# Patient Record
Sex: Male | Born: 1935 | Race: White | Hispanic: No | Smoking: Current every day smoker
Health system: Southern US, Community
[De-identification: ages and names within clinical notes are randomized; demographics above are authoritative.]

## PROBLEM LIST (undated history)

## (undated) ENCOUNTER — Emergency Department: Discharge status: Absent without leave | Payer: Medicare Other

## (undated) DIAGNOSIS — I779 Disorder of arteries and arterioles, unspecified: Secondary | ICD-10-CM

## (undated) DIAGNOSIS — J449 Chronic obstructive pulmonary disease, unspecified: Secondary | ICD-10-CM

## (undated) DIAGNOSIS — K219 Gastro-esophageal reflux disease without esophagitis: Secondary | ICD-10-CM

## (undated) DIAGNOSIS — I471 Supraventricular tachycardia, unspecified: Secondary | ICD-10-CM

## (undated) DIAGNOSIS — I70209 Unspecified atherosclerosis of native arteries of extremities, unspecified extremity: Secondary | ICD-10-CM

## (undated) DIAGNOSIS — I719 Aortic aneurysm of unspecified site, without rupture: Secondary | ICD-10-CM

## (undated) DIAGNOSIS — R51 Headache: Secondary | ICD-10-CM

## (undated) DIAGNOSIS — N400 Enlarged prostate without lower urinary tract symptoms: Secondary | ICD-10-CM

## (undated) DIAGNOSIS — F32A Depression, unspecified: Secondary | ICD-10-CM

## (undated) DIAGNOSIS — I714 Abdominal aortic aneurysm, without rupture: Secondary | ICD-10-CM

## (undated) DIAGNOSIS — Z8673 Personal history of transient ischemic attack (TIA), and cerebral infarction without residual deficits: Secondary | ICD-10-CM

## (undated) DIAGNOSIS — I10 Essential (primary) hypertension: Secondary | ICD-10-CM

## (undated) DIAGNOSIS — I739 Peripheral vascular disease, unspecified: Secondary | ICD-10-CM

## (undated) DIAGNOSIS — I2510 Atherosclerotic heart disease of native coronary artery without angina pectoris: Secondary | ICD-10-CM

## (undated) DIAGNOSIS — F329 Major depressive disorder, single episode, unspecified: Secondary | ICD-10-CM

## (undated) DIAGNOSIS — F419 Anxiety disorder, unspecified: Secondary | ICD-10-CM

## (undated) DIAGNOSIS — H543 Unqualified visual loss, both eyes: Secondary | ICD-10-CM

## (undated) HISTORY — PX: CAROTID ENDARTERECTOMY: SUR193

## (undated) HISTORY — PX: EYE SURGERY: SHX253

## (undated) HISTORY — PX: CHOLECYSTECTOMY: SHX55

## (undated) HISTORY — PX: CORONARY ARTERY BYPASS GRAFT: SHX141

## (undated) HISTORY — PX: TRANSURETHRAL RESECTION OF PROSTATE: SHX73

---

## 1998-06-07 ENCOUNTER — Encounter: Payer: Self-pay | Admitting: *Deleted

## 1998-06-07 ENCOUNTER — Ambulatory Visit (HOSPITAL_COMMUNITY): Admission: RE | Admit: 1998-06-07 | Discharge: 1998-06-07 | Payer: Self-pay | Admitting: *Deleted

## 2001-06-12 ENCOUNTER — Emergency Department (HOSPITAL_COMMUNITY): Admission: EM | Admit: 2001-06-12 | Discharge: 2001-06-12 | Payer: Self-pay | Admitting: *Deleted

## 2002-05-27 ENCOUNTER — Inpatient Hospital Stay (HOSPITAL_COMMUNITY): Admission: AD | Admit: 2002-05-27 | Discharge: 2002-05-30 | Payer: Self-pay | Admitting: Family Medicine

## 2002-05-28 ENCOUNTER — Encounter: Payer: Self-pay | Admitting: Cardiology

## 2003-02-27 ENCOUNTER — Encounter: Payer: Self-pay | Admitting: Family Medicine

## 2003-02-27 ENCOUNTER — Inpatient Hospital Stay (HOSPITAL_COMMUNITY): Admission: AD | Admit: 2003-02-27 | Discharge: 2003-03-06 | Payer: Self-pay | Admitting: Family Medicine

## 2003-03-02 ENCOUNTER — Encounter: Payer: Self-pay | Admitting: Family Medicine

## 2003-11-03 ENCOUNTER — Ambulatory Visit (HOSPITAL_COMMUNITY): Admission: RE | Admit: 2003-11-03 | Discharge: 2003-11-03 | Payer: Self-pay | Admitting: *Deleted

## 2004-10-29 ENCOUNTER — Emergency Department (HOSPITAL_COMMUNITY): Admission: EM | Admit: 2004-10-29 | Discharge: 2004-10-29 | Payer: Self-pay | Admitting: Emergency Medicine

## 2004-11-04 ENCOUNTER — Ambulatory Visit: Payer: Self-pay | Admitting: Internal Medicine

## 2004-11-07 ENCOUNTER — Ambulatory Visit (HOSPITAL_COMMUNITY): Admission: RE | Admit: 2004-11-07 | Discharge: 2004-11-07 | Payer: Self-pay | Admitting: Internal Medicine

## 2004-11-22 ENCOUNTER — Ambulatory Visit: Payer: Self-pay | Admitting: Internal Medicine

## 2004-11-22 ENCOUNTER — Ambulatory Visit (HOSPITAL_COMMUNITY): Admission: RE | Admit: 2004-11-22 | Discharge: 2004-11-22 | Payer: Self-pay | Admitting: Internal Medicine

## 2006-05-01 ENCOUNTER — Ambulatory Visit: Payer: Self-pay | Admitting: Internal Medicine

## 2006-07-13 ENCOUNTER — Emergency Department (HOSPITAL_COMMUNITY): Admission: EM | Admit: 2006-07-13 | Discharge: 2006-07-13 | Payer: Self-pay | Admitting: Emergency Medicine

## 2007-01-31 ENCOUNTER — Ambulatory Visit: Payer: Self-pay | Admitting: *Deleted

## 2007-06-13 ENCOUNTER — Ambulatory Visit (HOSPITAL_COMMUNITY): Admission: RE | Admit: 2007-06-13 | Discharge: 2007-06-13 | Payer: Self-pay | Admitting: Family Medicine

## 2007-07-11 HISTORY — PX: CATARACT EXTRACTION: SUR2

## 2007-07-18 ENCOUNTER — Ambulatory Visit (HOSPITAL_COMMUNITY): Admission: RE | Admit: 2007-07-18 | Discharge: 2007-07-18 | Payer: Self-pay | Admitting: Ophthalmology

## 2008-07-23 ENCOUNTER — Ambulatory Visit: Payer: Self-pay | Admitting: *Deleted

## 2008-08-11 ENCOUNTER — Emergency Department (HOSPITAL_COMMUNITY): Admission: EM | Admit: 2008-08-11 | Discharge: 2008-08-11 | Payer: Self-pay | Admitting: Emergency Medicine

## 2008-08-17 ENCOUNTER — Emergency Department (HOSPITAL_COMMUNITY): Admission: EM | Admit: 2008-08-17 | Discharge: 2008-08-17 | Payer: Self-pay | Admitting: Emergency Medicine

## 2008-08-24 ENCOUNTER — Encounter (INDEPENDENT_AMBULATORY_CARE_PROVIDER_SITE_OTHER): Payer: Self-pay | Admitting: Family Medicine

## 2008-08-24 ENCOUNTER — Ambulatory Visit (HOSPITAL_COMMUNITY): Admission: RE | Admit: 2008-08-24 | Discharge: 2008-08-24 | Payer: Self-pay | Admitting: Family Medicine

## 2008-08-24 ENCOUNTER — Ambulatory Visit: Payer: Self-pay | Admitting: Cardiology

## 2008-08-31 ENCOUNTER — Ambulatory Visit (HOSPITAL_COMMUNITY): Admission: RE | Admit: 2008-08-31 | Discharge: 2008-08-31 | Payer: Self-pay | Admitting: Family Medicine

## 2008-09-25 ENCOUNTER — Encounter (INDEPENDENT_AMBULATORY_CARE_PROVIDER_SITE_OTHER): Payer: Self-pay | Admitting: Diagnostic Radiology

## 2008-09-25 ENCOUNTER — Ambulatory Visit (HOSPITAL_COMMUNITY): Admission: RE | Admit: 2008-09-25 | Discharge: 2008-09-25 | Payer: Self-pay | Admitting: Family Medicine

## 2009-01-21 ENCOUNTER — Ambulatory Visit: Payer: Self-pay | Admitting: *Deleted

## 2009-06-21 ENCOUNTER — Ambulatory Visit (HOSPITAL_COMMUNITY): Admission: RE | Admit: 2009-06-21 | Discharge: 2009-06-21 | Payer: Self-pay | Admitting: Family Medicine

## 2009-06-29 ENCOUNTER — Ambulatory Visit: Payer: Self-pay | Admitting: Vascular Surgery

## 2009-08-17 ENCOUNTER — Ambulatory Visit: Payer: Self-pay | Admitting: Vascular Surgery

## 2009-12-21 ENCOUNTER — Encounter: Admission: RE | Admit: 2009-12-21 | Discharge: 2009-12-21 | Payer: Self-pay | Admitting: Vascular Surgery

## 2009-12-21 ENCOUNTER — Ambulatory Visit: Payer: Self-pay | Admitting: Vascular Surgery

## 2009-12-26 ENCOUNTER — Emergency Department (HOSPITAL_COMMUNITY): Admission: EM | Admit: 2009-12-26 | Discharge: 2009-12-26 | Payer: Self-pay | Admitting: Emergency Medicine

## 2010-01-25 ENCOUNTER — Ambulatory Visit: Payer: Self-pay | Admitting: Vascular Surgery

## 2010-01-28 ENCOUNTER — Ambulatory Visit: Payer: Self-pay | Admitting: Vascular Surgery

## 2010-01-28 ENCOUNTER — Encounter: Payer: Self-pay | Admitting: Vascular Surgery

## 2010-01-28 ENCOUNTER — Inpatient Hospital Stay (HOSPITAL_COMMUNITY): Admission: RE | Admit: 2010-01-28 | Discharge: 2010-01-29 | Payer: Self-pay | Admitting: Vascular Surgery

## 2010-02-22 ENCOUNTER — Ambulatory Visit: Payer: Self-pay | Admitting: Vascular Surgery

## 2010-03-07 ENCOUNTER — Emergency Department (HOSPITAL_COMMUNITY): Admission: EM | Admit: 2010-03-07 | Discharge: 2010-03-07 | Payer: Self-pay | Admitting: Emergency Medicine

## 2010-03-17 DIAGNOSIS — D508 Other iron deficiency anemias: Secondary | ICD-10-CM | POA: Insufficient documentation

## 2010-03-17 DIAGNOSIS — K449 Diaphragmatic hernia without obstruction or gangrene: Secondary | ICD-10-CM | POA: Insufficient documentation

## 2010-03-17 DIAGNOSIS — Z8719 Personal history of other diseases of the digestive system: Secondary | ICD-10-CM

## 2010-05-02 ENCOUNTER — Encounter (INDEPENDENT_AMBULATORY_CARE_PROVIDER_SITE_OTHER): Payer: Self-pay | Admitting: *Deleted

## 2010-06-07 ENCOUNTER — Ambulatory Visit: Payer: Self-pay | Admitting: Vascular Surgery

## 2010-08-11 NOTE — Letter (Signed)
Summary: Generic Letter, Intro to Referring  [facility]  [address]   [place], [place] [address]   Phone: [phone]  Fax: [phone]      May 02, 2010             RE: MCKALE HAFFEY   1928/10/02                 [address]                 [place], [place]  [address]  Dear Kemper Durie,  Mr Aristides Luckey has cancelled his appointments for 9/12 @ 2pm, 9/26 @ 3 pm and was a NO SHOW for today.            Sincerely,    Diana Eves  [facility] Ph: [phone]   Fax: [phone]

## 2010-08-27 ENCOUNTER — Emergency Department (HOSPITAL_COMMUNITY)
Admission: EM | Admit: 2010-08-27 | Discharge: 2010-08-27 | Disposition: A | Discharge status: Home / Self Care | Payer: Medicare Other | Attending: Emergency Medicine | Admitting: Emergency Medicine

## 2010-08-27 DIAGNOSIS — E7800 Pure hypercholesterolemia, unspecified: Secondary | ICD-10-CM | POA: Insufficient documentation

## 2010-08-27 DIAGNOSIS — I10 Essential (primary) hypertension: Secondary | ICD-10-CM | POA: Insufficient documentation

## 2010-08-27 DIAGNOSIS — K5900 Constipation, unspecified: Secondary | ICD-10-CM | POA: Insufficient documentation

## 2010-08-27 DIAGNOSIS — I2510 Atherosclerotic heart disease of native coronary artery without angina pectoris: Secondary | ICD-10-CM | POA: Insufficient documentation

## 2010-08-27 DIAGNOSIS — Z8673 Personal history of transient ischemic attack (TIA), and cerebral infarction without residual deficits: Secondary | ICD-10-CM | POA: Insufficient documentation

## 2010-08-27 DIAGNOSIS — H409 Unspecified glaucoma: Secondary | ICD-10-CM | POA: Insufficient documentation

## 2010-08-27 DIAGNOSIS — Z951 Presence of aortocoronary bypass graft: Secondary | ICD-10-CM | POA: Insufficient documentation

## 2010-09-22 LAB — COMPREHENSIVE METABOLIC PANEL
Alkaline Phosphatase: 70 U/L (ref 39–117)
BUN: 10 mg/dL (ref 6–23)
CO2: 25 mEq/L (ref 19–32)
Chloride: 111 mEq/L (ref 96–112)
Glucose, Bld: 100 mg/dL — ABNORMAL HIGH (ref 70–99)
Potassium: 3.6 mEq/L (ref 3.5–5.1)
Total Bilirubin: 0.6 mg/dL (ref 0.3–1.2)

## 2010-09-22 LAB — CBC
HCT: 34.3 % — ABNORMAL LOW (ref 39.0–52.0)
Hemoglobin: 11.1 g/dL — ABNORMAL LOW (ref 13.0–17.0)
MCV: 85.3 fL (ref 78.0–100.0)
RBC: 4.02 MIL/uL — ABNORMAL LOW (ref 4.22–5.81)
WBC: 7.3 10*3/uL (ref 4.0–10.5)

## 2010-09-22 LAB — URINALYSIS, ROUTINE W REFLEX MICROSCOPIC
Bilirubin Urine: NEGATIVE
Hgb urine dipstick: NEGATIVE
Ketones, ur: NEGATIVE mg/dL
Specific Gravity, Urine: 1.02 (ref 1.005–1.030)
pH: 6 (ref 5.0–8.0)

## 2010-09-22 LAB — DIFFERENTIAL
Basophils Absolute: 0 10*3/uL (ref 0.0–0.1)
Lymphs Abs: 1 10*3/uL (ref 0.7–4.0)
Monocytes Absolute: 0.4 10*3/uL (ref 0.1–1.0)
Neutro Abs: 5.8 10*3/uL (ref 1.7–7.7)

## 2010-09-24 LAB — CBC
HCT: 26.6 % — ABNORMAL LOW (ref 39.0–52.0)
Hemoglobin: 8.5 g/dL — ABNORMAL LOW (ref 13.0–17.0)
MCH: 24.1 pg — ABNORMAL LOW (ref 26.0–34.0)
MCHC: 31 g/dL (ref 30.0–36.0)
MCHC: 32 g/dL (ref 30.0–36.0)
MCV: 69.5 fL — ABNORMAL LOW (ref 78.0–100.0)
Platelets: 141 10*3/uL — ABNORMAL LOW (ref 150–400)
RDW: 22.1 % — ABNORMAL HIGH (ref 11.5–15.5)
RDW: 22.9 % — ABNORMAL HIGH (ref 11.5–15.5)
WBC: 6.4 10*3/uL (ref 4.0–10.5)

## 2010-09-24 LAB — COMPREHENSIVE METABOLIC PANEL
Albumin: 3.8 g/dL (ref 3.5–5.2)
Alkaline Phosphatase: 71 U/L (ref 39–117)
BUN: 13 mg/dL (ref 6–23)
Calcium: 8.7 mg/dL (ref 8.4–10.5)
Potassium: 3.9 mEq/L (ref 3.5–5.1)
Sodium: 137 mEq/L (ref 135–145)
Total Protein: 6.1 g/dL (ref 6.0–8.3)

## 2010-09-24 LAB — CROSSMATCH

## 2010-09-24 LAB — URINALYSIS, ROUTINE W REFLEX MICROSCOPIC
Bilirubin Urine: NEGATIVE
Ketones, ur: NEGATIVE mg/dL
Nitrite: NEGATIVE
Protein, ur: NEGATIVE mg/dL
Urobilinogen, UA: 0.2 mg/dL (ref 0.0–1.0)
pH: 5.5 (ref 5.0–8.0)

## 2010-09-24 LAB — PROTIME-INR
INR: 1.25 (ref 0.00–1.49)
Prothrombin Time: 15.6 seconds — ABNORMAL HIGH (ref 11.6–15.2)

## 2010-09-24 LAB — POCT I-STAT 4, (NA,K, GLUC, HGB,HCT)
Glucose, Bld: 122 mg/dL — ABNORMAL HIGH (ref 70–99)
HCT: 26 % — ABNORMAL LOW (ref 39.0–52.0)
HCT: 27 % — ABNORMAL LOW (ref 39.0–52.0)
Hemoglobin: 8.8 g/dL — ABNORMAL LOW (ref 13.0–17.0)
Sodium: 139 mEq/L (ref 135–145)
Sodium: 141 mEq/L (ref 135–145)

## 2010-09-24 LAB — SURGICAL PCR SCREEN
MRSA, PCR: NEGATIVE
Staphylococcus aureus: NEGATIVE

## 2010-09-24 LAB — BASIC METABOLIC PANEL
BUN: 7 mg/dL (ref 6–23)
CO2: 21 mEq/L (ref 19–32)
Calcium: 8 mg/dL — ABNORMAL LOW (ref 8.4–10.5)
Glucose, Bld: 120 mg/dL — ABNORMAL HIGH (ref 70–99)
Potassium: 3.4 mEq/L — ABNORMAL LOW (ref 3.5–5.1)
Sodium: 139 mEq/L (ref 135–145)

## 2010-10-25 LAB — DIFFERENTIAL
Basophils Absolute: 0 10*3/uL (ref 0.0–0.1)
Lymphocytes Relative: 14 % (ref 12–46)
Lymphocytes Relative: 17 % (ref 12–46)
Lymphs Abs: 1 10*3/uL (ref 0.7–4.0)
Lymphs Abs: 1.3 10*3/uL (ref 0.7–4.0)
Monocytes Absolute: 0.6 10*3/uL (ref 0.1–1.0)
Neutro Abs: 5.5 10*3/uL (ref 1.7–7.7)
Neutrophils Relative %: 78 % — ABNORMAL HIGH (ref 43–77)

## 2010-10-25 LAB — URINALYSIS, ROUTINE W REFLEX MICROSCOPIC
Bilirubin Urine: NEGATIVE
Glucose, UA: NEGATIVE mg/dL
Glucose, UA: NEGATIVE mg/dL
Hgb urine dipstick: NEGATIVE
Leukocytes, UA: NEGATIVE
Nitrite: NEGATIVE
Protein, ur: NEGATIVE mg/dL
Specific Gravity, Urine: 1.02 (ref 1.005–1.030)
Urobilinogen, UA: 0.2 mg/dL (ref 0.0–1.0)
pH: 7 (ref 5.0–8.0)

## 2010-10-25 LAB — COMPREHENSIVE METABOLIC PANEL
Albumin: 3.9 g/dL (ref 3.5–5.2)
BUN: 17 mg/dL (ref 6–23)
Calcium: 9.1 mg/dL (ref 8.4–10.5)
Creatinine, Ser: 0.9 mg/dL (ref 0.4–1.5)
GFR calc Af Amer: >60 mL/min (ref >60)
Total Bilirubin: 0.4 mg/dL (ref 0.3–1.2)
Total Protein: 6.7 g/dL (ref 6.0–8.3)

## 2010-10-25 LAB — BASIC METABOLIC PANEL
BUN: 19 mg/dL (ref 6–23)
Creatinine, Ser: 1.27 mg/dL (ref 0.4–1.5)
GFR calc non Af Amer: 55 mL/min — ABNORMAL LOW (ref >60)
Potassium: 4 mEq/L (ref 3.5–5.1)

## 2010-10-25 LAB — URINE MICROSCOPIC-ADD ON

## 2010-10-25 LAB — CBC
HCT: 32.1 % — ABNORMAL LOW (ref 39.0–52.0)
MCHC: 32.4 g/dL (ref 30.0–36.0)
MCV: 70.9 fL — ABNORMAL LOW (ref 78.0–100.0)
Platelets: 141 10*3/uL — ABNORMAL LOW (ref 150–400)
Platelets: 181 10*3/uL (ref 150–400)
RDW: 20.5 % — ABNORMAL HIGH (ref 11.5–15.5)
WBC: 7.3 10*3/uL (ref 4.0–10.5)

## 2010-11-22 NOTE — Procedures (Signed)
DUPLEX ULTRASOUND OF ABDOMINAL AORTA   INDICATION:  Follow up abdominal aortic aneurysm.   HISTORY:  Diabetes:  No.  Cardiac:  CABG in 1998.  Hypertension:  Yes.  Smoking:  Yes.  Connective Tissue Disorder:  Family History:  No.  Previous Surgery:  No.   DUPLEX EXAM:         AP (cm)                   TRANSVERSE (cm)  Proximal             2.13 cm                   1.99 cm  Mid                  4.6 cm                    4.9 cm  Distal               2.91 cm                   3.28 cm  Right Iliac          0.74 cm                   0.89 cm  Left Iliac           0.95 cm                   0.91 cm   PREVIOUS:  Date:  AP:  4.29  TRANSVERSE:  4.57   IMPRESSION:  There is an abdominal aortic aneurysm with the largest  measurement of 4.6 cm X 4.9 cm.  This is an increase from previous  study.   ___________________________________________  Quita Skye Hart [place], M.D.   CB/MEDQ  D:  06/29/2009  T:  06/30/2009  Job:  [id number]

## 2010-11-22 NOTE — Assessment & Plan Note (Signed)
OFFICE VISIT   [name], [name]  DOB:  1929-03-07                                       06/07/2010  [id number]#:[id number]   The patient returns today for further discussion regarding his  infrarenal abdominal aortic aneurysm.  The aneurysm measures 54 mm in  maximum diameter and he has severe iliac occlusive disease.  He is a  poor candidate for stent graft and will require open repair.  Since I  last saw him 3 months ago he has lost significant weight and also has  lost the remaining vision in his left eye.  He was blind in his right  eye previously.  He has been having some lower abdominal discomfort and  is being evaluated by Dr. Ishmael Holter. McInnis for these problems as well as  urinary frequency.   CHRONIC MEDICAL PROBLEMS:  1. Hypertension.  2. Hyperlipidemia.  3. Coronary artery disease, previous coronary bypass grafting in 1998.  4. Chronic blindness.   SOCIAL HISTORY:  The patient smokes 1-1/2 to 2 packs of cigarettes per  day.  Retired.  Does not use alcohol.   REVIEW OF SYSTEMS:  Continues to deny any chest pain but has a very poor  appetite.  Had lost from 165 he says down to 125 pounds recently.  He is  being evaluated by Dr. Renard Matter for this.  Also complains of headaches,  productive cough, leg pain with walking, history of chest tightness on  occasion, melenic stool, constipation.  All other systems are negative  in review of systems.   PHYSICAL EXAM:  Vital signs:  Blood pressure 122/60, heart rate 80,  respirations 14.  General:  He is a chronically ill, emaciated appearing  male who is in no apparent distress.  He is alert and oriented x3.  Appears to be blind.  HEENT:  Reveals absent vision.  Lungs:  Clear.  No  rhonchi or wheezing.  Cardiovascular:  Regular rhythm, no murmurs.  Carotid pulses 3+, no bruits.  Abdomen:  Soft, nontender with a 5 cm  pulsatile mass in mid epigastrium.  Abdomen:  Scaphoid.  He has 1 to 2+  femoral pulses  bilaterally.   I think at the present time he is much too weak to undergo elective  resection and grafting of his aneurysm and I am not certain that he will  be strong enough to do this in the future.  We will need to follow him  and see what the size of the aneurysm does and his general strength  level.  He will return to see me in 6 months with a duplex scan of his  aneurysm and a carotid duplex to follow up his recent right carotid  surgery.  Weight today is 118 pounds.  He is to be seeing Dr. Renard Matter  for further followup in the near future.     Quita Skye Hart [place], M.D.  Electronically Signed   JDL/MEDQ  D:  06/07/2010  T:  06/07/2010  Job:  [id number]

## 2010-11-22 NOTE — Assessment & Plan Note (Signed)
OFFICE VISIT   [name], [name]  DOB:  1928-08-10                                       02/22/2010  DVVOH#:[id number]   The patient returns today for initial followup regarding his right  carotid endarterectomy which I performed July 22 for severe but  asymptomatic right internal carotid stenosis.  He has done very well  from that standpoint with no neurologic complications.  He has had no  hemiparesis, aphasia, amaurosis fugax, diplopia, blurred vision or  syncope.  He has not been eating well according to the family.  He has  not had any nausea or vomiting but has had some mild abdominal  discomfort periodically but nothing consistent.  He has been taking some  Ensure.   PHYSICAL EXAM:  Today blood pressure 124/66, heart rate 79, respirations  18.  His weight today is 123.7 pounds.  Right neck incision has healed  nicely.  Neurologic exam normal.  Abdomen is soft with no tenderness.  He has a 5 cm pulsatile mass, 2-3+ femoral pulses bilaterally.   I think we should wait a couple of months for him to get some strength  back before proceeding with his aneurysm resection which will require  open surgery and aortobifemoral bypass graft.  I asked the family to get  in touch with Dr. Renard Matter to help with regarding his strength and  appetite preoperatively.  He will return in 2 months for further  examination and hopefully we will be able to schedule his aneurysm  surgery at that time.     Quita Skye Hart [place], M.D.  Electronically Signed   JDL/MEDQ  D:  02/22/2010  T:  02/23/2010  Job:  [id number]

## 2010-11-22 NOTE — Procedures (Signed)
CAROTID DUPLEX EXAM   INDICATION:  Follow-up evaluation of known carotid artery disease.   HISTORY:  Diabetes:  No.  Cardiac:  Coronary artery disease.  Coronary artery bypass graft in 1998  by Dr. Cornelius Moras.  Hypertension:  Yes.  Smoking:  Pack per day for over 60 years.  Previous Surgery:  No.  CV History:  Previous duplex on 01/31/07 revealed 20-39% left ICA  stenosis and 60-79% right ICA stenosis and an occluded left vertebral  artery.  Patient has no vision in his right eye.  Amaurosis Fugax No, Paresthesias No, Hemiparesis No.                                       RIGHT             LEFT  Brachial systolic pressure:         112               118  Brachial Doppler waveforms:         Triphasic         Triphasic  Vertebral direction of flow:        Antegrade         Occluded  DUPLEX VELOCITIES (cm/sec)  CCA peak systolic                   80                80  ECA peak systolic                   111               127  ICA peak systolic                   330 mid           125  ICA end diastolic                   74 mid            30  PLAQUE MORPHOLOGY:                  Calcified, irregular                Mixed  PLAQUE AMOUNT:                      Moderate          Mild  PLAQUE LOCATION:                    Proximal ICA      Proximal-to-mid  ICA   IMPRESSION:  1. Occluded left vertebral artery.  2. 60-79% right internal carotid artery stenosis.  3. 20-39% left internal carotid artery stenosis.  4. No significant change from previous study.   ___________________________________________  P. Liliane Bade, M.D.   MC/MEDQ  D:  07/23/2008  T:  07/24/2008  Job:  [id number]

## 2010-11-22 NOTE — Assessment & Plan Note (Signed)
OFFICE VISIT   [name], CUADROS  DOB:  04-27-1929                                       12/21/2009  UXLKG#:[id number]   The patient returns today for further evaluation of his carotid  occlusive disease and his abdominal aortic aneurysm.  He denies any  active hemispheric or nonhemispheric TIAs, amaurosis fugax, diplopia,  blurred vision or syncope.  He is scheduled to have surgery on his left  eye tomorrow at [facility].  He has no vision in his right eye.  He has not had any abdominal or back symptoms.  Denies any chest pain,  dyspnea on exertion.   CHRONIC MEDICAL PROBLEMS:  1. Hypertension.  2. Hyperlipidemia.  3. Coronary artery disease, previous coronary bypass graft in 1998.  4. Iliac occlusive disease.   SOCIAL HISTORY:  The patient continues to smoke 1-1/2 to 2 packs  cigarettes per day.  He is retired.  Does not use alcohol.   REVIEW OF SYSTEMS:  Review of systems as noted is having no active  cardiac symptoms, chest pain, dyspnea on exertion, PND, orthopnea.   PHYSICAL EXAMINATION:  Blood pressure today is 131/63, heart rate 79,  temperature 98.  Generally he is a chronically ill, thin male who is in  no apparent distress, alert and oriented times 3.  HEENT exam reveals no  vision in the right eye, poor vision in his left eye grossly.  Extraocular muscles intact.  Lungs clear to auscultation.  No wheezing.  Cardiovascular exam reveals a regular rhythm, no murmurs.  Carotid  pulses are 3+ with soft bruit on the right.  Abdomen is soft, nontender  with a pulsatile mass measuring about 5 cm.  Musculoskeletal exam is  free of deformities.  Lower extremity exam reveals 3+ femoral pulses  bilaterally.   Today I ordered carotid duplex exam which revealed progression of  disease on the right side to 80% in severity with mild to moderate left  internal carotid stenosis.  I also ordered a CT angiogram today with  contrast to assess his aneurysm.   The aneurysm now measures 5.3 cm in  maximum diameter and does appear to be a candidate for aortic stent  grafting with a neck measuring about 13-15 mm in length.  Also reveals a  possible mass in the upper pole of the left kidney.   PLAN:  1. A Cardiolite study to see his current cardiac status.  2. Urology consult to evaluate the possible mass in the left upper      pole of his left kidney.  3. He will then need right carotid endarterectomy performed.  4. Treatment of his aneurysm with possible stent grafting and possible      open repair most likely aortic stent graft.   He will return in 4 weeks after the above have been performed.     Quita Skye Hart [place], M.D.  Electronically Signed   JDL/MEDQ  D:  12/21/2009  T:  12/22/2009  Job:  [id number]

## 2010-11-22 NOTE — Assessment & Plan Note (Signed)
OFFICE VISIT   CAMRON, MONDAY  DOB:  September 22, 1928                                       01/31/2007  [id number]#:[id number]   Mr. Christofferson returns to the office today for continued surveillance of his  carotid disease and a small abdominal aortic aneurysm.   Abdominal ultrasound carried out today. This reveals his aneurysm to be  4.2 cm in maximal diameter, mild increase in size, not significant. His  carotid Doppler evaluation reveals minimal left carotid disease. He does  have 60% to 79% right ICA stenosis with peak systolic velocity 328 cm  per second.   Continues to smoke 1 pack of cigarettes per day. History of  hypertension, coronary artery disease, and previous coronary artery  bypass.   Complains of some chronic back pain. No abdominal pain. No visual  disturbance, sensory, or motor deficit.   No carotid bruits. Heart sounds are normal without murmur. Chest is  clear without rales or rhonchi. Abdomen soft and nontender. No masses or  organomegaly. Two + femoral pulses bilaterally.   Mr. Crumpler does show some progression of his carotid disease along with  mild enlargement of his triple A. We will plan follow up again in 6  months with an ultrasound of the abdomen and carotid Doppler evaluation.  He was counseled regarding smoking cessation.   Balinda Quails, M.D.  Electronically Signed   PGH/MEDQ  D:  01/31/2007  T:  02/01/2007  Job:  [id number]   cc:   Angus G. Renard Matter, MD

## 2010-11-22 NOTE — Procedures (Signed)
CAROTID DUPLEX EXAM   INDICATION:  Follow up carotid artery disease.   HISTORY:  Diabetes:  No.  Cardiac:  Coronary artery disease, CABG in 1998 by Dr. Cornelius Moras.  Hypertension:  Yes.  Smoking:  One pack per day for more than 60 years.  Previous Surgery:  CV History:  Amaurosis Fugax, No.  Paresthesias, No.  Hemiparesis, No.                                       RIGHT             LEFT  Brachial systolic pressure:         140.              134.  Brachial Doppler waveforms:           Triphasic         Triphasic  Vertebral direction of flow:        Antegrade.        Not detected.  DUPLEX VELOCITIES (cm/sec)  CCA peak systolic                   65.               83.  ECA peak systolic                   72.               86.  ICA peak systolic                   328 mid.          67.  ICA end diastolic                   84.               29.  PLAQUE MORPHOLOGY:                  Calcified.        Mixed.  PLAQUE AMOUNT:                      Large.            Mild.  PLAQUE LOCATION:                    Mid ICA.          ICA.   IMPRESSION:  1. 20 to 39% left ICA stenosis.  2. 60 to 79% right ICA stenosis with an increase in velocities from      previous exam.  3. Left vertebral artery not detected, probable occlusion.   ___________________________________________  P. Liliane Bade, M.D.   DP/MEDQ  D:  01/31/2007  T:  01/31/2007  Job:  [id number]

## 2010-11-22 NOTE — Procedures (Signed)
CAROTID DUPLEX EXAM   INDICATION:  Followup carotid artery disease   HISTORY:  Diabetes:  no  Cardiac:  CABG in 1998  Hypertension:  yes  Smoking:  yes  Previous Surgery:  no  CV History:  TIA  Amaurosis Fugax  No, Paresthesias No, Hemiparesis  No                                       RIGHT             LEFT  Brachial systolic pressure:         124               110  Brachial Doppler waveforms:         WNL               WNL  Vertebral direction of flow:        Antegrade         Occluded  DUPLEX VELOCITIES (cm/sec)  CCA peak systolic                   103               109  ECA peak systolic                   151               134  ICA peak systolic                   401               177  ICA end diastolic                   94                38  PLAQUE MORPHOLOGY:                  heterogeneous     heterogeneous  PLAQUE AMOUNT:                      Moderate to severe                  Moderate  PLAQUE LOCATION:                    ICA               ICA   IMPRESSION:  1. Right internal carotid artery suggests 60% to 79% stenosis.  2. Left internal carotid artery suggests 49% stenosis.  3. Known occluded left vertebral.   ___________________________________________  Quita Skye. Hart [place], M.D.   CB/MEDQ  D:  12/21/2009  T:  12/21/2009  Job:  [id number]

## 2010-11-22 NOTE — Assessment & Plan Note (Signed)
OFFICE VISIT   [name], [name]  DOB:  Mar 05, 1929                                       01/25/2010  ZOXWR#:[id number]   The patient returns today for further discussion regarding his abdominal  aortic aneurysm and his severe carotid occlusive disease.  He also has a  questionable lesion in the upper pole of his left kidney and he had  urology appointment Dr. Retta Diones yesterday but because of confusion  about the day, the patient did not show up and is rescheduled for late  August.  Denies hematuria.  He also denies any hemiparesis, aphasia,  amaurosis fugax, diplopia, blurred vision or syncope.  He has an 80%  right internal carotid stenosis and an aneurysm greater than 5 cm in  diameter.  The aneurysm is not good candidate for stent grafting because  of the neck being a reverse taper and also external iliac occlusive  disease.  He has no vision in the right eye.  He has had surgery on his  left eye.   CHRONIC MEDICAL PROBLEMS:  1. Hypertension.  2. Hyperlipidemia.  3. Coronary artery disease, previous coronary bypass grafting in 1998.  4. Iliac occlusive disease.  5. Blindness left eye.   SOCIAL HISTORY:  The patient smokes 1-1/2 to 2 packs of cigarettes per  day, is retired.  Does not use alcohol.   REVIEW OF SYSTEMS:  Negative for chest pain, dyspnea on exertion, PND,  orthopnea.  Does have left hip discomfort and discomfort with  ambulation, unclear whether this is claudication or hip joint problems.  Does have muscle pain.   PHYSICAL EXAMINATION:  Blood pressure 132/84, heart rate 76,  respirations 14.  General:  He is a thin elderly male in no apparent  distress, alert and oriented x3.  HEENT:  Exam reveals no vision in the  right eye, EOM on the left intact.  Lungs:  Clear to auscultation.  No  rhonchi or wheezing.  Cardiovascular:  Regular rhythm, no murmurs.  Carotid pulses 3+ with a high-pitched bruit over the right side.  Abdomen:  Soft  with pulsatile mass exceeding 5 cm in diameter.  He has  2+ femoral pulses bilaterally.   He had a Cardiolite performed at [facility] on December 30, 2009,  which revealed a normal ejection fraction and good ventricular function  with no ischemia.   Plan is to proceed a right carotid endarterectomy this Friday, July 22.  Following recovery, we will proceed with an open resection and grafting  of abdominal aortic aneurysm with insertion of aortobifemoral graft.  Risks and benefits of the carotid surgery have been thoroughly discussed  with the patient and wife and would like proceed.     Quita Skye Hart [place], M.D.  Electronically Signed   JDL/MEDQ  D:  01/25/2010  T:  01/26/2010  Job:  [id number]

## 2010-11-22 NOTE — Procedures (Signed)
CAROTID DUPLEX EXAM   INDICATION:  Followup evaluation of known carotid artery disease.   HISTORY:  Diabetes:  No.  Cardiac:  Coronary artery bypass graft in 1998.  Hypertension:  Yes.  Smoking:  Yes.  Previous Surgery:  No.  CV History:  Mini stroke.  Amaurosis Fugax No, Paresthesias No, Hemiparesis No                                       RIGHT             LEFT  Brachial systolic pressure:         138               132  Brachial Doppler waveforms:         WNL               WNL  Vertebral direction of flow:        Antegrade         Occluded  DUPLEX VELOCITIES (cm/sec)  CCA peak systolic                   97                95  ECA peak systolic                   104               122  ICA peak systolic                   339 (mid)         115  ICA end diastolic                   76                32  PLAQUE MORPHOLOGY:                  Calcified         Heterogeneous  PLAQUE AMOUNT:                      Moderate          Mild  PLAQUE LOCATION:                    Bif/ICA           Bif/ICA   IMPRESSION:  1. 60-79% right internal carotid artery stenosis.  2. 20-39% left internal carotid artery stenosis.  3. Occluded left vertebral artery.   ___________________________________________  P. Liliane Bade, M.D.   AC/MEDQ  D:  01/21/2009  T:  01/21/2009  Job:  [id number]

## 2010-11-22 NOTE — Procedures (Signed)
DUPLEX ULTRASOUND OF ABDOMINAL AORTA   INDICATION:  Follow-up evaluation of abdominal aortic aneurysm.   HISTORY:  Diabetes:  No.  Cardiac:  Coronary artery bypass graft in 1998.  Hypertension:  Yes.  Smoking:  Yes.  Connective Tissue Disorder:  Family History:  No.  Previous Surgery:  No.   DUPLEX EXAM:         AP (cm)                   TRANSVERSE (cm)  Proximal             2.35 cm                   2.57 cm  Mid                  4.29 cm                   4.57 cm  Distal               2.98 cm                   3.2 cm  Right Iliac          1.13 cm                   1.04 cm  Left Iliac           1.07 cm                   0.93 cm   PREVIOUS:  Date: 07/23/2008  AP:  1.4  TRANSVERSE:  4.5   IMPRESSION:  There is no significant change in abdominal aortic aneurysm  compared to previous study.    ___________________________________________  P. Liliane Bade, M.D.   AC/MEDQ  D:  01/21/2009  T:  01/21/2009  Job:  [id number]

## 2010-11-22 NOTE — Assessment & Plan Note (Signed)
OFFICE VISIT   [name], [name]  DOB:  [birth year]                                       06/29/2009  ZOXWR#:[id number]   The patient returns today for continued followup regarding his  infrarenal abdominal aortic aneurysm and right carotid occlusive  disease.  He has been followed by Dr. Madilyn Fireman for many years.  He has had  no abdominal symptoms but has had some mild back discomfort which sounds  chronic in nature.  He also describes bilateral hip and thigh  claudication symptoms after walking a half block which is relieved by  rest.   CHRONIC STABLE MEDICAL PROBLEMS:  1. Include hypertension.  2. Hyperlipidemia.  3. Coronary artery disease.  Previous coronary artery bypass grafting      in 1998.  4. Iliac occlusive disease.  5. Negative for diabetes or stroke.  He denies any hemispheric or      nonhemispheric TIAs, amaurosis fugax, diplopia, blurred vision or      syncope.   PAST SURGICAL HISTORY:  Includes cholecystectomy, appendectomy and  coronary artery bypass grafting.   SOCIAL HISTORY:  The patient continues to smoke 1-1/2 to 2 pack of  cigarettes per day, is retired.  Does not use alcohol.   ALLERGIES:  Penicillin, codeine.   REVIEW OF SYSTEMS:  The patient denies any chest pain, dyspnea on  exertion, PND, orthopnea.  No pulmonary problems.  No weight loss,  anorexia.  All other review of systems are negative.   PHYSICAL EXAMINATION:  Blood pressure 135/67, heart rate 88,  respirations 14, temperature 98.  Generally he is a well-developed, well-  nourished male in no apparent distress, alert and oriented x3.  HEENT  exam unremarkable with the exception of very poor vision, has no vision  in the left eye, poor vision in the right eye.  Chest clear to  auscultation.  Cardiovascular exam regular rhythm, no murmurs.  Abdomen  soft, nontender with 4.5 to 5 cm pulsatile mass.  Musculoskeletal exam  reveals no major deformities.  Neurologic exam  normal.  Lower extremity  exam reveals 1-2+ femoral and 2-3+ posterior tibial pulses bilaterally.   I ordered and reviewed a duplex scan today done in the office which  reveals aneurysm to be 4.9 x 4.6 cm in maximum diameter.  Previous  carotid duplex exam 6 months ago I reviewed as well which revealed  approximate 75%-80% right internal carotid stenosis which is  asymptomatic.   PLAN:  The aneurysm does not require treatment at this time.  He will  return in 6 months for carotid duplex exam and also CT angiogram to see  if he is a candidate for aortic stent graft.  I am concerned about  significant iliac occlusive disease which we will also assess at that  time.     Quita Skye Hart [place], M.D.  Electronically Signed   JDL/MEDQ  D:  06/29/2009  T:  06/30/2009  Job:  [id number]

## 2010-11-22 NOTE — Procedures (Signed)
CAROTID DUPLEX EXAM   INDICATION:  Followup carotid disease.   HISTORY:  Diabetes:  No.  Cardiac:  CABG 1998.  Hypertension:  Yes.  Smoking:  Yes.  Previous Surgery:  No.  CV History:  History of TIA.  Amaurosis Fugax Yes No, Paresthesias Yes No, Hemiparesis Yes No                                       RIGHT             LEFT  Brachial systolic pressure:         160               140  Brachial Doppler waveforms:         Triphasic         Triphasic  Vertebral direction of flow:        Antegrade         Occluded  DUPLEX VELOCITIES (cm/sec)  CCA peak systolic                   72                69  ECA peak systolic                   82                100  ICA peak systolic                   338               148  ICA end diastolic                   78                37  PLAQUE MORPHOLOGY:                  Calcified/mixed   Calcified/mixed  PLAQUE AMOUNT:                      Moderate to severe                  Moderate  PLAQUE LOCATION:                    CCA and ICA       CCA and ICA   IMPRESSION:  1. High-grade 60%-79% stenosis noted in the right internal carotid      artery.  2. 40%-59% stenosis noted in the left internal carotid artery.  3. Left vertebral is occluded.  4. Stable to previous study.   ___________________________________________  [name] [place], M.D.   CJ/MEDQ  D:  08/17/2009  T:  08/18/2009  Job:  [id number]

## 2010-11-22 NOTE — Procedures (Signed)
DUPLEX ULTRASOUND OF ABDOMINAL AORTA   INDICATION:  Followup abdominal aortic aneurysm.  The patient is  complaining of increased back pain.   HISTORY:  Diabetes:  No  Cardiac:  Coronary artery disease, CABG in 1998.  Hypertension:  Yes  Smoking:  One pack per day for more than 60 years.  Connective Tissue Disorder:  Family History:  No  Previous Surgery:   DUPLEX EXAM:         AP (cm)                   TRANSVERSE (cm)  Proximal             2.49 cm                   2.19 cm  Mid                  4.25 cm                   3.51 cm  Distal               2.54 cm                   2.66 cm  Right Iliac          0.74 cm  Left Iliac           1.01 cm   PREVIOUS:  Date: 12/21/2005  AP:  3.71  TRANSVERSE:  3.79   IMPRESSION:  Slight increase in measurements of known abdominal aortic  aneurysm.   ___________________________________________  P. Liliane Bade, M.D.   DP/MEDQ  D:  01/31/2007  T:  01/31/2007  Job:  [id number]

## 2010-11-22 NOTE — Procedures (Signed)
DUPLEX ULTRASOUND OF ABDOMINAL AORTA   INDICATION:  Follow-up evaluation of abdominal aortic aneurysm.  Patient  complains of back pain.   HISTORY:  Diabetes:  No.  Cardiac:  Coronary artery bypass graft in 1998.  Hypertension:  Yes.  Smoking:  Pack per day for over 60 years.  Connective Tissue Disorder:  Family History:  Previous Surgery:   DUPLEX EXAM:         AP (cm)                   TRANSVERSE (cm)  Proximal             2.3 cm                    2.5 cm  Mid                  4.1 cm                    4.5 cm  Distal               3.5 cm                    3.5 cm  Right Iliac          1.0 cm                    1.1 cm  Left Iliac           1.0 cm                    0.9 cm   PREVIOUS:  Date: 01/31/07  AP:  4.3  TRANSVERSE:  3.5   IMPRESSION:  Abdominal aortic aneurysm measurements are stable compared  to previous study.   ___________________________________________  P. Liliane Bade, M.D.   MC/MEDQ  D:  07/23/2008  T:  07/24/2008  Job:  [id number]

## 2010-11-25 NOTE — Discharge Summary (Signed)
   NAME:  [name], [name]                            ACCOUNT NO.:  [id number]   MEDICAL RECORD NO.:  [id number]                   PATIENT TYPE:  INP   LOCATION:  4735                                 FACILITY:  [facility]   PHYSICIAN:  Arturo Morton. Riley Kill, M.D.             DATE OF BIRTH:  Aug 11, 1928   DATE OF ADMISSION:  03/05/2003  DATE OF DISCHARGE:  03/06/2003                           DISCHARGE SUMMARY - REFERRING   ADDENDUM:  Please note the following addendum:  Upon further review of the  patient's previous home medication regimen, it was noted that the patient  was recently started on Plavix while at [facility].  He was also  placed on a nicotine patch.  We will therefore continue these two new  medications, and the patient was provided with prescriptions, as well as his  previous home medication regimen, as previously noted.  Additionally, the  patient has been rescheduled to follow up with Dr. Beulaville Bing on  Tuesday, March 24, 2003 at 1:30 p.m.      Gene Serpe, P.A. [facility]                      Thomas D. Riley Kill, M.D.    GS/MEDQ  D:  03/06/2003  T:  03/06/2003  Job:  [id number]

## 2010-11-25 NOTE — Group Therapy Note (Signed)
   NAME:  [name], NUTTING                            ACCOUNT NO.:  [id number]   MEDICAL RECORD NO.:  [id number]                   PATIENT TYPE:  INP   LOCATION:  A211                                 FACILITY:  [facility]   PHYSICIAN:  Angus G. Renard Matter, M.D.              DATE OF BIRTH:  1928/09/26   DATE OF PROCEDURE:  03/02/2003  DATE OF DISCHARGE:                                   PROGRESS NOTE   SUBJECTIVE:  This patient was admitted to the hospital with atypical  headache which has occurred over a period of several days.  He had a CT of  the head which showed evidence of extensive small vessel white matter  ischemic changes, old thalamic and bilateral external capsule infarcts.  The  patient is scheduled for a carotid Doppler ultrasound.  His condition has  remained stable.   OBJECTIVE:  Vital signs:  Blood pressure 101/56, respirations 18, pulse 71,  temperature 98.2.  Lungs:  Clear to P&A.  Heart:  Regular rhythm.  Abdomen:  No palpable organs or masses.  Neurologic:  No focal deficit.   ASSESSMENT:  The patient was admitted with headache.  Also has history of  vascular disease, aortic aneurysm, peripheral vascular disease, history of  coronary artery disease.   PLAN:  Proceed with carotid Doppler ultrasound.  Continue current regimen.                                               Angus G. Renard Matter, M.D.    AGM/MEDQ  D:  03/02/2003  T:  03/02/2003  Job:  [id number]

## 2010-11-25 NOTE — Discharge Summary (Signed)
NAME:  [name], [name]                            ACCOUNT NO.:  [id number]   MEDICAL RECORD NO.:  [id number]                   PATIENT TYPE:  INP   LOCATION:  4731                                 FACILITY:  [facility]   PHYSICIAN:  Angus G. Renard Matter, M.D.              DATE OF BIRTH:  [birth year]   DATE OF ADMISSION:  05/27/2002  DATE OF DISCHARGE:  05/29/2002                                 DISCHARGE SUMMARY   DISCHARGE SUMMARY AND TRANSFER NOTE:   CONDITION ON DISCHARGE:  The patient's condition stable at the time of his  transfer.   DIAGNOSES:  1. Chest pain, unstable angina most likely.  2. History of hypertension.  3. History of hyperlipidemia.  4. Peripheral vascular disease with bilateral femoral bruits.  5. Cardiovascular disease with right carotid bruits.  6. Peripheral vascular insufficiency/carotid occlusive disease with 70-79%     blockage of right internal carotid artery.  7. Aortic aneurysm 0.5 x 4 cm extends to distal aorta.   HISTORY:  A 77 year old white male with coronary artery bypass graft 4 years  prior to this admission while deer hunting developed a tightness in his  chest, pressure, difficulty breathing, did not take any nitroglycerin, it  resolved in about 5 minutes, this happening about three times per month  though his wife says it happens more frequently, apparently it was not  associated with any diaphoresis but the pain did radiate to his left arm.   OBJECTIVE:  VITAL SIGNS:  Blood pressure 130/60, respirations 18, pulse 60,  temperature 98.  HEENT:  Eyes PERRLA.  TMs negative.  Oropharynx benign.  NECK:  Supple; no JVD or thyroid abnormalities.  HEART:  Regular rate and rhythm; no murmurs; no cardiomegaly.  LUNGS:  Clear to P&A.  ABDOMEN:  No palpable organs or masses.  GENITOURINARY:  Negative.   LABORATORY DATA:  Admission CBC - WBC 8300, hemoglobin 15, hematocrit 44.  Chemistries on admission - Sodium 138, potassium 4.1, chloride 105, CO2 27,  glucose 100, BUN 17, creatinine 1.1, calcium 9.2, total protein 6.5, albumin  3.9.  Subsequent chemistries May 28, 2002 - Sodium 140, potassium 3.6,  chloride 106, CO2 26, glucose 95, BUN 14, creatinine 1.  Cardiac enzymes:  CPK on admission 67, CPK-MB 1.3, troponin 0.01.  Subsequent cardiac enzymes:  CPK 68, CPK-MB 1.3, troponin 0.01.  Urinalysis negative.  Lipid profile:  Total cholesterol 122, triglycerides 127, HDL 38, LDL 59.   RADIOGRAPHS ON ADMISSION:  Carotid Doppler ultrasound hemodynamically  significant stenosis of the proximal right internal carotid artery 70-79%  luminal diameter stenosis.  Ultrasound of aorta 4 x 3.5 cm diameter  infrarenal abdominal aortic aneurysm.  Ultrasound lower extremities arterial  atherosclerotic plaque seen bilaterally, particularly at the right common  femoral artery, bilateral gradient of 50% diameter stenosis involving the  common femoral arteries.   ELECTROCARDIOGRAM:  Normal sinus rhythm.   HOSPITAL COURSE:  The patient at the time of his admission was placed on 1/2-  normal saline KVO rate, nasal O2 at 3 liters per minute, Nitrostat 0.4 mg  sublingual p.r.n. chest pain.  He was continued on his home medicines which  consisted of aspirin 325 mg daily, simvastatin 80 mg daily, metoprolol 50 mg  b.i.d., lisinopril 40 mg daily, HCTZ 25 mg daily, felodipine 5 mg daily.  The patient also was started on Lovenox sublingual shortly after admission.  He was seen by cardiology.  Carotid Doppler ultrasound was performed as well  as ultrasound of lower extremities.  There was evidence of peripheral  vascular disease as well as abdominal aortic aneurysm and carotid occlusive  disease.  The patient after 2 days hospitalization was transferred to [facility] for further evaluation and possible cardiac catheterization.  The  patient's condition stable at time of his discharge.                                               Angus G. Renard Matter, M.D.     AGM/MEDQ  D:  07/12/2002  T:  07/13/2002  Job:  [id number]

## 2010-11-25 NOTE — Op Note (Signed)
NAME:  [name], [name]                  ACCOUNT NO.:  [id number]   MEDICAL RECORD NO.:  [id number]          PATIENT TYPE:  AMB   LOCATION:  DAY                           FACILITY:  [facility]   PHYSICIAN:  R. Roetta Sessions, M.D. DATE OF BIRTH:  1929-07-01   DATE OF PROCEDURE:  11/22/2004  DATE OF DISCHARGE:                                 OPERATIVE REPORT   PROCEDURE:  Diagnostic esophagogastroduodenoscopy followed by colonoscopy.   INDICATIONS:  The patient is a 77 year old gentleman with recent left lower  quadrant abdominal pain which is now essentially resolved.  He has a  microcytic anemia.  He has been taking aspirin, Goody powders and Plavix.  CT scan of the abdomen and pelvis on Nov 07, 2004 demonstrated a small  hypertensive cyst upper pole of the left kidney, stable 4 cm AAA.  No other  significant findings. EGD and colonoscopy now being done.  This approach has  been discussed with the patient at length.  The potential risks, benefits  and alternatives have been reviewed, questions answered.  He is agreeable.  Please see documentation in the medical record.   DESCRIPTION OF PROCEDURE:  Oxygen saturation, blood pressure, pulse and  respiration were monitored throughout the entire procedure.  Conscious  sedation with Versed 3 mg IV and Demerol 75 mg IV in divided doses.  The  instrument was the Olympus video chip system.   EGD FINDINGS:  Esophagus:  Examination of the tubular esophagus revealed  distal esophageal erosions just this side of the EG junction.  There was a  non-critical Schatzki's ring.  No Barrett's esophagus.  No further  abnormalities.  The EG junction was easily traversed.   Stomach:  The gastric cavity was emptied and insufflated well with air.  A  thoroughly examination of the gastric mucosa including retroflexion of the  proximal stomach and esophagogastric junction demonstrated a small hiatal  hernia and a couple of small submucosal petechiae.  Pylorus was patent  and  easily traversed.   Duodenum:  Examination of the bulb and second portion was undertaken.  The  bulb was markedly inflamed with swelling, erosions and small ulcerations  were multiple.  They appeared benign. Please see the photos. Otherwise D1  and D2 appeared normal.   THERAPY AND DIAGNOSTIC MANEUVERS:  None.   The patient tolerated the procedure well and was prepared for colonoscopy.   COLONOSCOPY:  Digital rectal exam revealed no abnormalities.   Colon:  Colonic mucosa was surveyed from the rectosigmoid junction to the  left, transverse,  right colon to the area of the appendiceal orifice,  ileocecal valve and cecum. The terminal ileum was intubated at 10 cm.  From  this level the scope was slowly and cautiously withdrawn.  All previously  mentioned mucosal surfaces were again seen.  The patient had the following  abnormalities:  1.  Scattered left-sided diverticula.  2.  Two AVMs in the cecum, one approximately 1 cm.  The other was about 1.5      cm at the base of the cecum, nonbleeding.  Otherwise the colonic mucosa  appeared normal. Terminal ileum mucosa appeared normal.   The patient tolerated both procedures well and was reactive after endoscopy.   IMPRESSION:  Esophagogastroduodenoscopy:  1.  Distal esophageal erosions consistent with erosive reflux esophagitis.  2.  Non-critical Schatzki's ring not manipulated.  3.  Hiatal hernia and submucosal gastric petechiae; otherwise normal gastric      mucosa.  4.  Duodenitis as described above with some areas of small ulcerations;      otherwise normal D1 and D2.   Colonoscopy findings:  1.  Normal rectum.  2.  Left-sided diverticula.  3.  Non-bleeding cecal arteriovenous malformations in otherwise normal      colon, normal terminal ileum.   Essentially the patient has been bleeding from his upper GI tract.   RECOMMENDATIONS:  1.  Stop Goody powders and aspirin.  2.  May continue Plavix only.  3.  Begin  __________ Protonix 40 mg early daily.  4.  Check H pylori serologies.  5.  Diverticulosis literature provided to Mr. Parke.  6.  Follow up with [place] in four weeks.  7.  Follow up on H pylori serologies in the interim.      RMR/MEDQ  D:  11/22/2004  T:  11/22/2004  Job:  [id number]   cc:   Angus G. Renard Matter, MD  [address]  [place]  [place] [address]  Fax: [phone]

## 2010-11-25 NOTE — Group Therapy Note (Signed)
NAME:  [name], [name]                            ACCOUNT NO.:  [id number]   MEDICAL RECORD NO.:  [id number]                   PATIENT TYPE:  INP   LOCATION:  A211                                 FACILITY:  [facility]   PHYSICIAN:  Mila Homer. Sudie Bailey, M.D.           DATE OF BIRTH:  03-Nov-1928   DATE OF PROCEDURE:  03/01/2003  DATE OF DISCHARGE:                                   PROGRESS NOTE   SUBJECTIVE:  He has had no more confusion.  His wife is with him today in  the room and tells me that three days ago, the day prior to this admission,  she found him sitting in the hall looking at the television set with the  remote control device in his hand and not knowing how to use it, although he  had been using it for years.  There was no other abnormality that day, but  then the following day, he did not know or recognize his car or remembering  a trip out with her to town.  At that point, she brought him to Dr. Renard Matter.   OBJECTIVE:  GENERAL:  He is sitting up in bed having lunch.  He is in no  acute distress.  Well-developed, well-nourished and appears to be oriented  and alert today.  VITAL SIGNS:  Temperature 96.8, pulse 73, respirations 20, blood pressure  120/73.  LUNGS:  Clear throughout.  HEART:  Regular rate and rhythm without murmur.  ABDOMEN:  Soft without hepatosplenomegaly or mass.  EXTREMITIES:  No edema of the ankles.  NEUROLOGIC:  Speech is totally normal.  No slurred speech.  Sentence  structure intact.   His B12 level is 384 and his homocystine level 12.30.  His T4 7.4.  TSH  1.29.   ASSESSMENT:  1. Question multi-infarct dementia/transient ischemic attacks.  2. Essential hypertension.  3. Hypercholesterolemia.  4. Chronic anxiety.   PLAN:  1. Continue Plavix 75 mg q.d. with enteric coated aspirin 81 mg q.d.  2. Continue isosorbide mononitrate 20 mg q.d.  3. For his hypertension, amlodipine 5 mg q.d., HCTZ 25 mg q.d., lisinopril     40 mg q.d.  4. Continue  simvastatin 80 mg q.d.  5. For his hypercholesterolemia, Lorazepam 1 mg t.i.d. for chronic anxiety.  6. Methylmalonic acid level was pending as are carotid Dopplers, which will     be done tomorrow.  7. Discussed this in length with the patient and his wife.   Time spent reviewing the patient's electronic medical record, his paper  medical record, getting more history from his wife and the patient,  examining the patient, formulating a plan and dictating a note was 25  minutes.  Mila Homer. Sudie Bailey, M.D.    SDK/MEDQ  D:  03/01/2003  T:  03/02/2003  Job:  [id number]

## 2010-11-25 NOTE — Discharge Summary (Signed)
NAME:  [name], [name]                            ACCOUNT NO.:  [id number]   MEDICAL RECORD NO.:  [id number]                   PATIENT TYPE:  INP   LOCATION:  4731                                 FACILITY:  [facility]   PHYSICIAN:  Jesse Sans. Wall, M.D. [facility]            DATE OF BIRTH:  [birth year]   DATE OF ADMISSION:  05/29/2002  DATE OF DISCHARGE:  05/30/2002                           DISCHARGE SUMMARY - REFERRING   SUMMARY OF HISTORY:  The patient is a 77 year old white male who was  admitted to [facility] with an episode of chest discomfort that  lasted approximately 10 minutes.  He stated the discomfort radiated to his  left arm, unassociated with shortness of breath or diaphoresis.  He felt  that it was similar to his discomfort that he had prior to bypass surgery  three to four years ago.  The discomfort started after he had been exerting  himself physically.  His history is notable for continued tobacco use,  cholecystectomy, three-vessel bypass surgery, hyperlipidemia.   LABORATORY AND ACCESSORY CLINICAL DATA:  Labs are not available from [facility].   Apparently, he had carotid Dopplers done at [facility] that were  also unavailable.   CKs, totals, MBs and troponins were negative for myocardial infarction.   EKG showed a sinus bradycardia, normal sinus rhythm, nonspecific ST-T wave  changes.   HOSPITAL COURSE:  The patient was transferred to [facility] to  undergo cardiac catheterization; this was performed on May 29, 2002 by  Dr. Jonelle Sidle.  According to his progress note, he had an EF of 60-  65%.  He had native three-vessel coronary artery disease, but his LIMA to  the LAD, saphenous vein graft to the OM-1 and saphenous vein graft to the  PDA were all patent.  Dr. Diona Browner started a low dose of Imdur.  By May 30, 2002, he was ambulating without difficulty.  Dr. Charlton Haws did note a  right femoral artery bruit which  he felt was present prior to his cardiac  catheterization.  It was felt that he could be discharged home with followup  in [place] with Dr. Pittston Bing.   DISCHARGE DIAGNOSES:  1. Noncardiac chest discomfort.  2. History as previously.   DISPOSITION:  He is discharged home.   DISCHARGE MEDICATIONS:  He is asked to continue on his home medications  which include:  1. Coated aspirin 325 mg every day.  2. Nitrospan spray as needed.  3. Simvastatin 80 mg q.h.s.  4. Metoprolol 50 mg b.i.d.  5. Felodipine 5 mg every day.  6. Lisinopril 40 mg every day.  7. Hydrochlorothiazide 25 mg every day.  8. Nitroglycerin 0.5 mg as needed.  9. Imdur 30 mg every day.   ACTIVITY:  He was advised no lifting, driving, sexual activity or heavy  exertion for two days.   DIET:  Maintain a low-salt/-fat/-cholesterol diet.   SPECIAL DISCHARGE INSTRUCTIONS:  If he had any problems with his  catheterization site, he will call.  He was advised no smoking of tobacco  products.   FOLLOWUP:  He was asked to call the [facility] office to arrange a followup  appointment with Dr. Dietrich Pates in approximately one month.  At that one-month  appointment, please pull [facility] labs and carotid Dopplers to review.     Joellyn Rued, P.A. [facility]                    Thomas C. Daleen Squibb, M.D. [facility]    EW/MEDQ  D:  05/30/2002  T:  05/30/2002  Job:  [id number]   cc:   Angus G. Renard Matter, M.D.  [address]  [place]  [place] [address]  Fax: [phone]

## 2010-11-25 NOTE — Cardiovascular Report (Signed)
NAME:  Samuel, [name] NO.:  [id number]   MEDICAL RECORD NO.:  [id number]                   PATIENT TYPE:  INP   LOCATION:  4731                                 FACILITY:  [facility]   PHYSICIAN:  Jonelle Sidle, M.D. [facility]        DATE OF BIRTH:  Feb 26, 1929   DATE OF PROCEDURE:  DATE OF DISCHARGE:  05/29/2002                              CARDIAC CATHETERIZATION   CARDIOLOGIST:  Jonelle Sidle, M.D.   __________ CARDIOLOGIST:  [place]kn[place] Bing, M.D.   PRIMARY CARE PHYSICIAN:  Angus G. Renard Matter, M.D.   INDICATIONS FOR PROCEDURE:  The patient is a 77 year old male with a history  of hypertension, dyslipidemia and coronary artery disease status post  coronary artery bypass grafting in 1998 with a LIMA to the LAD, saphenous  vein graft to the obtuse marginal and saphenous vein graft to the posterior  descending branch.  He presents with chest discomfort suggestive of unstable  angina.  He has ruled out for myocardial infarction and is now referred for  coronary angiography to define the coronary and bypass graft anatomy.   PROCEDURES PERFORMED:  1. Left heart catheterization.  2. Selective coronary angiography.  3. Left ventriculography.  4. Bypass graft angiography.   ACCESS AND EQUIPMENT:  The area about the right femoral artery was  anesthestized with lidocaine and a 6 French sheath and placed in the right  femoral artery via the modified Seldinger technique.  Standard preformed 6  [place] and JR4 catheters were used for selective coronary angiography  and a 6 French angle pigtail catheter was used for left heart  catheterization and left ventriculography.  All exchanges were made over a  wire, and the patient tolerated the procedure well without any  complications.   HEMODYNAMICS:  Left ventricle 148/12 mmHg.  Aorta 143/60 mmHg.   ANGIOGRAPHIC FINDINGS:  1. Left main coronary artery has a 30-40% stenosis.  2. Left anterior descending is a  medium caliber vessel with a 70% mid vessel     stenosis and two diagonal branches.  The distal vessel is also diffusely     diseased.  3. The circumflex coronary artery has a 75% proximal stenosis.  4. The right coronary artery is a dominant vessel with fairly diffuse 60-70%     proximal to mid vessel disease and 50% distal disease.  5. The saphenous vein graft to the right coronary artery/posterior     descending branch is patent.  6. The saphenous vein graft to the obtuse marginal branch is patent.  7. The left internal mammary artery graft to the mid left anterior     descending is patent although it is somewhat atretic towards its     insertion.  8. Left ventriculography was performed in the RAO projection and revealed an     ejection fraction of 60-65% without focal wall motion abnormalities and     no mitral regurgitation.  DIAGNOSES:  1. Three vessel coronary artery disease as outlined.  2. Patent bypass grafts as outlined.  3. Left ventricular ejection fraction of 60-65%.   RECOMMENDATIONS:  Would intensify medical therapy and at this point add  Imdur.                                               Jonelle Sidle, M.D. [facility]    SGM/MEDQ  D:  05/29/2002  T:  05/29/2002  Job:  [id number]

## 2010-11-25 NOTE — H&P (Signed)
NAME:  [name], [name]                            ACCOUNT NO.:  [id number]   MEDICAL RECORD NO.:  [id number]                   PATIENT TYPE:  INP   LOCATION:  A211                                 FACILITY:  [facility]   PHYSICIAN:  Angus G. Renard Matter, M.D.              DATE OF BIRTH:  1929/03/13   DATE OF ADMISSION:  02/27/2003  DATE OF DISCHARGE:                                HISTORY & PHYSICAL   CHIEF COMPLAINT:  A 77 year old white male was seen in the office on the day  of admission with a chief complaint of occipital headache which had been  occurring for the last 2-3 days.   The patient today had an episode of mental confusion with memory loss for a  short period of time.  He did not have any type of muscle weakness in either  his arms or legs or dysphagia.  The patient is a two-pack a day cigarette  smoker and has vascular disease with known aortic aneurism, history of  coronary bypass surgery and a history of known blockages in the carotid  arteries.  It is my feeling the patient should be admitted in order to have  a further evaluation by CT scan of his head and carotid doppler ultrasound  done and at a minimum he should be placed on platelet inhibitor such as  Plavix along with aspirin.   FAMILY HISTORY:  See previous records.   SOCIAL HISTORY:  Patient does smoke two packs of cigarettes daily.   PAST MEDICAL AND SURGICAL HISTORY:  1. The patient has had coronary bypass surgery, four vessel disease,     approximately eight years ago.  2. He does have a history of hypertension.  3. Peripheral vascular insufficiency.  4. Known aortic aneurism.  For the past several years he has seen Dr. Madilyn Fireman     in [place] for this.  Aortic aneurism measures 3-cm.  5. He does have dyslipidemia.  6. He also has a history of cholecystectomy.  7. Appendectomy.  8. Glaucoma.  9. Previous CVA 10-11 years ago.   ALLERGIES:  1. PENICILLIN  2. CODEINE.   MEDICATIONS:  1. Zocor 80 mg  daily.  2. Lisinopril 40 mg daily.  3. Norvasc 5 mg daily.  4. HCTZ 25 mg daily.  5. Metoprolol 50 mg daily.  6. Isosorbide 30 mg daily.  7. Aspirin one daily.   REVIEW OF SYSTEMS:  HEENT:  Patient has had headache over the past few days,  mostly in the occipital area of the head.  CARDIOPULMONARY:  No dyspnea, no  chest pain.  GI:  No bowel irregularity or bleeding.  GU:  Patient has had  intermittent problems with voiding.  He has difficulty initiating urinary  stream and decreased size and force of urinary stream, nocturia.   PHYSICAL EXAMINATION:  GENERAL:  Alert, white male.  VITAL SIGNS:  Blood pressure 140/80, pulse  60, respirations 18, temp 98.  HEENT:  Eyes, PERRLA.  TMs negative.  Oropharynx benign.  NECK:  Supple.  No JVD or thyroid abnormalities.  No murmurs heard over  carotids.  HEART:  Regular rhythm.  No murmurs, no cardiomegaly.  Sternotomy incision  over anterior chest.  LUNGS:  Clear to P&A.  ABDOMEN:  No palpable organs or masses.  No organomegaly.  GENITALIA:  Normal.  Prostate slightly enlarged.  EXTREMITIES:  Free of edema.  NEUROLOGIC:  No focal deficit.  No motor weakness or sensory disturbance in  his extremities.   DIAGNOSES:  1. Transient ischemic attack or impending cerebrovascular accident.  2. History of coronary artery disease with previous grafts.  3. Dyslipidemia.  4. Hypertension.  5. Benign prostatic hypertrophy.  6. Prostatism.                                               Angus G. Renard Matter, M.D.    AGM/MEDQ  D:  02/27/2003  T:  02/27/2003  Job:  [id number]

## 2010-11-25 NOTE — Discharge Summary (Signed)
NAME:  CYLUS, DOUVILLE                            ACCOUNT NO.:  [id number]   MEDICAL RECORD NO.:  [id number]                   PATIENT TYPE:  INP   LOCATION:  4735                                 FACILITY:  [facility]   PHYSICIAN:  Vida Roller, M.D.                DATE OF BIRTH:  06-18-1929   DATE OF ADMISSION:  03/05/2003  DATE OF DISCHARGE:  03/06/2003                           DISCHARGE SUMMARY - REFERRING   PROCEDURES:  Coronary angiogram on March 05, 2003.   REASON FOR ADMISSION:  Please refer to Dr. Tinnie Gens Hardin's dictated  consultation note of March 03, 2003.   LABORATORY DATA:  Sodium 139, potassium 4.1, glucose 133, BUN 19, creatinine  1.1.  INR 1.0.  WBC 69, hemoglobin 14.3, hematocrit 41.3, platelets 103.   HOSPITAL COURSE:  Following initial presentation to [facility] for  evaluation and management of possible stroke, the patient was referred to  Vida Roller, M.D., for evaluation of asymptomatic nonsustained  ventricular tachycardia.  Not chest pain or dyspnea was reported.  The  patient has known coronary artery disease with previous bypass surgery in  1998 and known preserved left ventricular function.  Carotid ultrasonography  at [facility] was notable for a 60-69% right ICA lesion with no flow  identified in the left vertebral artery.  Initial serial cardiac markers  were normal.  Chest x-ray showed COPD with no evidence of heart failure.   Following transfer, the patient underwent coronary angiography performed by  Salvadore Farber, M.D. (see report for full details), revealing widely  patent grafts and mild LV dysfunction (EF 53%) with mild inferior  hypokinesis.   Specifically, there was wide patency of the LIMA-LAD, SVG-obtuse marginal,  and SVG-RCA grafts.  No significant disease distal to the anastomoses was  noted.   Dr. Samule Ohm noted that the ischemia noted on the recent stress test at [facility] was due to the diagonal branch (see  catheterization report for  details).  Although he felt that this would be amenable to PCI, he felt that  it was not indicated given the absence of any chest pain and the patient's  known dementia.   Dr. Samule Ohm therefore recommended continuing medical therapy:  The patient  was started on Toprol 50 mg daily.   Dr. Samule Ohm also noted that there was no indication for defibrillator  placement in the absence of syncope and an EF greater than 40%.   At the time of discharge, arrangements were made for the patient to follow  up with P. Liliane Bade, M.D., whom he has seen in the past, for further  evaluation of cerebrovascular disease.   DISCHARGE MEDICATIONS:  1. Toprol XL 50 mg daily (new).  2. Coated aspirin 81 mg daily.  3. Plavix 75 mg daily.  4. Zocor 80 mg daily.  5. Lisinopril 40 mg daily.  6. Norvasc 5 mg daily.  7.  Hydrochlorothiazide 25 mg daily.  8. Isosorbide 30 mg daily.  9. Nitrostat 0.4 mg p.r.n.   SPECIAL INSTRUCTIONS:  The patient is to stop taking Lopressor.  Stop  smoking.   ACTIVITY:  No heavy lifting/driving x 2 days.   DIET:  Maintain a low-fat/cholesterol diet.   WOUND CARE:  Call the office if there is any bleeding/swelling of the groin.   FOLLOWUP:  Follow up with Balinda Quails, M.D., on Monday, March 09, 2003,  at 10:15 a.m.  Follow up with Vida Roller, M.D., on Wednesday, March 25, 2003, at 2:45 p.m. in [place], [place].   DISCHARGE DIAGNOSES:  1. Known coronary artery disease/abnormal dobutamine Cardiolite.     a. Widely patent grafts on coronary angiogram on March 05, 2003.     b. Preserved left ventricular function (52%).     c. Status post coronary artery bypass graft in 1998.  2. Nonsustained ventricular tachycardia.  3. Cerebrovascular disease.     a. 60-69% right internal carotid artery; no left vertebral artery flow.  4. History of abdominal aortic aneurysm.  5. Status post remote stroke.  6. Hypertension.  7.  Dyslipidemia.  8. Mild thrombocytopenia.  9. Tobacco.      Gene Serpe, P.A. [facility]                      Vida Roller, M.D.    GS/MEDQ  D:  03/06/2003  T:  03/06/2003  Job:  [id number]   cc:   Angus G. Renard Matter, M.D.  [address]  [place]  [place] [address]  Fax: [phone]   P. Liliane Bade, M.D.  [address]  [place]  [place] [address]  Fax: [phone]

## 2010-11-25 NOTE — H&P (Signed)
NAME:  [name], [name]                            ACCOUNT NO.:  [id number]   MEDICAL RECORD NO.:  [id number]                   PATIENT TYPE:  INP   LOCATION:  A214                                 FACILITY:  [facility]   PHYSICIAN:  Angus G. Renard Matter, M.D.              DATE OF BIRTH:  [birth year]   DATE OF ADMISSION:  05/27/2002  DATE OF DISCHARGE:                                HISTORY & PHYSICAL   HISTORY OF PRESENT ILLNESS:  The patient is a 77 year old white male  admitted with a history of an episode of chest pain this a.m. which lasted  approximately 10 minutes.  The patient states that the pain radiated to his  left arm, was not associated with dyspnea or diaphoresis but was very  similar to the pain that he had been experiencing prior to his coronary  bypass surgery some three to four years ago.  Apparently, this pain came  after the patient had been exerting himself physically.  The patient does  have a history of having had episodes of chest pain in the more recent past  which did not last as long.  The patient did have a prescription for  nitroglycerin but failed to use it.  The patient was admitted after having  talked with cardiology for possible unstable angina.   SOCIAL HISTORY:  The patient still smokes approximately a pack a day of  cigarettes.   FAMILY HISTORY:  See previous record.   PAST SURGICAL HISTORY:  1. History of remote appendectomy.  2. Cholecystectomy.  3. Coronary bypass surgery three or four years ago in [place], four-     vessel disease.   MEDICATIONS:  1. Nitrospan.  2. Simvastatin 80 mg daily.  3. Metoprolol 50 mg b.i.d.  4. Felodipine 5 mg daily.  5. Aspirin daily.  6. Lisinopril 40 mg daily.  7. HCTZ 25 mg.   ALLERGIES:  No known allergies.   REVIEW OF SYSTEMS:  HEENT:  Negative.  CARDIOPULMONARY:  The patient has had  recent anterior chest pain.  No dyspnea.  GASTROINTESTINAL:  No bowel  irregularity or bleeding.  GENITOURINARY:  No dysuria  or hematuria.   PHYSICAL EXAMINATION:  GENERAL:  Alert white male.  VITAL SIGNS:  Blood pressure 130/60, pulse 60, respirations 18, temperature  98.  HEENT:  Eyes, PERRLA.  TMs negative.  Oropharynx benign.  NECK:  Supple.  No JVD or thyroid abnormalities.  HEART:  Regular rhythm.  No murmurs.  No cardiomegaly.  LUNGS:  Clear to P&A.  ABDOMEN:  No palpable organs or masses.  GENITOURINARY:  Negative.   DIAGNOSES:  1. Chest pain.  2. Coronary artery disease with unstable angina.  Angus G. Renard Matter, M.D.    AGM/MEDQ  D:  05/27/2002  T:  05/27/2002  Job:  [id number]

## 2010-11-25 NOTE — Procedures (Signed)
   NAME:  [name], [name]                            ACCOUNT NO.:  [id number]   MEDICAL RECORD NO.:  [id number]                   PATIENT TYPE:  INP   LOCATION:  A211                                 FACILITY:  [facility]   PHYSICIAN:  Vida Roller, M.D.                DATE OF BIRTH:  24-Mar-1929   DATE OF PROCEDURE:  DATE OF DISCHARGE:                                  ECHOCARDIOGRAM   TAPE NUMBER:  [id number]   TAPE COUNT:  [phone]   CLINICAL INFORMATION:  This is a 77 year old man with a history of coronary  artery disease, status post bypass surgery who comes in with atypical  symptoms and did not sustain ventricular tachycardia.   TECHNICAL QUALITY:  Limited.   M-MODE TRACINGS:  1. The aorta is 37 mm.  2. The left atrium is 32 mm.  3. The septum is 14 mm.  4. The left ventricular posterior wall is 12 mm.  5. The left ventricular diastolic dimension is 39 mm.  6. The left ventricular systolic dimension is 29 mm.   2-D AND DOPPLER IMAGING:  1. The left ventricle is normal size with normal left ventricular systolic     function.  Estimated ejection fraction is 55-60%.  There were no obvious     wall motion abnormalities seen.  Diastolic function appears to be mildly     abnormal.  There is mild concentric left ventricular hypertrophy.  2. The right ventricle is normal size with normal systolic function.  3. Both atria appear to be normal size.  4. The aortic valve is sclerotic with no evidence of stenosis or     regurgitation.  5. The mitral valve is morphologically unremarkable with only mild annular     calcification.  There is no stenosis.  There is trivial insufficiency.  6. The tricuspid valve is not well seen.  7. The pulmonic valve is not well seen.  8. The ascending aorta appears to be enlarged in some views, although not     well studied.  9. The inferior vena cava appears to be normal size.  10.      The pericardial structures appear normal.                       Vida Roller, M.D.   JH/MEDQ  D:  03/03/2003  T:  03/04/2003  Job:  [id number]

## 2010-11-25 NOTE — Assessment & Plan Note (Signed)
OFFICE VISIT   CESAREO, VICKREY  DOB:  09/12/1928                                       12/27/2009  ZOXWR#:[id number]   Today I reviewed the CT angiogram done on the patient with Trena Platt  from [place].  There are 2 concerning aspects of his scan for stent  grafting.  One is a 15-mm neck with reverse taper, and the second is  significant external iliac occlusive disease.  If he is not an operative  candidate, stent grafting could be attempted.  I think his best plan  would be an open repair with aortobifemoral bypass graft based on CT  angiogram.  He will be returning in the next several weeks for further  discussion.     Quita Skye Hart [place], M.D.  Electronically Signed   JDL/MEDQ  D:  12/27/2009  T:  12/28/2009  Job:  [id number]

## 2010-11-25 NOTE — Group Therapy Note (Signed)
   NAME:  [name], [name]                            ACCOUNT NO.:  [id number]   MEDICAL RECORD NO.:  [id number]                   PATIENT TYPE:  INP   LOCATION:  A214                                 FACILITY:  [facility]   PHYSICIAN:  Angus G. Renard Matter, M.D.              DATE OF BIRTH:  1929-04-20   DATE OF PROCEDURE:  05/28/2002  DATE OF DISCHARGE:                                   PROGRESS NOTE   SUBJECTIVE:  The patient had a more comfortable night.  He did not have any  further chest pain.  The patient was admitted with anterior chest pain.  He  has a prior history of coronary bypass surgery and possible coronary  syndrome.   OBJECTIVE:  VITAL SIGNS: Blood pressure 113/51, respirations 20, pulse 55,  temperature 98.  LUNGS: Clear to P&A.  HEART: Regular rhythm.  ABDOMEN:  No palpable organs or masses.   ASSESSMENT:  The patient was admitted to the hospital with coronary syndrome  most likely.   PLAN:  Proceed with further workup, possible Cardiolite stress test.                                               Angus G. Renard Matter, M.D.    AGM/MEDQ  D:  05/28/2002  T:  05/28/2002  Job:  [id number]

## 2010-11-25 NOTE — Group Therapy Note (Signed)
   NAME:  [name], [name]                            ACCOUNT NO.:  [id number]   MEDICAL RECORD NO.:  [id number]                   PATIENT TYPE:  INP   LOCATION:  A211                                 FACILITY:  [facility]   PHYSICIAN:  Angus G. Renard Matter, M.D.              DATE OF BIRTH:  1929-01-03   DATE OF PROCEDURE:  03/03/2003  DATE OF DISCHARGE:                                   PROGRESS NOTE   SUBJECTIVE:  This patient had short run of ventricular tachycardia this  morning.  He has had a CT of the head which showed evidence of extensive  small vessel white matter ischemic changes, old thalamic and bilateral  external capsule infarcts.  The patient had carotid Doppler which shows  approximately 69% obstruction of the right internal carotid artery.   OBJECTIVE:  Vital signs:  Blood pressure 84/48, respirations 18, pulse 80,  temperature 97.2.  Heart:  Regular rhythm.  Lungs:  Clear to P&A.  Abdomen:  No palpable organs or masses.   ASSESSMENT:  The patient was admitted to the hospital with headache.  He has  a history of vascular disease, aortic aneurysm, peripheral vascular disease,  coronary artery disease; does have carotid occlusive disease.   PLAN:  Obtain cardiology consult.  Continue current regimen.                                               Angus G. Renard Matter, M.D.    AGM/MEDQ  D:  03/03/2003  T:  03/03/2003  Job:  [id number]

## 2010-11-25 NOTE — Discharge Summary (Signed)
NAME:  [name], [name]                            ACCOUNT NO.:  [id number]   MEDICAL RECORD NO.:  [id number]                   PATIENT TYPE:  INP   LOCATION:  4735                                 FACILITY:  [facility]   PHYSICIAN:  Angus G. Renard Matter, M.D.              DATE OF BIRTH:  07-02-1929   DATE OF ADMISSION:  02/27/2003  DATE OF DISCHARGE:  03/05/2003                                 DISCHARGE SUMMARY   This 77 year old white male admitted February 27, 2003, discharged March 05, 2003, 6-days hospitalization.   DIAGNOSES:  1. Transient ischemic attack.  2. Previous cerebrovascular accident.  3. Aortic aneurysm.  4. Carotid occlusive disease.  5. Coronary artery disease.  6. Hypertension.  7. History of prostatism.  8. Benign prostatic hypertrophy.  9. Tobacco abuse.   CONDITION:  Stable at the time of transfer.   This 77 year old white male was seen in the office on the office on the day  of admission with the chief complaint being occipital headache which had  been occurring for the past 2-3 days.  The patient had an episode of mental  confusion with memory loss for a short period of time.  He did not have any  type of muscle weakness in either of his arms, legs or dysphagia.  The  patient is a 2 pack per day cigarette smoker and has a vascular disease with  known aortic aneurysm, history of coronary artery bypass surgery and history  of known blockages in carotid arteries.  He was admitted in order to further  evaluate his head with a CT, carotid Doppler ultrasound and to be placed on  platelet inhibitor such as Plavix along with aspirin.   PHYSICAL EXAMINATION:  VITAL SIGNS:  Blood pressure 140/80, pulse 60,  respirations 18, temperature 98.  HEENT:  Eyes PERRLA.  TMs negative.  Oropharynx benign.  NECK:  Supple.  No JVD or thyroid abnormalities.  HEART:  Regular rhythm.  No murmurs, no cardiomegaly.  CHEST: Sternotomy incision over the anterior chest.  LUNGS:  Clear to  P&A.  ABDOMEN:  No palpable organs or masses.  No organomegaly.  GENITALIA:  Normal.  EXTREMITIES:  Free of edema.  NEUROLOGICAL: No focal deficit.  No motor weakness or sensory disturbance in  extremities.   LABORATORY DATA:  Admission CBC: WBC 7700, hemoglobin 13, hematocrit 37.7.  Chemistries: Sodium 137, potassium 4.1, chloride 105, CO2 28, glucose 109.  BUN 13, creatinine 1.1, calcium 9.1, total protein 6.4, albumin 3.7.  Liver  enzymes: SGOT 17, SGPT 15, alkaline phosphatase 77, bilirubin 0.6.  Cardiac  enzymes: CPK 33, CPK/MB 0.7, troponin less than 0.01.  Lipid profile:  Cholesterol 133, triglycerides 125, HDL 43, LDL 65, methylmalonic acid 0.46,  homocystine 12.30. X-rays:  CT of the head mild-to-moderate diffuse cerebral  and cerebellar atrophy.  Extensive small vessel white matter ischemic  changes, both hemispheres.  Old  thalamic, bilateral external capsular  infarcts. Carotid Doppler ultrasound: Bilateral carotid bifurcation,  proximal ICA plaque, much more extensive on the right than the left; 60-69%  diameter stenosis; no flow detected in left vertebral artery.  Chest x-ray:  COPD, no acute abnormality.  EKG sinus rhythm, ventricular premature  complex.  Echocardiogram: Ejection fraction 54%.  Mild ischemia, distal  anterior wall in apex.   HOSPITAL COURSE:  The patient, at the time of this admission was placed on  IV fluids half-normal saline at [facility] rate.  Vital signs were monitored q.i.d.  and neuro checks were monitored.  Placed on nasal O2 at 2 liters.  He was  continued on Zocor 80 mg daily, lisinopril 40 mg daily, Norvasc 5 mg daily,  HCTZ 25 mg daily, metoprolol 50 mg daily, isosorbide 20 mg daily, aspirin 81  mg daily, Ativan 1 mg t.i.d., Habitrol patch was applied.  The patient was  not compliant with recommendation that he stop smoking.  He did have head CT  done which showed evidence of mild-to-moderate diffuse cerebral and  cerebellar atrophy, extensive small  vessel white matter ischemic changes.  Carotid Doppler ultrasound showed plaque formation significant on right and  left in the range of 60-69% diameter stenosis.  The patient was seen by  cardiologist and evaluated.  It was felt that he should have further workup  and possible left heart catheterization.  He has multi-infarct dementia, and  had some nonsustained ventricular tachycardia.  The patient was transferred  to [facility] on the sixth hospital day for cardiac catheterization and  further evaluation.                                                Angus G. Renard Matter, M.D.    AGM/MEDQ  D:  03/11/2003  T:  03/11/2003  Job:  [id number]

## 2010-11-25 NOTE — Consult Note (Signed)
NAME:  [name], [name]                            ACCOUNT NO.:  [id number]   MEDICAL RECORD NO.:  [id number]                   PATIENT TYPE:  INP   LOCATION:  A214                                 FACILITY:  [facility]   PHYSICIAN:  Maisie Fus C. Wall, M.D. [facility]            DATE OF BIRTH:  1928-10-24   DATE OF CONSULTATION:  05/28/2002  DATE OF DISCHARGE:                                   CONSULTATION   CHIEF COMPLAINT:  Chest tightness, pressure, and shortness of breath while  deer hunting yesterday.   HISTORY OF PRESENT ILLNESS:  The patient is a 77 year old married white male  with coronary bypass grafting around four years ago.  While deer hunting  yesterday, he developed the above symptoms.  He did not take any  nitroglycerin.  It resolved in about five minutes.  This is happening about  three times per month, though his wife says it happens more frequently.   PAST MEDICAL HISTORY:  Significant for hypertension, hyperlipidemia.   PAST SURGICAL HISTORY:  He has had a cholecystectomy and appendectomy.   SOCIAL HISTORY:  He lives with his wife.  He has two children.  He smokes a  pack per day and has for 60 years.  He does not drink.  He eats a lot of  fatty foods, in fact this morning was having a [employer] biscuit.   ALLERGIES:  1. CODEINE.  2. PENICILLIN.   CURRENT MEDICATIONS:  1. Zocor 80 mg a day.  2. Metoprolol 150 b.i.d.  3. Felodipine 5 mg a day.  4. Aspirin 1 q.d.  5. Lisinopril 40 mg a day.  6. HCTZ 25 mg a day.   FAMILY HISTORY:  Unknown except for a brother who died of an MI.   REVIEW OF SYSTEMS:  Noncontributory and otherwise negative to the HPI.   PHYSICAL EXAMINATION:  GENERAL:  He is a well-developed, somewhat disheveled  white male in no acute distress but he is very pleasant.  His neurologic  exam is grossly intact.  VITAL SIGNS:  His blood pressure is 136/51.  His pulse is 73 and regular.  Respiratory rate is 20.  He is afebrile.  HEENT:  Unremarkable except  for excessive wrinkles in his face.  NECK:  A right carotid bruit with good upstrokes bilaterally.  There is no  obvious JVD.  Trachea is midline.  There is no obvious lymphadenopathy.  CARDIOVASCULAR:  A soft systolic murmur along the left sternal border.  His  S2 does split.  He has an S4.  LUNGS:  Decreased breath sounds in both bases with coarse rhonchi in the  right base.  ABDOMEN:  Soft with good bowel sounds.  There was no obvious hepatomegalia.  EXTREMITIES:  Decreased dorsalis pedis pulses bilaterally.  He has bilateral  femoral bruits with a 2/4 left femoral pulse and a 3/4 right femoral pulse.   LABORATORY DATA:  Chest  x-ray is pending.  EKG shows nonspecific ST segment  changes with small inferior Q's but no acute changes.   Hemoglobin is 15, platelets 159, white count is normal.  Potassium is 3.6,  creatinine 1.0.  CPK-MB and troponin negative x2.   ASSESSMENT:  1. Coronary artery disease with episodes of what sounds like unstable     angina.  He was ruled out for myocardial infarction but needs a     diagnostic catheterization and possible intervention.  2. Hypertension.  3. Chronic obstructive lung disease with continued tobacco use.  4. Hyperlipidemia.  5. Peripheral vascular disease with bilateral femoral bruits.  6. Cerebrovascular disease with a right carotid bruit.   PLAN:  1. Begin Lovenox.  2. Continue current medications.  3. Transfer to [facility] in the morning for left catheterization and grafts.  4. Fasting lipid panel.  5. Carotid Dopplers.  6. Peripheral arterial Dopplers.                                               Thomas C. Daleen Squibb, M.D. [facility]    TCW/MEDQ  D:  05/28/2002  T:  05/28/2002  Job:  [id number]   cc:   Angus G. Renard Matter, M.D.  [address]  [place]  [place] [address]  Fax: [phone]   Gridley Bing, M.D. [facility]  [address]  [place]  [place] [address]  Fax: [phone]

## 2010-11-25 NOTE — Group Therapy Note (Signed)
NAME:  ANDREA, COLGLAZIER                            ACCOUNT NO.:  [id number]   MEDICAL RECORD NO.:  [id number]                   PATIENT TYPE:  INP   LOCATION:  A211                                 FACILITY:  [facility]   PHYSICIAN:  Mila Homer. Sudie Bailey, M.D.           DATE OF BIRTH:  [birth year]   DATE OF PROCEDURE:  DATE OF DISCHARGE:                                   PROGRESS NOTE   SUBJECTIVE:  This 77 year old was admitted to the hospital yesterday by Dr.  Renard Matter.  He had been doing well but was sitting in his living room looking  out towards his carport and had to ask his wife whose car it was, even  though he had not only bought this car recently but had been riding in it.  He apparently initially totally forgot a trip of an hour or two he had taken  with his wife into town.  Several hours later he remembered fragments of it.  He had no other symptoms.  He stated he feels like he is doing well since  yesterday.   OBJECTIVE:  VITAL SIGNS:  Temperature 97 degrees, pulse 73, respiratory rate  16, blood pressure 121/62.  GENERAL:  He is sitting up in bed.  He is oriented and alert.  I asked him  questions involving the date, place, and other than thinking he was in  [facility], he was totally correct.  He could not remember the month  but knew it was summertime.  He knew he was in [place] and in [place].  LUNGS:  His lungs were clear throughout and moving air well.  HEART:  Regular rhythm and rate of about 80.  ABDOMEN:  Soft without tenderness.  EXTREMITIES:  There was no edema of the ankles.   LABORATORY DATA:  Today his CBC showed a white cell count of 7700, H&H 13.0  and 37.7, platelet count 122,000.   He had a head CT when he came in which showed mild to moderate diffuse  cerebral and cerebellar atrophy as well as extensive small vessel white  matter ischemic changes in both cerebral hemispheres.  Old left thalamic and  bilateral external capsule infarcts.   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   PLAN:  Continue with the Plavix 75 mg daily ordered by Dr. Renard Matter.  He is  also on isosorbide mononitrate 20 mg daily, enteric coated aspirin 81 mg  daily, amlodipine 5 mg daily, HCTZ 25 mg daily, lisinopril 40 mg daily,  lorazepam 1 mg t.i.d., and simvastatin 80 mg daily.  Other diagnoses include  essential hypertension and hypercholesterolemia.  Assume of this problem is  ischemic but we will also rule out the cause of reversal dementia with a T4,  TSH, B12, methylmalonic acid level, and homocysteine level.  Mila Homer. Sudie Bailey, M.D.    SDK/MEDQ  D:  02/28/2003  T:  02/28/2003  Job:  [id number]

## 2010-11-25 NOTE — Consult Note (Signed)
NAME:  [name], [name]                            ACCOUNT NO.:  [id number]   MEDICAL RECORD NO.:  [id number]                   PATIENT TYPE:  INP   LOCATION:  A211                                 FACILITY:  [facility]   PHYSICIAN:  Vida Roller, M.D.                DATE OF BIRTH:  02/18/1929   DATE OF CONSULTATION:  03/03/2003  DATE OF DISCHARGE:                                   CONSULTATION   CARDIOLOGIST:  [place] Bing, M.D.   REASON FOR CONSULTATION:  [name] is a 77 year old man who is followed by  my colleague Dr. [place] Bing in the cardiology office for coronary  artery disease status post bypass surgery.  He was admitted to the hospital  at [facility] for confusion, memory loss, and was evaluated for possible  CVA.  He had a head CT which revealed small vessel white matter ischemic  changes, bilateral cerebral hemispheric atrophy, old infarcts, and mild-to-  moderate cerebellar atrophy.  His carotid Doppler showed right internal  carotid stenosis in the 60-69% range.  On telemetry while here in the  hospital, he had an episode of nonsustained ventricular tachycardia which  was asymptomatic.  He denies any chest pain, shortness of breath.  The  memory changes are quite concerning to him as well as the confusion, but  overall from a cardiovascular standpoint, he has been reasonably stable.   PAST MEDICAL HISTORY:  1. Coronary artery disease status post myocardial infarction in 1998 and     bypass surgery at that time where he received a LIMA to his LAD, a     saphenous vein graft to his right coronary artery, a saphenous vein graft     to his obtuse marginal.  He subsequently was reevaluated in November of     2003 with a heart catheterization which showed a 70% mid-LAD lesion, 75%     proximal circumflex lesion, a 60-70% proximal lesion in his dominant     right coronary artery.  The saphenous vein graft to his right coronary     artery was widely patent as was the  saphenous vein graft to his obtuse     marginal.  The LIMA to his LAD was atretic near its origin but felt to be     intact otherwise.  His ejection fraction at that time was 60-65%.  2. Hypertension.  3. Peripheral vascular disease with an abdominal aortic aneurysm and now     carotid artery disease.  4. Hyperlipidemia.  5. Glaucoma.  6. History of a CVA about 10 years ago with no residual sequelae other than     that described in the history of present illness.  7. Cholecystectomy and appendectomy.   SOCIAL HISTORY:  He is married.  He has two children.  He smokes actively,  about a pack a day.  He has for the last 65 years.  Has about 130-pack-year  smoking history.  He does not drink, and he does not use any drugs.  He is a  retired Engineer, building services.  He has two brothers, both of whom died in their  21s and 13s of myocardial infarctions.   MEDICATIONS:  1. Zocor 80 mg daily.  2. Lisinopril 40 mg daily.  3. Norvasc 5 mg daily.  4. Hydrochlorothiazide 25 mg daily.  5. Metoprolol 50 mg daily.  6. Isosorbide dinitrate 30 mg daily.  7. Aspirin 81 mg daily.  8. Here in the hospital he was on a NicoDerm patch and Ativan, but     otherwise, the similar medications.  Plavix has been added to his medical     profile as well.   REVIEW OF SYSTEMS:  Essentially noncontributory, except for that reviewed in  the history of present illness.   PHYSICAL EXAMINATION:  GENERAL:  He is a well-developed, elderly white male  who is in no apparent distress.  He is alert and oriented x4; however, a  detailed mini mental status exam reveals that he scores about 21 to 22 on a  scale of 30.  Specific difficulties with memory and also with complex  calculations.  VITAL SIGNS:  Blood pressure 107/69, respiratory rate 20, heart rate 73, and  he is afebrile, saturating at 93% on room air.  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  Otherwise, unremarkable.  NECK:  Supple.  He does have a  right carotid bruit but no jugular venous  distension.  LUNGS:  Decreased breath sounds throughout but are essentially clear.  HEART:  He has a nondisplaced point of maximal impulse, with a normal first  and second heart sound, a 2/6 diamond-shaped systolic murmur heard best at  the upper right sternal border which does not radiate, and there are no  diastolic murmurs.  ABDOMEN:  Soft, nontender, with normoactive bowel sounds.  No obvious  hepatosplenomegaly.  GU and rectal exam are deferred.  EXTREMITIES:  1+ distal pulses throughout.  There is no clubbing, cyanosis,  or edema.  NEUROLOGIC:  As described and is essentially nonfocal.   LABORATORY DATA:  Chest x-ray shows COPD but no acute changes.  Head CT is  as described in the history of present illness.  Electrocardiogram reveals  sinus rhythm with frequent PVCs at a rate of 76, with a normal axis, normal  interval, nonspecific ST-T wave changes, no Q waves concerning for old  myocardial infarction, and no left ventricular hypertrophy.   White blood cell count 7.7, with an H&H of 13 and 38, platelet count  122,000.  Sodium 137, potassium 4.1, chloride 105, bicarb 28, BUN 13,  creatinine 1.1, with a blood sugar of 109.  Two sets of cardiac enzymes were  inconsistent with acute myocardial infarction.  Liver function studies were  normal.  His total cholesterol was 133, triglycerides 125, HDL 43, LDL 65.   ASSESSMENT AND PLAN:  I have a gentleman with known coronary artery disease,  previously normal left ventricular function, who had an episode of  nonsustained ventricular tachycardia in the setting of what appears to be  progressive multi-infarct dementia.  He also has hypertension which is well-  controlled, ongoing tobacco abuse, and carotid artery stenosis, as well as  peripheral vascular disease, as described.  This is a gentleman who I believe needs to be evaluated (1)to access his left ventricular function to  ensure that  that is within normal limits.  We  can do that with an  echocardiogram today.  He will also need an ischemic assessment.  I think  probably most reasonably if his left ventricular function is normal would be  a dobutamine Cardiolite.  If his left ventricular function is depressed,  then he will need a heart catheterization.  If the echocardiogram shows  significant wall motion abnormalities, he will need left heart  catheterization.  He desperately needs smoking cessation.  I agree with  adding the Plavix and will follow him up from there.                                               Vida Roller, M.D.    JH/MEDQ  D:  03/03/2003  T:  03/03/2003  Job:  [id number]

## 2010-11-25 NOTE — Cardiovascular Report (Signed)
NAME:  [name], [name]                            ACCOUNT NO.:  [id number]   MEDICAL RECORD NO.:  [id number]                   PATIENT TYPE:  INP   LOCATION:  4735                                 FACILITY:  [facility]   PHYSICIAN:  Salvadore Farber, M.D.             DATE OF BIRTH:  12/23/1928   DATE OF PROCEDURE:  03/05/2003  DATE OF DISCHARGE:                              CARDIAC CATHETERIZATION   PROCEDURE:  Left heart catheterization, left ventriculography, coronary and  bypass graft angiography, left subclavian angiography.   INDICATIONS:  [name] is a 77 year old gentleman status post coronary  artery bypass grafting in 1998.  He was hospitalized at [facility]  this week for confusion.  Evaluation demonstrated multiinfarct dementia.  During that hospitalization on monitor he was noted to have nonsustained  ventricular tachycardia.  The patient denies symptoms of angina.  Because of  the nonsustained ventricular tachycardia, he is referred for diagnostic  angiography.   PROCEDURAL TECHNIQUE:  Informed consent was obtained.  Under 1% lidocaine  local anesthesia a 6-French sheath was placed in the right femoral artery  using the modified Seldinger technique.  Diagnostic angiography was  performed using JL4 and JR4 catheters for the native coronaries, JR4 for the  vein grafts, JR4 for the left subclavian, and a LIMA catheter for the left  internal mammary artery.  Ventriculography was performed by power injection  in the RAO projection using a pigtail catheter.  The patient tolerated the  procedure well and was transferred to the holding room in stable condition.  Sheaths are to be removed there.   COMPLICATIONS:  None.   FINDINGS:  1. LV 121/2/9.  EF 52% with mild inferior hypokinesis.  2. Left main:  60% ostial stenosis.  3. LAD:  Small vessel giving rise to a single large diagonal branch.  The     proximal LAD is diffusely diseased with approximately 30% stenosis.  There is a 60% stenosis just prior to the takeoff of the large diagonal     branch.  Just after the takeoff of this diagonal branch is an 80%     stenosis.  The LIMA touches down on the small distal LAD which does not     reach the apex of the heart.  The LIMA does perfuse the diagonal in a     retrograde fashion.  However, this filling may be limited by this 80%     stenosis.  However, the diagonal also receives good flow via the native     circulation.  4. Circumflex:  Moderate sized vessel giving rise to a very small first and     moderate sized obtuse marginal.  The second marginal was occluded at its     origin.  The saphenous vein graft to the marginal was widely patent     without stenosis.  5. RCA:  Large, dominant vessel.  There is  a 50% proximal stenosis and 80%     stenosis of the mid vessel.  The native PDA has 50 and 70% stenoses     proximally.  Just distal to the 70% stenosis the saphenous vein graft     touches down.  This graft is widely patent.  6. Left subclavian without stenosis.   IMPRESSION/RECOMMENDATIONS:  Widely patent bypass graft.  The ischemia on  Adenosine Cardiolite likely reflects the potentially impaired perfusion of  the diagonal branch.  While this is amenable to PCI, I do not think it is  necessary given the absence of chest pain.  Will therefore continue with his  medical therapy.  Will increase beta blockers as therapy for his  nonsustained ventricular tachycardia in the setting of preserved left  ventricular systolic function.                                               Salvadore Farber, M.D.    WED/MEDQ  D:  03/06/2003  T:  03/06/2003  Job:  [id number]   cc:   Angus G. Renard Matter, M.D.  [address]  [place]  [place] [address]  Fax: [phone]   Vida Roller, M.D.  Fax: [phone]

## 2010-11-25 NOTE — Group Therapy Note (Signed)
   NAME:  [name], [name]                              ACCOUNT NO.:  [id number]   MEDICAL RECORD NO.:  [id number]                  PATIENT TYPE:   LOCATION:                                       FACILITY:   PHYSICIAN:  Angus G. Renard Matter, M.D.              DATE OF BIRTH:   DATE OF PROCEDURE:  DATE OF DISCHARGE:                                   PROGRESS NOTE   HISTORY OF PRESENT ILLNESS:  This patient is being evaluated by Cardiologist  for chest pain which she experienced prior to admission. He is to be  transferred to [facility] today for cardiac catheterization. He was  placed on Lovenox 1 mg per kg twice a day.   PHYSICAL EXAMINATION:  VITAL SIGNS: Vital signed were within normal limits  including blood pressure, respiratory rate, and temperature.  CARDIAC: Heart regular rhythm with no murmurs.  LUNGS:  Clear to auscultation and percussion.  ABDOMEN:  No palpable masses.   ASSESSMENT:  The patient was admitted to the hospital with chest pain. Does  have history of coronary artery disease. He does have vascular disease,  generalized, 70-79% stenosis of the right internal carotid artery, no flow  on the left vertebral artery, and 4 x 3.5 cm aortic aneurysm mid to distal  aorta with 50% common femoral artery stenosis.   PLAN:  Proceed with transfer to [facility] today.                                               Angus G. Renard Matter, M.D.    AGM/MEDQ  D:  05/29/2002  T:  05/29/2002  Job:  [id number]

## 2010-11-25 NOTE — Consult Note (Signed)
NAME:  [name], [name]                  ACCOUNT NO.:  [id number]   MEDICAL RECORD NO.:  [id number]          PATIENT TYPE:  AMB   LOCATION:  DAY                           FACILITY:  [facility]   PHYSICIAN:  R. Roetta Sessions, M.D. DATE OF BIRTH:  1928-09-03   DATE OF CONSULTATION:  DATE OF DISCHARGE:                                   CONSULTATION   REASON FOR CONSULTATION:  Left lower quadrant abdominal pain for three  weeks.   HISTORY OF PRESENT ILLNESS:  [name] is a 77 year old Caucasian male who  presents with a three-week history of persistent left lower quadrant, left  flank pain.  He notes these symptoms were gradual in onset.  Pain began as  sharp and intermittent, he rates the pain 7/10 on pain scale.  Movement does  make the pain worse.  He also notes an alteration in his stools ranging  anywhere from loose and diarrheal to constipated.  He has been using over-  the-counter laxative, he does not recall the name, intermittently as well.  He also reports recently taking magnesium citrate, as he can go anywhere up  to a week without a bowel movement.  He denies any rectal bleeding or  melena.  He does report some low-grade fever and general malaise as well as  chills.  His weight and appetite have remained stable.   On October 29, 2004, he had a CT of his abdomen and pelvis without contrast.  He was found to have a small calculus near the right upper kidney without  associated obstruction.  There was also a small hyperdense rounded area in  the anterior upper pole of the left kidney measuring 1.3 cm, possibly a  hemorrhagic cyst.  Prostate gland was moderately enlarged as well.  He also  has a 4 cm infrarenal abdominal aortic aneurysm, which was unchanged.   PAST MEDICAL HISTORY:  1.  Coronary artery disease, status post CABG.  2.  Hypertension.  3.  Diabetes mellitus.  4.  Chronic constipation.  5.  Alzheimer's disease, being followed by Dr. Sandria Manly.  6.  Chronic GERD.  7.   Cholecystectomy years ago, and he does not remember details.  8.  Appendectomy.  9.  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   CURRENT MEDICATIONS:  1.  Methylprednisolone taper.  2.  Lisinopril 40 mg half-tablet daily.  3.  Oxycodone 5/325 mg one to two tablets q.4h. p.r.n.  4.  Metoprolol 50 mg b.i.d.  5.  Sertraline HCl 100 mg daily.  6.  Aricept 10 mg daily.  7.  Zocor 80 mg daily.  8.  Skelaxin 800 mg half-tablet q.i.d.  9.  Plavix 75 mg daily.  10. Aspirin 81 mg daily.  11. Hyoscyamine 0.375 mg b.i.d.  12. Hydrochlorothiazide half-tablet daily.,  13. Felodipine 5 mg daily.  14. Over-the-counter laxative p.r.n.  15. Goody's Powder rarely.   ALLERGIES:  PENICILLIN, which causes rash, CODEINE, which causes nausea and  headache.   FAMILY HISTORY:  There is no known family history of colorectal carcinoma or  liver or  chronic GI problems.  He has multiple siblings with history  significant for cancer of unknown etiology.  Both parents are deceased.   SOCIAL HISTORY:  [name] has been married for 50 years.  He has one grown  healthy son and daughter.  He is retired from [employer].  He reports a  60-year history of smoking about two packs a day.  Denies any alcohol or  drug use.   REVIEW OF SYSTEMS:  CONSTITUTIONAL:  See HPI.  Is complaining of some  exercise intolerance.  HEENT:  Complaining of some rhinorrhea.  Denies any  sore throat.  CARDIOVASCULAR:  Denies any chest pain or palpitations.  PULMONARY:  Denies any shortness of breath, dyspnea or hemoptysis.  GASTROINTESTINAL:  See HPI.  Does have occasional heartburn, which is well-  controlled with over-the-counter regimens.  Does have occasional  intermittent indigestion as well.  Denies any dysphagia or odynophagia.  Denies any regurgitation.   PHYSICAL EXAMINATION:  VITAL SIGNS:  Weight 167.5 pounds, height 69 inches,  temperature 98, blood pressure 114/60, pulse 72.  GENERAL:  Mr. Roudebush is a thin  Caucasian male who is alert and oriented and  pleasant and cooperative, in no acute distress.  HEENT:  Sclerae are clear, nonicteric.  Right pupil nonreactive.  Oropharynx  pink and moist without any lesions.  NECK:  Supple without any mass or thyromegaly.  CHEST:  Heart regular rate and rhythm with a normal S1 and S2 without any  murmurs, clicks, rubs or gallops.  Lungs clear to auscultation bilaterally.  ABDOMEN:  Flat with positive bowel sounds x4.  He does have a soft abdominal  aortic bruit to epigastrium.  There is no rebound tenderness, no  hepatosplenomegaly or mass.  EXTREMITIES:  2+ pedal pulses bilaterally, no edema.  RECTAL:  Deferred.   Laboratory studies from recent ER visit:  WBC 8, hemoglobin 9, hematocrit  27.9, MCV 70, platelets 210.  Sodium 135, potassium 4, chloride 105, CO2 29,  glucose 98, BUN 16, creatinine 1.2, total bilirubin 0.5, alkaline  phosphatase 65, AST 14, ALT 11, total protein 5.7, albumin 3.4, calcium 8.3.  Lipase 27.  Urinalysis without blood, protein or ketones and leukocytes.   ASSESSMENT:  [name] is a 77 year old Caucasian male with chronic  constipation and a three-week history of severe left lower quadrant  abdominal pain.  The pain has fairly subsided at this time.  He also has  microcytic anemia with a hemoglobin of 9, a hematocrit of 27.9 and an MCV of  70.  There is a questionable left renal lesion on CT scan without contrast  that needs to be followed up with contrast exam to further characterize and  rule out a renal mass.  He also has an enlarged prostate, and he would  benefit from a prostate-specific antigen to rule out prostate carcinoma as  well.  As far as his left lower quadrant pain is concerned, he does have  chronic constipation, which could be one etiology.  His symptoms of fever  and chills are definitely worrisome for diverticulitis, and I feel that he should undergo a CT of the abdomen and pelvis with contrast followed by   colonoscopy and esophagogastroduodenoscopy, given his history of anemia, to  rule out colorectal carcinoma as well as peptic ulcer disease.   RECOMMENDATIONS:  1.  Will obtain CT of abdomen and pelvis with IV and oral contrast as soon      as possible.  2.  If no evidence  of diverticulitis or renal mass, will proceed on with      colonoscopy and EGD by Dr. Jena Gauss in the near future.  3.  Labs today to include CBC and PSA.  4.  Further recommendations pending CT.  5.  I have discussed colonoscopy and EGD with Mr. Anthes, and he will need to      hold his aspirin and Plavix prior to the procedure.   We would like to thank Dr. Renard Matter for allowing [place] to participate in the  care of Mr. Cutshaw.      KC/MEDQ  D:  11/04/2004  T:  11/05/2004  Job:  [id number]   cc:   Angus G. Renard Matter, MD  [address]  [place]  [place] [address]  Fax: [phone]

## 2010-12-06 ENCOUNTER — Other Ambulatory Visit (INDEPENDENT_AMBULATORY_CARE_PROVIDER_SITE_OTHER): Payer: Medicare Other

## 2010-12-06 ENCOUNTER — Encounter (INDEPENDENT_AMBULATORY_CARE_PROVIDER_SITE_OTHER): Payer: Medicare Other

## 2010-12-06 DIAGNOSIS — I6529 Occlusion and stenosis of unspecified carotid artery: Secondary | ICD-10-CM

## 2010-12-06 DIAGNOSIS — I714 Abdominal aortic aneurysm, without rupture: Secondary | ICD-10-CM

## 2010-12-06 DIAGNOSIS — Z48812 Encounter for surgical aftercare following surgery on the circulatory system: Secondary | ICD-10-CM

## 2010-12-13 NOTE — Procedures (Unsigned)
CAROTID DUPLEX EXAM  INDICATION:  Follow up carotid artery disease.  HISTORY: Diabetes:  No. Cardiac:  CABG in 1998. Hypertension:  Yes. Smoking:  Yes. Previous Surgery:  Right carotid endarterectomy with a Dacron patch, 01/28/2010, by Dr. Hart Rochester. CV History:  Patient complained of pain in her right arm. Amaurosis Fugax No, Paresthesias No, Hemiparesis No.                                      RIGHT             LEFT Brachial systolic pressure:         124               110 Brachial Doppler waveforms:         WNL               WNL Vertebral direction of flow:        Antegrade         Atypical antegrade DUPLEX VELOCITIES (cm/sec) CCA peak systolic                   67                52 ECA peak systolic                   66                61 ICA peak systolic                   98                74 ICA end diastolic                   28                24 PLAQUE MORPHOLOGY:                  Heterogenous      Heterogenous PLAQUE AMOUNT:                      Mild to moderate  Moderate PLAQUE LOCATION:                    CCA, ICA          CCA, ICA, ECA  IMPRESSION:  Patent right carotid endarterectomy with no hemodynamically significant stenosis.  Left side, no hemodynamically significant stenosis.  Atypical left vertebral, decreased end-diastolic velocities suggestive of stenosis proximally.  Possible collateral flow within the left vertebral region due to occlusion.  Prominent right vertebral velocities at 0.73 m/s.  Intimal thickening within the left common carotid artery.  Study is stable.  ___________________________________________ Quita Skye. Hart Rochester, M.D.  OD/MEDQ  D:  12/07/2010  T:  12/07/2010  Job:  [id number]

## 2010-12-13 NOTE — Procedures (Unsigned)
DUPLEX ULTRASOUND OF ABDOMINAL AORTA  INDICATION:  Follow up abdominal aortic aneurysm.  HISTORY: Diabetes:  No. Cardiac:  CABG in 1998. Hypertension:  Yes. Smoking:  Yes. Connective Tissue Disorder: Family History:  No. Previous Surgery:  No.  DUPLEX EXAM:         AP (cm)                   TRANSVERSE (cm) Proximal             2.2 cm                    2.3 cm Mid                  5.0 cm                    4.6 cm Distal               4.1 cm                    3.9 cm Right Iliac          1.2 cm                    1.0 cm Left Iliac           1.0 cm                    1.1 cm  PREVIOUS:  Date:  AP:  4.6  TRANSVERSE:  4.9  IMPRESSION: 1. Stable-appearing abdominal aortic aneurysm within the mid distal     aorta with intraluminal thrombus and atherosclerosis visualized.     The largest measurements today is 5.0 X 4.6 cm. 2. The left iliac appears occluded with possible collateral flow     visualized. 3. The right iliac appears stable with moderate atherosclerosis     visualized in both iliacs.  ___________________________________________ Quita Skye Hart Rochester, M.D.  OD/MEDQ  D:  12/07/2010  T:  12/07/2010  Job:  [id number]

## 2011-02-22 ENCOUNTER — Emergency Department (HOSPITAL_COMMUNITY)
Admission: EM | Admit: 2011-02-22 | Discharge: 2011-02-22 | Disposition: A | Discharge status: Home / Self Care | Payer: Medicare Other | Attending: Emergency Medicine | Admitting: Emergency Medicine

## 2011-02-22 ENCOUNTER — Emergency Department (HOSPITAL_COMMUNITY): Payer: Medicare Other

## 2011-02-22 DIAGNOSIS — N200 Calculus of kidney: Secondary | ICD-10-CM | POA: Insufficient documentation

## 2011-02-22 LAB — COMPREHENSIVE METABOLIC PANEL
ALT: 6 U/L (ref 0–53)
Albumin: 3.6 g/dL (ref 3.5–5.2)
Alkaline Phosphatase: 90 U/L (ref 39–117)
Chloride: 106 mEq/L (ref 96–112)
Glucose, Bld: 122 mg/dL — ABNORMAL HIGH (ref 70–99)
Potassium: 3.8 mEq/L (ref 3.5–5.1)
Sodium: 139 mEq/L (ref 135–145)
Total Bilirubin: 0.2 mg/dL — ABNORMAL LOW (ref 0.3–1.2)
Total Protein: 6.7 g/dL (ref 6.0–8.3)

## 2011-02-22 LAB — URINE MICROSCOPIC-ADD ON

## 2011-02-22 LAB — CBC
Hemoglobin: 12.7 g/dL — ABNORMAL LOW (ref 13.0–17.0)
MCHC: 33.9 g/dL (ref 30.0–36.0)
RDW: 13.7 % (ref 11.5–15.5)
WBC: 9.7 10*3/uL (ref 4.0–10.5)

## 2011-02-22 LAB — AMYLASE: Amylase: 63 U/L (ref 0–105)

## 2011-02-22 LAB — URINALYSIS, ROUTINE W REFLEX MICROSCOPIC
Bilirubin Urine: NEGATIVE
Glucose, UA: NEGATIVE mg/dL
Ketones, ur: NEGATIVE mg/dL
pH: 5.5 (ref 5.0–8.0)

## 2011-02-22 LAB — DIFFERENTIAL
Eosinophils Absolute: 0.1 10*3/uL (ref 0.0–0.7)
Eosinophils Relative: 1 % (ref 0–5)
Lymphs Abs: 0.6 10*3/uL — ABNORMAL LOW (ref 0.7–4.0)
Monocytes Relative: 6 % (ref 3–12)

## 2011-02-22 MED ORDER — HYDROCODONE-ACETAMINOPHEN 5-325 MG PO TABS: 2.0000 | ORAL_TABLET | ORAL | Status: AC | PRN

## 2011-02-22 MED ORDER — IBUPROFEN 400 MG PO TABS: 400.0000 mg | ORAL_TABLET | Freq: Four times a day (QID) | ORAL | Status: AC | PRN

## 2011-02-22 MED ORDER — KETOROLAC TROMETHAMINE 30 MG/ML IJ SOLN
30.0000 mg | Freq: Once | INTRAMUSCULAR | Status: AC
  Administered 2011-02-22: 30 mg via INTRAVENOUS
  Filled 2011-02-22: qty 1

## 2011-02-22 MED ORDER — ONDANSETRON HCL 4 MG/2ML IJ SOLN
4.0000 mg | Freq: Once | INTRAMUSCULAR | Status: AC
  Administered 2011-02-22: 4 mg via INTRAVENOUS
  Filled 2011-02-22: qty 2

## 2011-02-22 MED ORDER — ONDANSETRON HCL 4 MG PO TABS: 8.0000 mg | ORAL_TABLET | Freq: Three times a day (TID) | ORAL | Status: AC | PRN

## 2011-02-22 MED FILL — Ondansetron HCl Inj 4 MG/2ML (2 MG/ML): INTRAMUSCULAR | Qty: 2 | Status: AC

## 2011-02-22 MED FILL — Hydromorphone HCl Inj 1 MG/ML: INTRAMUSCULAR | Qty: 1 | Status: AC

## 2011-02-22 NOTE — ED Notes (Signed)
See down time chart

## 2011-02-22 NOTE — ED Provider Notes (Addendum)
History    the patient presents for evaluation of left lower cautery and abdominal pain, radiating to the left flank. This pain started several hours ago at approximately 5:00 PM the day before presentation. He reports it was a gradual onset of pain in the left lower cautery and, initially associated with nausea but no vomiting. The nausea has since subsided. He is left with dull aching left lower cautery and abdominal pain radiating to the left flank that has been constant, and which he rates the pain to be approximately 6 or 7/10 in intensity. He denies dysuria, diarrhea, or vomiting. This patient encounter took place during system down time, and complete history and physical evaluation was documented on paper form. Please see the scanned image of this form for further information on this patient encounter.  CSN: [id number] Arrival date & time: 02/22/2011  1:21 AM  No chief complaint on file.  HPI  No past medical history on file.  No past surgical history on file.  No family history on file.  History  Substance Use Topics  . Smoking status: Not on file  . Smokeless tobacco: Not on file  . Alcohol Use: Not on file      Review of Systems  Physical Exam  There were no vitals taken for this visit.  Physical Exam  ED Course  Procedures  MDM Diverticulitis, renal colic, kidney stone, pancreatitis, bowel obstruction, musculoskeletal pain  Ct Abdomen Pelvis Wo Contrast  02/22/2011  *RADIOLOGY REPORT*  Clinical Data: Left-sided pain.  Previous history of kidney stones.  CT ABDOMEN AND PELVIS WITHOUT CONTRAST  Technique:  Multidetector CT imaging of the abdomen and pelvis was performed following the standard protocol without intravenous contrast.  Comparison: 12/21/2009  Findings: The lung bases are clear.  There is pyelocaliectasis and ureterectasis on the left to down to the level of the mid ureter, at the level of the upper sacrum, where there is a 5 mm stone.  The distal ureter is  decompressed and no additional ureteral stones are demonstrated.  Mild thickening the bladder wall suggesting bladder hypertrophy.  Prostate enlargement.  There is peri ureteral and pararenal soft tissue stranding on the left.  Changes are consistent with moderate obstruction.  There is another calcification in the left renal hilum which looks like a vascular calcification.  There is a hyperdense cyst in the upper pole left kidney measuring about 1.7 cm diameter probably representing hemorrhagic cyst.  There is a hypodense lesion in the lower pole left kidney measuring about 2.5 cm, likely representing a simple cysts.  These changes are seen on the previous study.  There is an intrarenal stone in the upper pole of the right kidney measuring about 5 mm diameter without obstruction.  No pyelocaliectasis or ureterectasis on the right.  The gallbladder is contracted and contains a stone.  No bile duct dilatation.  The liver and spleen are unremarkable on unenhanced imaging.  Unenhanced pancreas is normal.  There is a hypodense left adrenal gland nodule, stable since the prior study, and measuring about 2 cm diameter.  This is consistent with an adenoma.  No right adrenal gland nodules.  There is an infrarenal abdominal aortic aneurysm which measures about 5.2 x 5.4 cm diameter.  Size is stable since the prior study.  There is retroperitoneal infiltration but is attributed to the left renal stone.  No significant retroperitoneal lymphadenopathy.  Diffuse calcification of the aortic aneurysm, iliac, and external iliac arteries.  No free fluid or free air  in the abdomen.  Pelvis:  The prostate gland is enlarged.  No free or loculated pelvic fluid collections.  No inflammatory changes suggested in the colon.  The appendix is not specifically demonstrated but there are no inflammatory changes involving the right lower quadrant. Degenerative changes in the spine.  IMPRESSION:  1.  5 mm moderately obstructing stone in the left  mid ureter with proximal pyelocaliectasis, ureterectasis, periureteral stranding, and pararenal stranding. 2.  Nonobstructing stone in the right kidney.  3.  5.4 cm diameter abdominal aortic aneurysm. 4.  Contracted gallbladder with stones. 5.  Cysts in the left kidney consistent with hemorrhagic and simple cysts. 6.  Stable left adrenal gland nodule consistent with adenoma. 7.  Prostatic enlargement with bladder wall thickening suggesting bladder hypertrophy.  Original Report Authenticated By: Marlon Pel, M.D.   The patient has an apparent left sided ureteral stone causing his symptoms.  No apparent urinary tract infection.  The patient is at this time still a moderate amount of pain, improved from when he came in but not pain free or to a tolerable level of pain. He just got an injection of Toradol and I will reevaluate him to assure that he's gotten adequate analgesia  At this time the patient's pain is relieved and I will discharge him home.  Felisa Bonier, MD 02/22/11 0300  Felisa Bonier, MD 02/22/11 1610  Felisa Bonier, MD 02/22/11 [phone]

## 2011-02-22 NOTE — ED Notes (Signed)
Patient lying on stretcher with eyes closed; family remains at bedside.  Patient states he feels better and is ready to go home.

## 2011-02-23 LAB — URINE CULTURE

## 2011-03-29 LAB — BASIC METABOLIC PANEL
BUN: 10
Chloride: 109
Glucose, Bld: 89
Potassium: 4.4

## 2011-03-29 LAB — HEMOGLOBIN AND HEMATOCRIT, BLOOD: Hemoglobin: 9.9 — ABNORMAL LOW

## 2011-04-12 ENCOUNTER — Inpatient Hospital Stay (HOSPITAL_COMMUNITY)
Admission: EM | Admit: 2011-04-12 | Discharge: 2011-04-18 | DRG: 194 | Disposition: A | Discharge status: Home / Self Care | Payer: Medicare Other | Attending: Family Medicine | Admitting: Family Medicine

## 2011-04-12 ENCOUNTER — Emergency Department (HOSPITAL_COMMUNITY): Payer: Medicare Other

## 2011-04-12 ENCOUNTER — Encounter: Payer: Self-pay | Admitting: Emergency Medicine

## 2011-04-12 DIAGNOSIS — E538 Deficiency of other specified B group vitamins: Secondary | ICD-10-CM

## 2011-04-12 DIAGNOSIS — K2100 Gastro-esophageal reflux disease with esophagitis, without bleeding: Secondary | ICD-10-CM | POA: Diagnosis present

## 2011-04-12 DIAGNOSIS — IMO0002 Reserved for concepts with insufficient information to code with codable children: Secondary | ICD-10-CM

## 2011-04-12 DIAGNOSIS — I10 Essential (primary) hypertension: Secondary | ICD-10-CM | POA: Diagnosis present

## 2011-04-12 DIAGNOSIS — J449 Chronic obstructive pulmonary disease, unspecified: Secondary | ICD-10-CM | POA: Diagnosis present

## 2011-04-12 DIAGNOSIS — I714 Abdominal aortic aneurysm, without rupture, unspecified: Secondary | ICD-10-CM | POA: Diagnosis present

## 2011-04-12 DIAGNOSIS — D649 Anemia, unspecified: Secondary | ICD-10-CM | POA: Diagnosis present

## 2011-04-12 DIAGNOSIS — R64 Cachexia: Secondary | ICD-10-CM | POA: Diagnosis present

## 2011-04-12 DIAGNOSIS — R319 Hematuria, unspecified: Secondary | ICD-10-CM | POA: Diagnosis present

## 2011-04-12 DIAGNOSIS — H543 Unqualified visual loss, both eyes: Secondary | ICD-10-CM | POA: Diagnosis present

## 2011-04-12 DIAGNOSIS — F341 Dysthymic disorder: Secondary | ICD-10-CM | POA: Diagnosis present

## 2011-04-12 DIAGNOSIS — J189 Pneumonia, unspecified organism: Principal | ICD-10-CM

## 2011-04-12 DIAGNOSIS — H409 Unspecified glaucoma: Secondary | ICD-10-CM | POA: Diagnosis present

## 2011-04-12 DIAGNOSIS — J4489 Other specified chronic obstructive pulmonary disease: Secondary | ICD-10-CM | POA: Diagnosis present

## 2011-04-12 HISTORY — DX: Chronic obstructive pulmonary disease, unspecified: J44.9

## 2011-04-12 LAB — COMPREHENSIVE METABOLIC PANEL
ALT: 6 U/L (ref 0–53)
Alkaline Phosphatase: 109 U/L (ref 39–117)
CO2: 30 mEq/L (ref 19–32)
GFR calc Af Amer: >90 mL/min (ref >90)
GFR calc non Af Amer: 87 mL/min — ABNORMAL LOW (ref >90)
Glucose, Bld: 104 mg/dL — ABNORMAL HIGH (ref 70–99)
Potassium: 3.9 mEq/L (ref 3.5–5.1)
Sodium: 138 mEq/L (ref 135–145)
Total Bilirubin: 0.6 mg/dL (ref 0.3–1.2)

## 2011-04-12 LAB — DIFFERENTIAL
Eosinophils Absolute: 0.2 10*3/uL (ref 0.0–0.7)
Lymphs Abs: 1.5 10*3/uL (ref 0.7–4.0)
Monocytes Relative: 8 % (ref 3–12)
Neutro Abs: 11.1 10*3/uL — ABNORMAL HIGH (ref 1.7–7.7)
Neutrophils Relative %: 80 % — ABNORMAL HIGH (ref 43–77)

## 2011-04-12 LAB — URINALYSIS, ROUTINE W REFLEX MICROSCOPIC
Ketones, ur: NEGATIVE mg/dL
Nitrite: NEGATIVE
Urobilinogen, UA: 2 mg/dL — ABNORMAL HIGH (ref 0.0–1.0)

## 2011-04-12 LAB — CBC
Hemoglobin: 7.8 g/dL — ABNORMAL LOW (ref 13.0–17.0)
MCH: 32.6 pg (ref 26.0–34.0)
RBC: 2.39 MIL/uL — ABNORMAL LOW (ref 4.22–5.81)

## 2011-04-12 MED ORDER — BIMATOPROST 0.01 % OP SOLN
1.0000 [drp] | Freq: Every day | OPHTHALMIC | Status: DC
  Filled 2011-04-12: qty 5

## 2011-04-12 MED ORDER — BRIMONIDINE TARTRATE-TIMOLOL 0.2-0.5 % OP SOLN: 1.0000 [drp] | Freq: Two times a day (BID) | OPHTHALMIC | Status: DC

## 2011-04-12 MED ORDER — FERROUS SULFATE 325 (65 FE) MG PO TABS
325.0000 mg | ORAL_TABLET | Freq: Every day | ORAL | Status: DC
  Administered 2011-04-13 – 2011-04-18 (×6): 325 mg via ORAL
  Filled 2011-04-12 (×6): qty 1

## 2011-04-12 MED ORDER — ROSUVASTATIN CALCIUM 20 MG PO TABS
20.0000 mg | ORAL_TABLET | Freq: Every day | ORAL | Status: DC
  Administered 2011-04-12 – 2011-04-17 (×6): 20 mg via ORAL
  Filled 2011-04-12 (×6): qty 1

## 2011-04-12 MED ORDER — ACETAMINOPHEN 650 MG RE SUPP: 650.0000 mg | Freq: Four times a day (QID) | RECTAL | Status: DC | PRN

## 2011-04-12 MED ORDER — ONDANSETRON HCL 4 MG/2ML IJ SOLN
4.0000 mg | Freq: Four times a day (QID) | INTRAMUSCULAR | Status: DC | PRN
  Administered 2011-04-12: 4 mg via INTRAVENOUS
  Filled 2011-04-12: qty 2

## 2011-04-12 MED ORDER — LISINOPRIL 10 MG PO TABS
40.0000 mg | ORAL_TABLET | Freq: Every day | ORAL | Status: DC
  Administered 2011-04-13 – 2011-04-18 (×6): 40 mg via ORAL
  Filled 2011-04-12: qty 3
  Filled 2011-04-12 (×5): qty 4
  Filled 2011-04-12: qty 1

## 2011-04-12 MED ORDER — ONDANSETRON HCL 4 MG PO TABS: 4.0000 mg | ORAL_TABLET | Freq: Four times a day (QID) | ORAL | Status: DC | PRN

## 2011-04-12 MED ORDER — SODIUM CHLORIDE 0.9 % IV BOLUS (SEPSIS)
1000.0000 mL | Freq: Once | INTRAVENOUS | Status: AC
  Administered 2011-04-12: 1000 mL via INTRAVENOUS

## 2011-04-12 MED ORDER — IPRATROPIUM BROMIDE 0.02 % IN SOLN
0.5000 mg | Freq: Four times a day (QID) | RESPIRATORY_TRACT | Status: DC
  Administered 2011-04-13: 0.5 mg via RESPIRATORY_TRACT
  Filled 2011-04-12: qty 2.5

## 2011-04-12 MED ORDER — ENOXAPARIN SODIUM 40 MG/0.4ML ~~LOC~~ SOLN
40.0000 mg | SUBCUTANEOUS | Status: DC
  Administered 2011-04-13 – 2011-04-17 (×5): 40 mg via SUBCUTANEOUS
  Filled 2011-04-12 (×6): qty 0.4

## 2011-04-12 MED ORDER — SODIUM CHLORIDE 0.9 % IV SOLN
INTRAVENOUS | Status: DC
  Administered 2011-04-15 – 2011-04-16 (×2): via INTRAVENOUS
  Administered 2011-04-18: 1000 mL via INTRAVENOUS

## 2011-04-12 MED ORDER — BRIMONIDINE TARTRATE 0.15 % OP SOLN
1.0000 [drp] | Freq: Two times a day (BID) | OPHTHALMIC | Status: DC
  Administered 2011-04-13 – 2011-04-17 (×9): 1 [drp] via OPHTHALMIC
  Filled 2011-04-12 (×2): qty 5

## 2011-04-12 MED ORDER — ASPIRIN EC 81 MG PO TBEC
81.0000 mg | DELAYED_RELEASE_TABLET | Freq: Every day | ORAL | Status: DC
  Administered 2011-04-13 – 2011-04-18 (×6): 81 mg via ORAL
  Filled 2011-04-12 (×6): qty 1

## 2011-04-12 MED ORDER — METOPROLOL TARTRATE 50 MG PO TABS
50.0000 mg | ORAL_TABLET | Freq: Every day | ORAL | Status: DC
  Administered 2011-04-13 – 2011-04-18 (×6): 50 mg via ORAL
  Filled 2011-04-12 (×6): qty 1

## 2011-04-12 MED ORDER — TAMSULOSIN HCL 0.4 MG PO CAPS
0.4000 mg | ORAL_CAPSULE | Freq: Every day | ORAL | Status: DC
  Administered 2011-04-12 – 2011-04-17 (×6): 0.4 mg via ORAL
  Filled 2011-04-12 (×6): qty 1

## 2011-04-12 MED ORDER — ACETAMINOPHEN 325 MG PO TABS
650.0000 mg | ORAL_TABLET | Freq: Four times a day (QID) | ORAL | Status: DC | PRN
  Administered 2011-04-13 – 2011-04-17 (×11): 650 mg via ORAL
  Filled 2011-04-12 (×11): qty 2

## 2011-04-12 MED ORDER — POLYETHYLENE GLYCOL 3350 17 G PO PACK
17.0000 g | PACK | Freq: Every day | ORAL | Status: DC
  Administered 2011-04-13 – 2011-04-18 (×6): 17 g via ORAL
  Filled 2011-04-12 (×6): qty 1

## 2011-04-12 MED ORDER — CLOPIDOGREL BISULFATE 75 MG PO TABS
75.0000 mg | ORAL_TABLET | Freq: Every day | ORAL | Status: DC
  Administered 2011-04-13 – 2011-04-18 (×6): 75 mg via ORAL
  Filled 2011-04-12 (×6): qty 1

## 2011-04-12 MED ORDER — ALBUTEROL SULFATE (5 MG/ML) 0.5% IN NEBU
2.5000 mg | INHALATION_SOLUTION | Freq: Four times a day (QID) | RESPIRATORY_TRACT | Status: DC
  Administered 2011-04-13: 2.5 mg via RESPIRATORY_TRACT
  Filled 2011-04-12: qty 0.5

## 2011-04-12 MED ORDER — CYCLOPENTOLATE HCL 1 % OP SOLN
1.0000 [drp] | Freq: Every day | OPHTHALMIC | Status: DC
  Administered 2011-04-14 – 2011-04-17 (×4): 1 [drp] via OPHTHALMIC
  Filled 2011-04-12 (×2): qty 2

## 2011-04-12 MED ORDER — ACETAMINOPHEN 325 MG PO TABS
650.0000 mg | ORAL_TABLET | Freq: Once | ORAL | Status: AC
  Administered 2011-04-12: 650 mg via ORAL
  Filled 2011-04-12: qty 2

## 2011-04-12 MED ORDER — MOXIFLOXACIN HCL IN NACL 400 MG/250ML IV SOLN
400.0000 mg | INTRAVENOUS | Status: DC
  Filled 2011-04-12: qty 250

## 2011-04-12 MED ORDER — ALUM & MAG HYDROXIDE-SIMETH 200-200-20 MG/5ML PO SUSP: 30.0000 mL | Freq: Four times a day (QID) | ORAL | Status: DC | PRN

## 2011-04-12 MED ORDER — SODIUM CHLORIDE 0.9 % IV SOLN
INTRAVENOUS | Status: DC
  Administered 2011-04-12 – 2011-04-14 (×3): via INTRAVENOUS
  Administered 2011-04-14: 1000 mL via INTRAVENOUS
  Administered 2011-04-15: 03:00:00 via INTRAVENOUS

## 2011-04-12 MED ORDER — HYDROMORPHONE HCL 1 MG/ML IJ SOLN
0.5000 mg | INTRAMUSCULAR | Status: DC | PRN
  Administered 2011-04-12 – 2011-04-14 (×2): 0.5 mg via INTRAVENOUS
  Filled 2011-04-12 (×2): qty 1

## 2011-04-12 MED ORDER — MOXIFLOXACIN HCL IN NACL 400 MG/250ML IV SOLN
400.0000 mg | Freq: Once | INTRAVENOUS | Status: AC
  Administered 2011-04-12: 400 mg via INTRAVENOUS
  Filled 2011-04-12: qty 250

## 2011-04-12 MED ORDER — TIMOLOL MALEATE 0.5 % OP SOLN
1.0000 [drp] | Freq: Two times a day (BID) | OPHTHALMIC | Status: DC
  Administered 2011-04-13 – 2011-04-18 (×12): 1 [drp] via OPHTHALMIC
  Filled 2011-04-12 (×2): qty 5

## 2011-04-12 NOTE — ED Notes (Signed)
Pt complaining of Headache and nausea, pain and nausea medications administered.

## 2011-04-12 NOTE — ED Provider Notes (Signed)
History     CSN: [id number] Arrival date & time: 04/12/2011  2:42 PM  Chief Complaint  Patient presents with  . Weakness  . Abdominal Pain    (Consider location/radiation/quality/duration/timing/severity/associated sxs/prior treatment) HPI PT reports 2 weeks of poor appetite, limited PO intake, increasing weakness and occasional upper abdominal pain. States pain is mild, associated with vomiting but no change with eating. He reports a 40lb weight loss in the last several months. Denies any CP, SOB, fever, diarrhea, dysuria. He lives at home.  Past Medical History  Diagnosis Date  . Hypertension   . COPD (chronic obstructive pulmonary disease)     Past Surgical History  Procedure Date  . Cardiac surgery     History reviewed. No pertinent family history.  History  Substance Use Topics  . Smoking status: Current Everyday Smoker    Types: Cigarettes  . Smokeless tobacco: Not on file  . Alcohol Use: No      Review of Systems All other systems reviewed and are negative except as noted in HPI.   Allergies  Codeine and Penicillins  Home Medications  No current outpatient prescriptions on file.  BP 148/75  Pulse 115  Temp(Src) 99.2 F (37.3 C) (Oral)  Resp 20  Ht 5\' 9"  (1.753 m)  Wt 130 lb (58.968 kg)  BMI 19.20 kg/m2  SpO2 96%  Physical Exam  Nursing note and vitals reviewed. Constitutional: He is oriented to person, place, and time. He appears well-developed.       Cachectic, temporal wasting  HENT:  Head: Normocephalic and atraumatic.  Eyes: EOM are normal.       Blind from glaucoma  Neck: Normal range of motion. Neck supple.  Cardiovascular: Normal rate, normal heart sounds and intact distal pulses.   Pulmonary/Chest: Effort normal and breath sounds normal.  Abdominal: Soft. Bowel sounds are normal. He exhibits no distension. There is no tenderness. There is no rebound and no guarding.  Musculoskeletal: Normal range of motion. He exhibits no edema and no  tenderness.  Neurological: He is alert and oriented to person, place, and time. He has normal strength. No cranial nerve deficit or sensory deficit.  Skin: Skin is warm and dry. No rash noted.  Psychiatric: He has a normal mood and affect.    ED Course  Procedures (including critical care time)  Labs Reviewed  CBC - Abnormal; Notable for the following:    WBC 13.9 (*)    RBC 2.39 (*) CORRECTED FOR COLD AGGLUTININS   Hemoglobin 7.8 (*) CORRECTED FOR COLD AGGLUTININS   HCT 23.7 (*) CORRECTED FOR COLD AGGLUTININS   RDW 16.8 (*)    Platelets 426 (*)    All other components within normal limits  DIFFERENTIAL - Abnormal; Notable for the following:    Neutrophils Relative 80 (*)    Neutro Abs 11.1 (*)    Lymphocytes Relative 11 (*)    Monocytes Absolute 1.1 (*)    All other components within normal limits  COMPREHENSIVE METABOLIC PANEL - Abnormal; Notable for the following:    Glucose, Bld 104 (*)    Albumin 3.0 (*)    GFR calc non Af Amer 87 (*)    All other components within normal limits  URINALYSIS, ROUTINE W REFLEX MICROSCOPIC - Abnormal; Notable for the following:    Hgb urine dipstick LARGE (*)    Bilirubin Urine SMALL (*)    Protein, ur TRACE (*)    Urobilinogen, UA 2.0 (*)    All other components within  normal limits  URINE MICROSCOPIC-ADD ON - Abnormal; Notable for the following:    Bacteria, UA FEW (*)    All other components within normal limits  LIPASE, BLOOD   Dg Abd Acute W/chest  04/12/2011  *RADIOLOGY REPORT*  Clinical Data: Cough, weakness, abdominal pain/vomiting  ACUTE ABDOMEN SERIES (ABDOMEN 2 VIEW & CHEST 1 VIEW)  Comparison: CT abdomen pelvis dated 02/22/2011  Findings: Left lower lobe opacity, suspicious for pneumonia. No pleural effusion or pneumothorax.  Cardiomediastinal silhouette is within normal limits. Postsurgical changes related to prior CABG.  Nonobstructive bowel gas pattern.  No evidence of free air under the diaphragm on the upright view.   Calcified abdominal aortic aneurysm.  Degenerative changes of the visualized thoracolumbar spine.  IMPRESSION: Left lower lobe opacity, suspicious for pneumonia.  No evidence of small bowel obstruction or free air.  Calcified abdominal aortic aneurysm.  Original Report Authenticated By: Charline Bills, M.D.     No diagnosis found.    MDM  AAS concerning for PNA. Pt has been coughing per family. No fever, but has leukocytosis. Will give IV abx and admit for further treatment. Dr. Juanetta Gosling in the ED to see patient.         Charles B. Bernette Mayers, MD 04/12/11 [phone]

## 2011-04-12 NOTE — H&P (Signed)
JONIEL GRAUMANN MRN: [id number] DOB/AGE: [birth year] 75 y.o. Primary Care Physician:MCINNIS,ANGUS G, MD Admit date: 04/12/2011 Chief Complaint: pneumonia HPI: this is an 77 year old who has been sick for about two weeks with cough congestion ,fever chills and abdominal pain. He came to the emergency room and was diagnosed with pneumonia.  Past Medical History  Diagnosis Date  . Hypertension   . COPD (chronic obstructive pulmonary disease)   glaucoma leading to blindness Past Surgical History  Procedure Date  . Cardiac surgery        History reviewed. No pertinent family history. History of cardiac disease Social History:  reports that he has been smoking Cigarettes.  He does not have any smokeless tobacco history on file. He reports that he does not drink alcohol or use illicit drugs.   Allergies:  Allergies  Allergen Reactions  . Codeine   . Penicillins     Medications Prior to Admission  Medication Dose Route Frequency Provider Last Rate Last Dose  . 0.9 %  sodium chloride infusion   Intravenous Continuous Charles B. Bernette Mayers, MD      . 0.9 %  sodium chloride infusion   Intravenous Continuous Fredirick Maudlin      . acetaminophen (TYLENOL) tablet 650 mg  650 mg Oral Q6H PRN Fredirick Maudlin       Or  . acetaminophen (TYLENOL) suppository 650 mg  650 mg Rectal Q6H PRN Fredirick Maudlin      . acetaminophen (TYLENOL) tablet 650 mg  650 mg Oral Once Fredirick Maudlin   650 mg at 04/12/11 1842  . albuterol (PROVENTIL) (5 MG/ML) 0.5% nebulizer solution 2.5 mg  2.5 mg Nebulization Q6H Fredirick Maudlin      . alum & mag hydroxide-simeth (MAALOX/MYLANTA) 200-200-20 MG/5ML suspension 30 mL  30 mL Oral Q6H PRN Fredirick Maudlin      . aspirin EC tablet 81 mg  81 mg Oral Daily Fredirick Maudlin      . bimatoprost (LUMIGAN) 0.01 % ophthalmic solution 1 drop  1 drop Both Eyes Daily Fredirick Maudlin      . brimonidine-timolol (COMBIGAN) 0.2-0.5 % ophthalmic solution 1 drop  1 drop Both Eyes Q12H  Fredirick Maudlin      . clopidogrel (PLAVIX) tablet 75 mg  75 mg Oral Daily Fredirick Maudlin      . cyclopentolate (CYCLODRYL) 1 % ophthalmic solution 1 drop  1 drop Both Eyes Daily Fredirick Maudlin      . enoxaparin (LOVENOX) injection 40 mg  40 mg Subcutaneous Q24H Fredirick Maudlin      . ferrous sulfate tablet 325 mg  325 mg Oral Daily Fredirick Maudlin      . HYDROmorphone (DILAUDID) injection 0.5 mg  0.5 mg Intravenous Q4H PRN Fredirick Maudlin      . ipratropium (ATROVENT) nebulizer solution 0.5 mg  0.5 mg Nebulization Q6H Fredirick Maudlin      . lisinopril (PRINIVIL,ZESTRIL) tablet 40 mg  40 mg Oral Daily Fredirick Maudlin      . metoprolol (LOPRESSOR) tablet 50 mg  50 mg Oral Daily Fredirick Maudlin      . moxifloxacin (AVELOX) IVPB 400 mg  400 mg Intravenous Once Charles B. Bernette Mayers, MD 250 mL/hr at 04/12/11 1835 400 mg at 04/12/11 1835  . moxifloxacin (AVELOX) IVPB 400 mg  400 mg Intravenous Q24H Fredirick Maudlin      . ondansetron (ZOFRAN) tablet 4 mg  4 mg  Oral Q6H PRN Fredirick Maudlin       Or  . ondansetron [facility]) injection 4 mg  4 mg Intravenous Q6H PRN Fredirick Maudlin      . polyethylene glycol (MIRALAX / GLYCOLAX) packet 17 g  17 g Oral Daily Fredirick Maudlin      . rosuvastatin (CRESTOR) tablet 20 mg  20 mg Oral Daily Fredirick Maudlin      . sodium chloride 0.9 % bolus 1,000 mL  1,000 mL Intravenous Once Charles B. Bernette Mayers, MD   1,000 mL at 04/12/11 1623  . Tamsulosin HCl (FLOMAX) capsule 0.4 mg  0.4 mg Oral Daily Fredirick Maudlin       No current outpatient prescriptions on file as of 04/12/2011.       ZOX:WRUEA from the symptoms mentioned above,there are no other symptoms referable to all systems reviewed.  Physical Exam: Blood pressure 117/39, pulse 81, temperature 98.5 F (36.9 C), temperature source Oral, resp. rate 20, height 5\' 9"  (1.753 m), weight 58.968 kg (130 lb), SpO2 96.00%. He is a thin male in no acute distress. His heent exam shows that he is blind. Oral  exam shows he looks dehydrated.Neck supple .without masses.Hischest shows significant bilateral rales and rhonchi. His heart is regular without gallop. His abdomen is soft without masses and bowel sounds are present and active.Except for his blindness his central nervous system exam is normal  Results for orders placed during the hospital encounter of 04/12/11 (from the past 48 hour(s))  CBC     Status: Abnormal   Collection Time   04/12/11  4:18 PM      Component Value Range Comment   WBC 13.9 (*) 4.0 - 10.5 (K/uL)    RBC 2.39 (*) 4.22 - 5.81 (MIL/uL) CORRECTED FOR COLD AGGLUTININS   Hemoglobin 7.8 (*) 13.0 - 17.0 (g/dL) CORRECTED FOR COLD AGGLUTININS   HCT 23.7 (*) 39.0 - 52.0 (%) CORRECTED FOR COLD AGGLUTININS   MCV 99.2  78.0 - 100.0 (fL) CORRECTED FOR COLD AGGLUTININS   MCH 32.6  26.0 - 34.0 (pg) CORRECTED FOR COLD AGGLUTININS   MCHC 32.9  30.0 - 36.0 (g/dL) CORRECTED FOR COLD AGGLUTININS   RDW 16.8 (*) 11.5 - 15.5 (%)    Platelets 426 (*) 150 - 400 (K/uL)   DIFFERENTIAL     Status: Abnormal   Collection Time   04/12/11  4:18 PM      Component Value Range Comment   Neutrophils Relative 80 (*) 43 - 77 (%)    Neutro Abs 11.1 (*) 1.7 - 7.7 (K/uL)    Lymphocytes Relative 11 (*) 12 - 46 (%)    Lymphs Abs 1.5  0.7 - 4.0 (K/uL)    Monocytes Relative 8  3 - 12 (%)    Monocytes Absolute 1.1 (*) 0.1 - 1.0 (K/uL)    Eosinophils Relative 1  0 - 5 (%)    Eosinophils Absolute 0.2  0.0 - 0.7 (K/uL)    Basophils Relative 0  0 - 1 (%)    Basophils Absolute 0.0  0.0 - 0.1 (K/uL)   LIPASE, BLOOD     Status: Normal   Collection Time   04/12/11  4:18 PM      Component Value Range Comment   Lipase 25  11 - 59 (U/L)   COMPREHENSIVE METABOLIC PANEL     Status: Abnormal   Collection Time   04/12/11  4:18 PM      Component Value Range Comment  Sodium 138  135 - 145 (mEq/L)    Potassium 3.9  3.5 - 5.1 (mEq/L)    Chloride 102  96 - 112 (mEq/L)    CO2 30  19 - 32 (mEq/L)    Glucose, Bld 104 (*) 70 -  99 (mg/dL)    BUN 15  6 - 23 (mg/dL)    Creatinine, Ser 1.61  0.50 - 1.35 (mg/dL)    Calcium 8.9  8.4 - 10.5 (mg/dL)    Total Protein 6.2  6.0 - 8.3 (g/dL)    Albumin 3.0 (*) 3.5 - 5.2 (g/dL)    AST 12  0 - 37 (U/L)    ALT 6  0 - 53 (U/L)    Alkaline Phosphatase 109  39 - 117 (U/L)    Total Bilirubin 0.6  0.3 - 1.2 (mg/dL)    GFR calc non Af Amer 87 (*) >90 (mL/min)    GFR calc Af Amer >90  >90 (mL/min)   URINALYSIS, ROUTINE W REFLEX MICROSCOPIC     Status: Abnormal   Collection Time   04/12/11  4:24 PM      Component Value Range Comment   Color, Urine YELLOW  YELLOW     Appearance CLEAR  CLEAR     Specific Gravity, Urine 1.015  1.005 - 1.030     pH 6.0  5.0 - 8.0     Glucose, UA NEGATIVE  NEGATIVE (mg/dL)    Hgb urine dipstick LARGE (*) NEGATIVE     Bilirubin Urine SMALL (*) NEGATIVE     Ketones, ur NEGATIVE  NEGATIVE (mg/dL)    Protein, ur TRACE (*) NEGATIVE (mg/dL)    Urobilinogen, UA 2.0 (*) 0.0 - 1.0 (mg/dL)    Nitrite NEGATIVE  NEGATIVE     Leukocytes, UA NEGATIVE  NEGATIVE    URINE MICROSCOPIC-ADD ON     Status: Abnormal   Collection Time   04/12/11  4:24 PM      Component Value Range Comment   Squamous Epithelial / LPF RARE  RARE     WBC, UA 0-2  <3 (WBC/hpf)    RBC / HPF TOO NUMEROUS TO COUNT  <3 (RBC/hpf)    Bacteria, UA FEW (*) RARE     No results found for this or any previous visit (from the past 240 hour(s)).  Dg Abd Acute W/chest  04/12/2011  *RADIOLOGY REPORT*  Clinical Data: Cough, weakness, abdominal pain/vomiting  ACUTE ABDOMEN SERIES (ABDOMEN 2 VIEW & CHEST 1 VIEW)  Comparison: CT abdomen pelvis dated 02/22/2011  Findings: Left lower lobe opacity, suspicious for pneumonia. No pleural effusion or pneumothorax.  Cardiomediastinal silhouette is within normal limits. Postsurgical changes related to prior CABG.  Nonobstructive bowel gas pattern.  No evidence of free air under the diaphragm on the upright view.  Calcified abdominal aortic aneurysm.  Degenerative  changes of the visualized thoracolumbar spine.  IMPRESSION: Left lower lobe opacity, suspicious for pneumonia.  No evidence of small bowel obstruction or free air.  Calcified abdominal aortic aneurysm.  Original Report Authenticated By: Charline Bills, M.D.   Impression: Pneumonia Active Problems:  * No active hospital problems. *      Plan: To start avelox due to his penicillin allergy      Pantera Winterrowd L 04/12/2011, 8:01 PM

## 2011-04-12 NOTE — ED Notes (Signed)
Pt c/o headache, abd pain, weakness, and poor appetite for two weeks.

## 2011-04-12 NOTE — ED Notes (Signed)
Pt  And family states pt has been c/o headache and loss of appetite for aprox 2 weeks. Pt states he is having some abd pain and weakness today.

## 2011-04-13 LAB — RETICULOCYTES
RBC.: 1.92 MIL/uL — ABNORMAL LOW (ref 4.22–5.81)
Retic Ct Pct: 7.8 % — ABNORMAL HIGH (ref 0.4–3.1)

## 2011-04-13 LAB — CBC
HCT: 19.4 % — ABNORMAL LOW (ref 39.0–52.0)
HCT: 21.9 % — ABNORMAL LOW (ref 39.0–52.0)
MCHC: 33.5 g/dL (ref 30.0–36.0)
Platelets: 337 10*3/uL (ref 150–400)
Platelets: 319 10*3/uL (ref 150–400)
RDW: 18 % — ABNORMAL HIGH (ref 11.5–15.5)
RDW: 20.4 % — ABNORMAL HIGH (ref 11.5–15.5)
WBC: 8.9 10*3/uL (ref 4.0–10.5)

## 2011-04-13 LAB — ABO/RH: ABO/RH(D): O POS

## 2011-04-13 LAB — BASIC METABOLIC PANEL
BUN: 11 mg/dL (ref 6–23)
Calcium: 8.1 mg/dL — ABNORMAL LOW (ref 8.4–10.5)
GFR calc non Af Amer: 88 mL/min — ABNORMAL LOW (ref >90)
Glucose, Bld: 85 mg/dL (ref 70–99)

## 2011-04-13 LAB — IRON AND TIBC: TIBC: 193 ug/dL — ABNORMAL LOW (ref 215–435)

## 2011-04-13 LAB — PREPARE RBC (CROSSMATCH)

## 2011-04-13 LAB — VITAMIN B12: Vitamin B-12: 335 pg/mL (ref 211–911)

## 2011-04-13 MED ORDER — BIMATOPROST 0.01 % OP SOLN
1.0000 [drp] | Freq: Every day | OPHTHALMIC | Status: DC
  Administered 2011-04-13 – 2011-04-18 (×5): 1 [drp] via OPHTHALMIC
  Filled 2011-04-13: qty 0.1

## 2011-04-13 MED ORDER — ALBUTEROL SULFATE (5 MG/ML) 0.5% IN NEBU
2.5000 mg | INHALATION_SOLUTION | Freq: Three times a day (TID) | RESPIRATORY_TRACT | Status: DC
  Administered 2011-04-13 – 2011-04-18 (×15): 2.5 mg via RESPIRATORY_TRACT
  Filled 2011-04-13 (×17): qty 0.5

## 2011-04-13 MED ORDER — IPRATROPIUM BROMIDE 0.02 % IN SOLN
0.5000 mg | Freq: Three times a day (TID) | RESPIRATORY_TRACT | Status: DC
  Administered 2011-04-13 – 2011-04-18 (×15): 0.5 mg via RESPIRATORY_TRACT
  Filled 2011-04-13 (×17): qty 2.5

## 2011-04-13 MED ORDER — SODIUM CHLORIDE 0.9 % IJ SOLN
INTRAMUSCULAR | Status: AC
  Filled 2011-04-13: qty 10

## 2011-04-13 MED ORDER — MOXIFLOXACIN HCL IN NACL 400 MG/250ML IV SOLN
INTRAVENOUS | Status: AC
  Filled 2011-04-13: qty 250

## 2011-04-13 MED ORDER — MOXIFLOXACIN HCL IN NACL 400 MG/250ML IV SOLN
400.0000 mg | INTRAVENOUS | Status: DC
  Administered 2011-04-13 – 2011-04-16 (×4): 400 mg via INTRAVENOUS
  Filled 2011-04-13 (×4): qty 250

## 2011-04-13 NOTE — Progress Notes (Signed)
NAMEMAKELL, CYR                  ACCOUNT NO.:  [id number]  MEDICAL RECORD NO.:  [id number]  LOCATION:  A326                          FACILITY:  [facility]  PHYSICIAN:  Shoni Quijas G. Renard Matter, MD   DATE OF BIRTH:  1928-09-30  DATE OF PROCEDURE: DATE OF DISCHARGE:                                PROGRESS NOTE   This patient was admitted with cough, congestion, fever, and abdominal pain for about 2 weeks who was admitted with the diagnosis of pneumonia. He also has extremely low hemoglobin and hematocrit.  His hemoglobin is 6.5, hematocrit 19.4.  He was started on IV Avelox 400 mg daily and nebulizers every 6 hours.  OBJECTIVE:  VITAL SIGNS:  Blood pressure 147/63, respiration 20 pulse 75, temperature 98. LUNGS:  Bilateral rales and rhonchi. HEART:  Regular rhythm. ABDOMEN:  No palpable organs or masses.  ASSESSMENT:  The patient was admitted with left lower lobe pneumonia. He does have calcified abdominal aortic aneurysm.  PLAN:  To continue current IV Avelox.  We will type and cross match with 2 units of packed RBCs and give unit when available.  Also, we will obtain anemia profile.  Continue to monitor the patient's hemoglobin and hematocrit.  Continue current regimen.     Novice Vrba G. Renard Matter, MD     AGM/MEDQ  D:  04/13/2011  T:  04/13/2011  Job:  [id number]

## 2011-04-14 LAB — OCCULT BLOOD X 1 CARD TO LAB, STOOL: Fecal Occult Bld: NEGATIVE

## 2011-04-14 LAB — HEMOGLOBIN AND HEMATOCRIT, BLOOD: HCT: 24.7 % — ABNORMAL LOW (ref 39.0–52.0)

## 2011-04-14 MED ORDER — SODIUM CHLORIDE 0.9 % IJ SOLN
INTRAMUSCULAR | Status: AC
  Administered 2011-04-14: 18:00:00
  Filled 2011-04-14: qty 10

## 2011-04-14 MED ORDER — ZOLPIDEM TARTRATE 5 MG PO TABS
5.0000 mg | ORAL_TABLET | Freq: Every evening | ORAL | Status: DC | PRN
  Administered 2011-04-14 – 2011-04-17 (×4): 5 mg via ORAL
  Filled 2011-04-14 (×4): qty 1

## 2011-04-14 MED ORDER — LORAZEPAM 2 MG/ML IJ SOLN
1.0000 mg | INTRAMUSCULAR | Status: DC | PRN
  Administered 2011-04-14 – 2011-04-18 (×4): 1 mg via INTRAVENOUS
  Filled 2011-04-14 (×4): qty 1

## 2011-04-14 MED ORDER — NICOTINE 21 MG/24HR TD PT24
21.0000 mg | MEDICATED_PATCH | Freq: Every day | TRANSDERMAL | Status: DC
  Administered 2011-04-14 – 2011-04-18 (×5): 21 mg via TRANSDERMAL
  Filled 2011-04-14 (×5): qty 1

## 2011-04-14 NOTE — Progress Notes (Signed)
[name], [name]                  ACCOUNT NO.:  [id number]  MEDICAL RECORD NO.:  [id number]  LOCATION:  A326                          FACILITY:  [facility]  PHYSICIAN:  Zuriel Yeaman G. Renard Matter, MD   DATE OF BIRTH:  July 18, 1928  DATE OF PROCEDURE: DATE OF DISCHARGE:                                PROGRESS NOTE   This patient has documented pneumonia left lower lobe, is feeling some better today.  Also has extremely low hemoglobin/hematocrit and received 1 unit of packed RBCs yesterday.  He was started on IV Avelox 400 mg daily and nebulizers every 6 hours.  OBJECTIVE:  VITAL SIGNS:  Blood pressure 145/69, respirations 20, pulse 66, temperature 97.9. LUNGS:  Rhonchi and rales bilaterally. HEART:  Regular rhythm. ABDOMEN:  No palpable organs or masses. RECTAL:  Essentially normal.  LABORATORY DATA:  Most recent hemoglobin is 7.1, hematocrit 21.9 following blood transfusion.  ASSESSMENT:  The patient was admitted with left lower lobe pneumonia. He does have calcified abdominal aortic aneurysm.  He does have marked anemia of undetermined etiology.  PLAN:  Continue IV Avelox.  The patient will receive another unit of packed RBCs today when available.  Obtain Gastroenterology consult. Continue to monitor the patient's hemoglobin/hematocrit.  Continue current regimen.  The patient's serum iron was 52 and normal range. Vitamin B12 level was abnormal at 335.  Folate 2.6, ferritin within normal range at 191.     [name] G. Renard Matter, MD     AGM/MEDQ  D:  04/14/2011  T:  04/14/2011  Job:  [id number]

## 2011-04-15 LAB — URINALYSIS, ROUTINE W REFLEX MICROSCOPIC
Glucose, UA: NEGATIVE mg/dL
Ketones, ur: NEGATIVE mg/dL
Leukocytes, UA: NEGATIVE
pH: 7 (ref 5.0–8.0)

## 2011-04-15 LAB — TYPE AND SCREEN
DAT, IgG: POSITIVE
Unit division: 0
Unit division: 0

## 2011-04-15 LAB — URINE MICROSCOPIC-ADD ON

## 2011-04-15 NOTE — Progress Notes (Signed)
NAMEZAVON, HYSON                  ACCOUNT NO.:  [id number]  MEDICAL RECORD NO.:  [id number]  LOCATION:  A326                          FACILITY:  [facility]  PHYSICIAN:  Mila Homer. Sudie Bailey, M.D.DATE OF BIRTH:  1928/11/11  DATE OF PROCEDURE: DATE OF DISCHARGE:                                PROGRESS NOTE   This 77 year old patient of Dr. Butch Penny was admitted with left lower lobe pneumonia.  He was also found to be anemic on admission.  He seems to be improving, but he told me that he was terribly tired this morning because he had to get up every 1/2 hour last night to urinate.  SUBJECTIVE:  His three sisters are in the room with him today.  They gave me some more of the history and told me that his wife had died suddenly of a stroke 2 years ago and he has gone downhill since then. They also noted that he has had blindness set in over the last 4 years. He lost one eye at age 49 with blunt trauma from a piece of a stick, and the other eye apparently had glaucoma and he has gradually lost all vision, so he is essentially blind at this point.  OBJECTIVE:  VITAL SIGNS:  His temperature is 98 degrees, pulse 64, respiratory rate 18, blood pressure 161/62.  His O2 sat is 97%. LUNGS:  Show decreased breath sounds throughout, but he is moving air well.  I heard no rhonchi or wheezing at this point. HEART:  Had a regular rhythm, rate of about 70.  I reviewed his blood work.  His hemoglobin on admission was 7.8, but now he has had several transfusions and it has gone up to 9.0 as of yesterday.  His urinalysis showed too numerous to count RBCs and a few bacteria.  His anemia profile showed a total iron binding capacity of 193, but his iron level was 52, and reticulocyte count was 0.2 done manually.  He had an acute abdominal series and chest x-ray on admission.  Chest x- ray showed a left lower lobe opacity which is suspicious for pneumonia, also a calcified abdominal aortic  aneurysm.  ASSESSMENT: 1. Left lower lobe pneumonia 2. Anemia, question etiology. 3. Abdominal aortic aneurysm. 4. Hematuria. 5. Reactive depression. 6. Blindness.  I noted his IV was running at 125 mL an hour, so I cut this back to 50 mL an hour.  This should help his urinary frequency.  I am rechecking a BMP and a CBC tomorrow, and also rechecking a UA today to see if the red blood cells have cleared from the urine.  I discussed all of this with the patient and his family.     Mila Homer. Sudie Bailey, M.D.     SDK/MEDQ  D:  04/15/2011  T:  04/15/2011  Job:  [id number]

## 2011-04-16 LAB — BASIC METABOLIC PANEL
CO2: 26 mEq/L (ref 19–32)
Chloride: 109 mEq/L (ref 96–112)
Potassium: 3.7 mEq/L (ref 3.5–5.1)
Sodium: 141 mEq/L (ref 135–145)

## 2011-04-16 LAB — CBC
HCT: 26.5 % — ABNORMAL LOW (ref 39.0–52.0)
Platelets: 266 10*3/uL (ref 150–400)
RDW: 20 % — ABNORMAL HIGH (ref 11.5–15.5)
WBC: 6.6 10*3/uL (ref 4.0–10.5)

## 2011-04-16 LAB — DIFFERENTIAL
Basophils Absolute: 0 10*3/uL (ref 0.0–0.1)
Lymphocytes Relative: 13 % (ref 12–46)
Neutro Abs: 5.1 10*3/uL (ref 1.7–7.7)

## 2011-04-16 NOTE — Progress Notes (Signed)
[name], [name]                  ACCOUNT NO.:  [id number]  MEDICAL RECORD NO.:  [id number]  LOCATION:  A326                          FACILITY:  [facility]  PHYSICIAN:  Mila Homer. Sudie Bailey, M.D.DATE OF BIRTH:  10-24-1928  DATE OF PROCEDURE: DATE OF DISCHARGE:                                PROGRESS NOTE   Says that he feels better today.  Yesterday he was very, very tired in the morning after having to wake up every half hour to urinate.  I cut back his IV fluids from 125 to 50 ml/hr, and last night he slept much better, feels better.  OBJECTIVE:  He is sitting up in bed.  His daughter and son-in-law are with him in the room.  He appears in no acute distress. HEART:  Regular rhythm, rate of 70. LUNGS:  Actually are clear throughout today, moving air well without intercostal retraction or use of accessory muscles of respiration.  His hemoglobin today is 9.1, which is stable.  His white cell count was 6600.  His urine showed 3-6 WBCs and 7-10 RBCs per HPF.  This was changed from admission where he had 0-2 WBCs and too numerous to count RBCs per HPF.  ASSESSMENT: 1. Left lower lobe pneumonia, improved. 2. Anemia, stable. 3. Abdominal aortic aneurysm. 4. Hematuria, improved. 5. Reactive depression. 6. Blindness.  PLAN:  Continue current treatments.  I discussed all this with the patient and his family.  He will see Dr. Renard Matter, his LMD, tomorrow on rounds.     Mila Homer. Sudie Bailey, M.D.     SDK/MEDQ  D:  04/16/2011  T:  04/16/2011  Job:  [id number]

## 2011-04-17 DIAGNOSIS — D509 Iron deficiency anemia, unspecified: Secondary | ICD-10-CM

## 2011-04-17 LAB — DIFFERENTIAL
Eosinophils Relative: 2 % (ref 0–5)
Lymphocytes Relative: 13 % (ref 12–46)
Lymphs Abs: 0.8 10*3/uL (ref 0.7–4.0)
Monocytes Absolute: 0.5 10*3/uL (ref 0.1–1.0)

## 2011-04-17 LAB — CBC
HCT: 26.5 % — ABNORMAL LOW (ref 39.0–52.0)
MCV: 96.4 fL (ref 78.0–100.0)
RBC: 2.75 MIL/uL — ABNORMAL LOW (ref 4.22–5.81)
WBC: 6.4 10*3/uL (ref 4.0–10.5)

## 2011-04-17 LAB — RETICULOCYTES: RBC.: 2.77 MIL/uL — ABNORMAL LOW (ref 4.22–5.81)

## 2011-04-17 LAB — BASIC METABOLIC PANEL
CO2: 26 mEq/L (ref 19–32)
Calcium: 7.9 mg/dL — ABNORMAL LOW (ref 8.4–10.5)
Creatinine, Ser: 0.63 mg/dL (ref 0.50–1.35)
Glucose, Bld: 85 mg/dL (ref 70–99)
Sodium: 138 mEq/L (ref 135–145)

## 2011-04-17 MED ORDER — MOXIFLOXACIN HCL 400 MG PO TABS
400.0000 mg | ORAL_TABLET | Freq: Every day | ORAL | Status: DC
  Administered 2011-04-17: 400 mg via ORAL
  Filled 2011-04-17: qty 1

## 2011-04-17 MED ORDER — FOLIC ACID 1 MG PO TABS
1.0000 mg | ORAL_TABLET | Freq: Every day | ORAL | Status: DC
  Administered 2011-04-17 – 2011-04-18 (×2): 1 mg via ORAL
  Filled 2011-04-17 (×2): qty 1

## 2011-04-17 NOTE — Progress Notes (Signed)
[name], [name]                  ACCOUNT NO.:  [id number]  MEDICAL RECORD NO.:  [id number]  LOCATION:                                 FACILITY:  PHYSICIAN:  Dorathea Faerber G. Renard Matter, MD   DATE OF BIRTH:  [birth year]  DATE OF PROCEDURE: DATE OF DISCHARGE:                                PROGRESS NOTE   SUBJECTIVE:  This patient was admitted with left lower lobe pneumonia, anemia of undetermined etiology.  He is feeling some better and desires to go home.  OBJECTIVE:  VITAL SIGNS:  Blood pressure 183/73, respirations 20, pulse 67, temp 97.8. LUNGS:  Diminished breath sounds. HEART:  Regular rhythm. ABDOMEN:  No palpable organs or masses.  ASSESSMENT:  The patient has left lower lobe pneumonia improved, anemia of undetermined etiology, aortic aneurysm, reactive depression, blindness.  PLAN:  To continue current treatments.  We will repeat chest x-ray today and lab work CBC.  We still do not have etiology of severe anemia and he will be seen by Gastroenterology service and possible hematologist will see him as well.     Nyna Chilton G. Renard Matter, MD     AGM/MEDQ  D:  04/17/2011  T:  04/17/2011  Job:  [id number]

## 2011-04-17 NOTE — Consult Note (Addendum)
Reason for Consult: Anemia Referring Physician: Orman Matsumura is an 77 y.o. male admitted with community acquired pneumonia.  He had had congestion, cough and fever 2 weeks prior to admission. Found on admission his Hemoglobin and Hemocrit were 6.5 and 19.4.  Hemoglobin 02/22/2011 was 12.7.  Looking back on lab work, He was anemic back in 2009.  He is on Plavix 75mg  and ASA 81mg  daily.  No other NSAIDs. His daughter in law tells me his stools have been black but he is taking Iron on a daily basis. He has one stool which was guaiac negative. He has received 2 units of PRBCs. Is appetite is pretty good. No abdominal pain. No hematemesis. He has had black stools ? from the iron he has been taking.  He underwent and EGD and Colonoscopy in 2006 by Dr. Jena Gauss for microcytic anemia. See below.  11/22/2004 The patient tolerated both procedures well and was reactive after endoscopy.  IMPRESSION: Esophagogastroduodenoscopy:  1. Distal esophageal erosions consistent with erosive reflux esophagitis.  2. Non-critical Schatzki's ring not manipulated.  3. Hiatal hernia and submucosal gastric petechiae; otherwise normal gastric  mucosa.  4. Duodenitis as described above with some areas of small ulcerations;  otherwise normal D1 and D2.     Past Medical History  Diagnosis Date  . Hypertension   . COPD (chronic obstructive pulmonary disease)     Past Surgical History  Procedure Date  . Cardiac surgery   . Coronary artery bypass graft     History reviewed. No pertinent family history.  Social History:  reports that he has been smoking Cigarettes.  He has been smoking about 1 pack per day. He does not have any smokeless tobacco history on file. He reports that he does not drink alcohol or use illicit drugs.  Allergies:  Allergies  Allergen Reactions  . Codeine   . Penicillins     Medications: I have reviewed the patient's current medications.  Results for orders placed during the hospital  encounter of 04/12/11 (from the past 48 hour(s))  URINALYSIS, ROUTINE W REFLEX MICROSCOPIC     Status: Abnormal   Collection Time   04/15/11  4:05 PM      Component Value Range Comment   Color, Urine YELLOW  YELLOW     Appearance CLEAR  CLEAR     Specific Gravity, Urine 1.010  1.005 - 1.030     pH 7.0  5.0 - 8.0     Glucose, UA NEGATIVE  NEGATIVE (mg/dL)    Hgb urine dipstick SMALL (*) NEGATIVE     Bilirubin Urine NEGATIVE  NEGATIVE     Ketones, ur NEGATIVE  NEGATIVE (mg/dL)    Protein, ur NEGATIVE  NEGATIVE (mg/dL)    Urobilinogen, UA 1.0  0.0 - 1.0 (mg/dL)    Nitrite NEGATIVE  NEGATIVE     Leukocytes, UA NEGATIVE  NEGATIVE    URINE MICROSCOPIC-ADD ON     Status: Normal   Collection Time   04/15/11  4:05 PM      Component Value Range Comment   Squamous Epithelial / LPF RARE  RARE     WBC, UA 3-6  <3 (WBC/hpf)    RBC / HPF 7-10  <3 (RBC/hpf)   CBC     Status: Abnormal   Collection Time   04/16/11  6:07 AM      Component Value Range Comment   WBC 6.6  4.0 - 10.5 (K/uL)    RBC 2.76 (*) 4.22 - 5.81 (  MIL/uL)    Hemoglobin 9.1 (*) 13.0 - 17.0 (g/dL)    HCT 91.4 (*) 78.2 - 52.0 (%)    MCV 96.0  78.0 - 100.0 (fL)    MCH 33.0  26.0 - 34.0 (pg)    MCHC 34.3  30.0 - 36.0 (g/dL)    RDW 95.6 (*) 21.3 - 15.5 (%)    Platelets 266  150 - 400 (K/uL)   DIFFERENTIAL     Status: Normal   Collection Time   04/16/11  6:07 AM      Component Value Range Comment   Neutrophils Relative 77  43 - 77 (%)    Neutro Abs 5.1  1.7 - 7.7 (K/uL)    Lymphocytes Relative 13  12 - 46 (%)    Lymphs Abs 0.8  0.7 - 4.0 (K/uL)    Monocytes Relative 8  3 - 12 (%)    Monocytes Absolute 0.5  0.1 - 1.0 (K/uL)    Eosinophils Relative 3  0 - 5 (%)    Eosinophils Absolute 0.2  0.0 - 0.7 (K/uL)    Basophils Relative 0  0 - 1 (%)    Basophils Absolute 0.0  0.0 - 0.1 (K/uL)   BASIC METABOLIC PANEL     Status: Abnormal   Collection Time   04/16/11  6:07 AM      Component Value Range Comment   Sodium 141  135 - 145  (mEq/L)    Potassium 3.7  3.5 - 5.1 (mEq/L)    Chloride 109  96 - 112 (mEq/L)    CO2 26  19 - 32 (mEq/L)    Glucose, Bld 97  70 - 99 (mg/dL)    BUN 4 (*) 6 - 23 (mg/dL)    Creatinine, Ser 0.86  0.50 - 1.35 (mg/dL)    Calcium 8.1 (*) 8.4 - 10.5 (mg/dL)    GFR calc non Af Amer 88 (*) >90 (mL/min)    GFR calc Af Amer >90  >90 (mL/min)   CBC     Status: Abnormal   Collection Time   04/17/11  5:27 AM      Component Value Range Comment   WBC 6.4  4.0 - 10.5 (K/uL)    RBC 2.75 (*) 4.22 - 5.81 (MIL/uL)    Hemoglobin 9.2 (*) 13.0 - 17.0 (g/dL)    HCT 57.8 (*) 46.9 - 52.0 (%)    MCV 96.4  78.0 - 100.0 (fL)    MCH 33.5  26.0 - 34.0 (pg)    MCHC 34.7  30.0 - 36.0 (g/dL)    RDW 62.9 (*) 52.8 - 15.5 (%)    Platelets 231  150 - 400 (K/uL)   DIFFERENTIAL     Status: Abnormal   Collection Time   04/17/11  5:27 AM      Component Value Range Comment   Neutrophils Relative 78 (*) 43 - 77 (%)    Neutro Abs 5.0  1.7 - 7.7 (K/uL)    Lymphocytes Relative 13  12 - 46 (%)    Lymphs Abs 0.8  0.7 - 4.0 (K/uL)    Monocytes Relative 8  3 - 12 (%)    Monocytes Absolute 0.5  0.1 - 1.0 (K/uL)    Eosinophils Relative 2  0 - 5 (%)    Eosinophils Absolute 0.1  0.0 - 0.7 (K/uL)    Basophils Relative 0  0 - 1 (%)    Basophils Absolute 0.0  0.0 - 0.1 (K/uL)   BASIC  METABOLIC PANEL     Status: Abnormal   Collection Time   04/17/11  5:27 AM      Component Value Range Comment   Sodium 138  135 - 145 (mEq/L)    Potassium 3.5  3.5 - 5.1 (mEq/L)    Chloride 106  96 - 112 (mEq/L)    CO2 26  19 - 32 (mEq/L)    Glucose, Bld 85  70 - 99 (mg/dL)    BUN 4 (*) 6 - 23 (mg/dL)    Creatinine, Ser 9.60  0.50 - 1.35 (mg/dL)    Calcium 7.9 (*) 8.4 - 10.5 (mg/dL)    GFR calc non Af Amer >90  >90 (mL/min)    GFR calc Af Amer >90  >90 (mL/min)     No results found.  ROS Blood pressure 183/78, pulse 67, temperature 97.8 F (36.6 C), temperature source Oral, resp. rate 20, height 5\' 9"  (1.753 m), weight 142 lb 3.2 oz (64.501  kg), SpO2 95.00%.  Physical Exam Alert. Patient is blind from glaucoma. Both eyes are reddened bilaterally. Edentulous.  Thyroid is normal. No cervical lymphadenopathy. Lungs bilaterally wheezes. Heart rate slightly irregular. Abdomen soft. Bowel sounds are positive. Slight epigastric tenderness. No masses felt.  No edema to lower extremities.Iron 52, TIBC 193, B12 336. Folate low at 2.6, Ferritin normal 191.      Assessment/Plan: Mr. Swiatek 77 yr old male admitted with community acquired pneumonia and anemia.  He has one guaiac negative stool while in the hospital.  GI blood loss needs to be ruled out. I will discuss this case with Dr. Karilyn Cota.  Patient is agreeable to have an EGD/colonoscopy.  SETZER,TERRI W 04/17/2011, 8:50 AM    Patient evaluated for anemia; he has received 2 units of PRBCs; his hemoglobin is now above 9 g His stool is guaiac-negative; his iron studies revealed low serum iron is 52, serum ferritin is normal at 191 Serum B12 is normal but folate level is low.  While folate deficiency needs to be corrected I doubt that his anemia is solely on this basis. There is no evidence of overt or occult GI bleed and therefore no indication for endoscopic evaluation. If he does not respond to replacement with folic acid may need hematology consultation. To start him on folate acid 1 mg by mouth daily and check her take count in a.m. Patient's condition was discussed with Dr. Megan Mans. Lionel December, MD.

## 2011-04-18 ENCOUNTER — Inpatient Hospital Stay (HOSPITAL_COMMUNITY): Payer: Medicare Other

## 2011-04-18 LAB — DIFFERENTIAL
Eosinophils Absolute: 0.1 10*3/uL (ref 0.0–0.7)
Lymphocytes Relative: 16 % (ref 12–46)
Lymphs Abs: 0.9 10*3/uL (ref 0.7–4.0)
Monocytes Relative: 9 % (ref 3–12)
Neutro Abs: 4.2 10*3/uL (ref 1.7–7.7)
Neutrophils Relative %: 73 % (ref 43–77)

## 2011-04-18 LAB — BASIC METABOLIC PANEL
BUN: 5 mg/dL — ABNORMAL LOW (ref 6–23)
CO2: 27 mEq/L (ref 19–32)
Chloride: 109 mEq/L (ref 96–112)
Glucose, Bld: 87 mg/dL (ref 70–99)
Potassium: 3.8 mEq/L (ref 3.5–5.1)

## 2011-04-18 LAB — CBC
Hemoglobin: 9.9 g/dL — ABNORMAL LOW (ref 13.0–17.0)
MCH: 32.7 pg (ref 26.0–34.0)
RBC: 3.03 MIL/uL — ABNORMAL LOW (ref 4.22–5.81)
WBC: 5.8 10*3/uL (ref 4.0–10.5)

## 2011-04-18 MED ORDER — LISINOPRIL 40 MG PO TABS: 20.0000 mg | ORAL_TABLET | Freq: Every day | ORAL | Status: DC

## 2011-04-18 MED ORDER — FOLIC ACID 1 MG PO TABS: 1.0000 mg | ORAL_TABLET | Freq: Every day | ORAL | Status: AC

## 2011-04-18 NOTE — Discharge Summary (Signed)
Samuel, [name]                  ACCOUNT NO.:  [id number]  MEDICAL RECORD NO.:  [id number]  LOCATION:  A326                          FACILITY:  [facility]  PHYSICIAN:  Lasaro Primm G. Renard Matter, MD   DATE OF BIRTH:  12-16-1928  DATE OF ADMISSION:  04/12/2011 DATE OF DISCHARGE:  10/09/2012LH                              DISCHARGE SUMMARY   DIAGNOSES:  Left lower lobe pneumonia, calcified abdominal aortic aneurysm, hypertension, chronic obstructive pulmonary disease, glaucoma leading to blindness.  Condition is stable at the time of his discharge.  This 77 year old male, had been sick for about 2 weeks with cough and congestion, chills and fever and abdominal pain.  He came to the emergency room and was diagnosed with pneumonia, left lower lobe.  He was subsequently admitted.  He does have a past history of hypertension COPD and has blindness secondary to glaucoma.  PHYSICAL EXAMINATION:  GENERAL/VITAL SIGNS:  Alert male with blood pressure 117/39, pulse 81, temp 98.5. HEENT: Negative. NECK:  Supple. HEART:  Regular rhythm.  No murmurs. LUNGS:  Rales or rhonchi heard in lower lung field. ABDOMEN:  No palpable organs or masses. EXTREMITIES:  Free of edema.  The patient is totally blind secondary to glaucoma.  LABORATORY DATA:  On admission; WBC 13,900 with hemoglobin 7.8, hematocrit 23.7, MCV 99.2, MCH 32.6, MCHC 32.9, 80 neutrophils, 11% lymphocytes, lipase 25.  Comprehensive metabolic panel; sodium 138 potassium 3.9 chloride 102 CO2 30, glucose 104, BUN 15, creatinine 0.70, calcium 8.9, total protein 6.2, albumin 3, AST 12, ALT 6, alkaline phosphatase 109, total bilirubin 0.6.  Urinalysis is essentially negative.  CBC following transfusion; WBC 5800 with hemoglobin 9.9, hematocrit 29.3, MCV 96.7, MCH 32.7, MCHC 33.8, platelets 221. Chemistries on April 18, 2011, sodium 141 potassium 3.8, chloride 109, CO2 27, glucose 87, BUN 5, creatinine 0.71, calcium 8.1, GFR 86.  X-RAYS:  CT of the  abdomen on admission showed a left lower lobe opacity suspicious for pneumonia.  No evidence of small bowel obstruction or free air, calcified abdominal aortic aneurysm.  Chest x-ray showed left lower lobe infiltrate, effusion, probable pneumonia, COPD with hyperinflation.  Chest x-ray on October 9 showed small left pleural effusion, COPD.  HOSPITAL COURSE:  The patient at the time of his admission was placed on regular diet.  He was placed on normal saline at 125 mL an hour, Avelox 400 mg p.o. daily, albuterol nebulization t.i.d., aspirin 81 mg daily, clopidogrel, 75 mg daily, ferrous sulfate 325 mg daily, folic acid 1 mg daily, lisinopril 400 mg daily, metoprolol 50 mg daily, NicoDerm patch 21 mg daily, polyethylene glycol 17 g daily, rosuvastatin 20 mg daily, tamsulosin 0.5 mg at bedtime, Timoptic ophthalmic solution 0.5% 1 drop both eyes every 12 hours, Alphagan ophthalmic solution 0.2% 1 drop both eyes every 12 hours, Bimatoprost, 1 drop both eyes daily, ferrous sulfate 325 mg daily, folic acid 1 mg daily.  He did have extremely low hemoglobin and hematocrit.  Hemoglobin 6.5, hematocrit 19.4.  He did have a stool for occult blood done which was negative.  He was typed and crossmatched for 2 units of packed RBCs, was given each.  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  The patient tolerated transfusions in a satisfactory manner.  He was continued on IV Avelox throughout the hospitalization and did have a Gastroenterology consult regarding severe anemia, consulted Dr. Karilyn Cota.  He did have gastroduodenoscopy by Dr. Karilyn Cota which showed distal esophageal erosions consistent with erosive reflux esophagitis, noncritical Schatzki ring, manipulating hiatal hernia and submucosal gastric petechiae, otherwise normal gastric mucosa.  He was noted that he have a low folic acid level and it was recommended he be started on folic acid 1 mg daily.  Serum B12 was normal.   Folate was low.  There was no evidence of occult GI bleed, therefore no indication for endoscopic evaluation.  He began afebrile and gradually improved during his hospitalization.  A repeat chest x-ray on October 9 showed evidence of persistent pneumonia and COPD.  Due to the patient's blindness it was difficult for family to take care of him at home and considered placement in nursing facility, but they decided not to do this rather take him home for continued care. Hemoglobin at the time of his discharge was 9 g.  Serum iron was low at 52.  It was recommended that the folic acid deficiency be treated with 1 mg of folic acid daily.  It was felt also if he did not respond this might need hematology consultation.  The patient was discharged on the following medications.  1. Folic acid 1 mg daily. 2. Lisinopril 40 mg daily. 3. Aspirin 81 mg daily. 4. Clopidogrel 75 mg daily. 5. Bimatoprost 0.01% solution 1 drop in both eyes daily. 6. Plavix 75 mg daily. 7. Combigan 0.2 to 0.5 one drop in both eyes every 12 hours. 8. __________ 1 drop in both eyes daily. 9. Ferrous sulfate 325 mg daily. 10.Metoprolol 50 mg daily. 11.Simvastatin 80 mg daily. 12.Tamsulosin hydrochloride 0.4 mg daily. 13.The patient was given prescription for Avelox 400 mg to be taken 1     daily for additional week.     [name] G. Renard Matter, MD     AGM/MEDQ  D:  04/18/2011  T:  04/18/2011  Job:  [id number]

## 2011-04-18 NOTE — Progress Notes (Signed)
Results of chest x-ray called to Dr Renard Matter.

## 2011-04-18 NOTE — Progress Notes (Signed)
IV removed, site WNL.  Pt given d/c instructions and new prescriptions.  Discussed home care with patient and discussed home medications, patient verbalizes understanding. F/U appointments in place, pt states they will keep appts. Pt is A&O and stable at this time. Pt taken to main entrance in wheelchair by staff member.  

## 2011-05-01 ENCOUNTER — Other Ambulatory Visit (HOSPITAL_COMMUNITY): Payer: Self-pay | Admitting: Family Medicine

## 2011-05-01 ENCOUNTER — Ambulatory Visit (HOSPITAL_COMMUNITY)
Admission: RE | Admit: 2011-05-01 | Discharge: 2011-05-01 | Disposition: A | Discharge status: Home / Self Care | Payer: Medicare Other | Source: Ambulatory Visit | Attending: Family Medicine | Admitting: Family Medicine

## 2011-05-01 DIAGNOSIS — J180 Bronchopneumonia, unspecified organism: Secondary | ICD-10-CM | POA: Insufficient documentation

## 2011-06-09 ENCOUNTER — Encounter (INDEPENDENT_AMBULATORY_CARE_PROVIDER_SITE_OTHER): Payer: Medicare Other | Admitting: *Deleted

## 2011-06-09 ENCOUNTER — Other Ambulatory Visit (INDEPENDENT_AMBULATORY_CARE_PROVIDER_SITE_OTHER): Payer: Medicare Other | Admitting: *Deleted

## 2011-06-09 DIAGNOSIS — Z48812 Encounter for surgical aftercare following surgery on the circulatory system: Secondary | ICD-10-CM

## 2011-06-09 DIAGNOSIS — I6529 Occlusion and stenosis of unspecified carotid artery: Secondary | ICD-10-CM

## 2011-06-09 DIAGNOSIS — I714 Abdominal aortic aneurysm, without rupture: Secondary | ICD-10-CM

## 2011-06-15 ENCOUNTER — Other Ambulatory Visit (HOSPITAL_COMMUNITY): Payer: Self-pay | Admitting: Family Medicine

## 2011-06-15 ENCOUNTER — Ambulatory Visit (HOSPITAL_COMMUNITY)
Admission: RE | Admit: 2011-06-15 | Discharge: 2011-06-15 | Disposition: A | Discharge status: Home / Self Care | Payer: Medicare Other | Source: Ambulatory Visit | Attending: Family Medicine | Admitting: Family Medicine

## 2011-06-15 DIAGNOSIS — D499 Neoplasm of unspecified behavior of unspecified site: Secondary | ICD-10-CM

## 2011-06-15 DIAGNOSIS — J4489 Other specified chronic obstructive pulmonary disease: Secondary | ICD-10-CM | POA: Insufficient documentation

## 2011-06-15 DIAGNOSIS — J449 Chronic obstructive pulmonary disease, unspecified: Secondary | ICD-10-CM | POA: Insufficient documentation

## 2011-06-19 ENCOUNTER — Other Ambulatory Visit: Payer: Self-pay | Admitting: Vascular Surgery

## 2011-06-19 DIAGNOSIS — I6529 Occlusion and stenosis of unspecified carotid artery: Secondary | ICD-10-CM

## 2011-06-19 DIAGNOSIS — Z48812 Encounter for surgical aftercare following surgery on the circulatory system: Secondary | ICD-10-CM

## 2011-06-19 DIAGNOSIS — I714 Abdominal aortic aneurysm, without rupture: Secondary | ICD-10-CM

## 2011-06-21 ENCOUNTER — Encounter: Payer: Self-pay | Admitting: Vascular Surgery

## 2011-06-21 NOTE — Procedures (Unsigned)
CAROTID DUPLEX EXAM  INDICATION:  Carotid endarterectomy.  HISTORY: Diabetes:  No. Cardiac:  CABG. Hypertension:  Yes. Smoking:  Yes. Previous Surgery:  Right carotid endarterectomy on 01/18/2010. CV History:  Currently asymptomatic. Amaurosis Fugax No, Paresthesias No, Hemiparesis No.                                      RIGHT             LEFT Brachial systolic pressure: Brachial Doppler waveforms: Vertebral direction of flow:        Antegrade         Not visualized DUPLEX VELOCITIES (cm/sec) CCA peak systolic                   53                52 ECA peak systolic                   80                65 ICA peak systolic                   84                66 ICA end diastolic                   23                16 PLAQUE MORPHOLOGY:                  Heterogenous      Mixed PLAQUE AMOUNT:                      Mild              Mild PLAQUE LOCATION:                    CCA               ICA/ECA/CCA  IMPRESSION: 1. Patent right carotid endarterectomy site with no right internal     carotid artery stenosis. 2. No hemodynamically significant stenosis of the left internal     carotid artery. 3. No significant change noted when compared to the previous     examination on 12/06/2010.  ___________________________________________ Quita Skye. Hart [place], M.D.  CH/MEDQ  D:  06/13/2011  T:  06/13/2011  Job:  [id number]

## 2011-06-21 NOTE — Procedures (Unsigned)
DUPLEX ULTRASOUND OF ABDOMINAL AORTA  INDICATION:  Abdominal aortic aneurysm.  HISTORY: Diabetes:  No. Cardiac:  CABG. Hypertension:  Yes. Smoking:  Yes. Connective Tissue Disorder: Family History:  No. Previous Surgery:  Carotid surgery in 2011.  DUPLEX EXAM:         AP (cm)                   TRANSVERSE (cm) Proximal             2.8 cm                    2.8 cm Mid                  2.4 cm                    2.4 cm Distal               5.3 cm                    5.4 cm Right Iliac          Not visualized            Not visualized Left Iliac           Not visualized            Not visualized  PREVIOUS:  Date: 12/06/2010  AP:  5.0  TRANSVERSE:  4.6  IMPRESSION: 1. Aneurysmal dilatation of the distal abdominal aorta with no     significant increase in maximum diameter when compared to the     previous examination. 2. Unable to adequately visualize the bilateral common iliac arteries     due to overlying bowel gas patterns.  ___________________________________________ Quita Skye Hart Rochester, M.D.  CH/MEDQ  D:  06/13/2011  T:  06/13/2011  Job:  [id number]

## 2011-06-22 ENCOUNTER — Other Ambulatory Visit: Payer: Self-pay | Admitting: Vascular Surgery

## 2011-06-22 DIAGNOSIS — I6529 Occlusion and stenosis of unspecified carotid artery: Secondary | ICD-10-CM

## 2011-06-22 DIAGNOSIS — Z48812 Encounter for surgical aftercare following surgery on the circulatory system: Secondary | ICD-10-CM

## 2011-06-22 DIAGNOSIS — I714 Abdominal aortic aneurysm, without rupture: Secondary | ICD-10-CM

## 2011-07-30 ENCOUNTER — Emergency Department (HOSPITAL_COMMUNITY): Payer: Medicare Other

## 2011-07-30 ENCOUNTER — Emergency Department (HOSPITAL_COMMUNITY)
Admission: EM | Admit: 2011-07-30 | Discharge: 2011-07-30 | Disposition: A | Discharge status: Home / Self Care | Payer: Medicare Other | Attending: Emergency Medicine | Admitting: Emergency Medicine

## 2011-07-30 ENCOUNTER — Encounter (HOSPITAL_COMMUNITY): Payer: Self-pay | Admitting: *Deleted

## 2011-07-30 DIAGNOSIS — Z951 Presence of aortocoronary bypass graft: Secondary | ICD-10-CM | POA: Insufficient documentation

## 2011-07-30 DIAGNOSIS — Z9889 Other specified postprocedural states: Secondary | ICD-10-CM | POA: Insufficient documentation

## 2011-07-30 DIAGNOSIS — R350 Frequency of micturition: Secondary | ICD-10-CM | POA: Insufficient documentation

## 2011-07-30 DIAGNOSIS — H5712 Ocular pain, left eye: Secondary | ICD-10-CM

## 2011-07-30 DIAGNOSIS — F17200 Nicotine dependence, unspecified, uncomplicated: Secondary | ICD-10-CM | POA: Insufficient documentation

## 2011-07-30 DIAGNOSIS — R319 Hematuria, unspecified: Secondary | ICD-10-CM | POA: Insufficient documentation

## 2011-07-30 DIAGNOSIS — H543 Unqualified visual loss, both eyes: Secondary | ICD-10-CM | POA: Insufficient documentation

## 2011-07-30 DIAGNOSIS — J4489 Other specified chronic obstructive pulmonary disease: Secondary | ICD-10-CM | POA: Insufficient documentation

## 2011-07-30 DIAGNOSIS — H5710 Ocular pain, unspecified eye: Secondary | ICD-10-CM | POA: Insufficient documentation

## 2011-07-30 DIAGNOSIS — Z79899 Other long term (current) drug therapy: Secondary | ICD-10-CM | POA: Insufficient documentation

## 2011-07-30 DIAGNOSIS — J449 Chronic obstructive pulmonary disease, unspecified: Secondary | ICD-10-CM | POA: Insufficient documentation

## 2011-07-30 DIAGNOSIS — I10 Essential (primary) hypertension: Secondary | ICD-10-CM | POA: Insufficient documentation

## 2011-07-30 DIAGNOSIS — Z87442 Personal history of urinary calculi: Secondary | ICD-10-CM | POA: Insufficient documentation

## 2011-07-30 LAB — URINALYSIS, ROUTINE W REFLEX MICROSCOPIC
Bilirubin Urine: NEGATIVE
Glucose, UA: NEGATIVE mg/dL
Ketones, ur: NEGATIVE mg/dL
Leukocytes, UA: NEGATIVE
Nitrite: NEGATIVE
Protein, ur: NEGATIVE mg/dL
Specific Gravity, Urine: 1.01 (ref 1.005–1.030)
Urobilinogen, UA: 0.2 mg/dL (ref 0.0–1.0)
pH: 7.5 (ref 5.0–8.0)

## 2011-07-30 LAB — CBC
HCT: 32.4 % — ABNORMAL LOW (ref 39.0–52.0)
Hemoglobin: 10.8 g/dL — ABNORMAL LOW (ref 13.0–17.0)
MCH: 32.1 pg (ref 26.0–34.0)
MCHC: 33.3 g/dL (ref 30.0–36.0)

## 2011-07-30 LAB — URINE MICROSCOPIC-ADD ON

## 2011-07-30 LAB — BASIC METABOLIC PANEL
BUN: 12 mg/dL (ref 6–23)
Calcium: 9.3 mg/dL (ref 8.4–10.5)
Creatinine, Ser: 0.7 mg/dL (ref 0.50–1.35)
GFR calc non Af Amer: 87 mL/min — ABNORMAL LOW (ref >90)
Glucose, Bld: 106 mg/dL — ABNORMAL HIGH (ref 70–99)

## 2011-07-30 LAB — URINE CULTURE

## 2011-07-30 MED ORDER — ONDANSETRON HCL 4 MG/2ML IJ SOLN
4.0000 mg | Freq: Once | INTRAMUSCULAR | Status: AC
  Administered 2011-07-30: 4 mg via INTRAVENOUS
  Filled 2011-07-30: qty 2

## 2011-07-30 MED ORDER — IOHEXOL 300 MG/ML  SOLN
100.0000 mL | Freq: Once | INTRAMUSCULAR | Status: AC | PRN
  Administered 2011-07-30: 100 mL via INTRAVENOUS

## 2011-07-30 MED ORDER — MORPHINE SULFATE 4 MG/ML IJ SOLN
4.0000 mg | Freq: Once | INTRAMUSCULAR | Status: AC
  Administered 2011-07-30: 4 mg via INTRAVENOUS
  Filled 2011-07-30: qty 1

## 2011-07-30 NOTE — ED Notes (Addendum)
Pt c/o being unable to urinate. Pt also c/o slight headache.

## 2011-07-30 NOTE — ED Provider Notes (Signed)
History     CSN: [id number]  Arrival date & time 07/30/11  5621   First MD Initiated Contact with Patient 07/30/11 [phone]      Chief Complaint  Patient presents with  . Urinary Retention  . Hematuria    (Consider location/radiation/quality/duration/timing/severity/associated sxs/prior treatment) HPI A LEVEL 5 CAVEAT PERTAINS DUE TO POOR HISTORIAN Patient with history of multiple medical problems presents with complaint of frequent urination. Upon my evaluating and he was noted to have blood-tinged urine. Patient is blind and was not aware of blood in his urine. Upon further questioning he denies any abdominal or flank pain. He does have a history of renal stones. He's had no difficulty urinating. He also was noted to have swelling and bruising of his left eye. He states that he had an enucleation performed at [facility] last week because his eye "ruptured". Patient is not able to contribute much further to the history in terms of his current treatment plan postoperatively for his eye. Further history was obtained from records of recent hospitalization at [facility].  He denies any fever/chills, no vomiting.  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  He was treated with abx eye ointment and avelox.  He has a follow up appointment with opthalmology scheduled for this week.  Pt also had frequent urination in the hospital and was discharged with condom catheter to f/u with Urology.  Pt arrives with wet pants and denies any previous history of urinary problems.   Past Medical History  Diagnosis Date  . Hypertension   . COPD (chronic obstructive pulmonary disease)     Past Surgical History  Procedure Date  . Cardiac surgery   . Coronary artery bypass graft   . Eye surgery     History reviewed. No pertinent family history.  History  Substance Use Topics  . Smoking status: Current  Everyday Smoker -- 1.0 packs/day    Types: Cigarettes  . Smokeless tobacco: Not on file  . Alcohol Use: No      Review of Systems UNABLE TO OBTAIN ROS DUE TO POOR HISTORIAN  Allergies  Codeine and Penicillins  Home Medications   Current Outpatient Rx  Name Route Sig Dispense Refill  . ASPIRIN EC 81 MG PO TBEC Oral Take 81 mg by mouth daily.      Marland Kitchen BIMATOPROST 0.01 % OP SOLN Both Eyes Place 1 drop into both eyes daily.      Marland Kitchen BRIMONIDINE TARTRATE-TIMOLOL 0.2-0.5 % OP SOLN Both Eyes Place 1 drop into both eyes every 12 (twelve) hours.      . CLOPIDOGREL BISULFATE 75 MG PO TABS Oral Take 75 mg by mouth daily.      . CYCLOPENTOLATE HCL 1 % OP SOLN Both Eyes Place 1 drop into both eyes daily.      Marland Kitchen DOCUSATE SODIUM 100 MG PO CAPS Oral Take 100 mg by mouth 2 (two) times daily.    . FENTANYL 12 MCG/HR TD PT72 Transdermal Place 1 patch onto the skin every 3 (three) days.    Di Kindle SULFATE 325 (65 FE) MG PO TABS Oral Take 325 mg by mouth daily.      Marland Kitchen FOLIC ACID 1 MG PO TABS Oral Take 1 tablet (1 mg total) by mouth daily. 30 tablet 5  . LISINOPRIL 40 MG PO TABS Oral Take 0.5 tablets (20 mg total) by mouth daily. 30 tablet 5  .  LORAZEPAM 1 MG PO TABS Oral Take 1 mg by mouth every 8 (eight) hours.    Marland Kitchen METOPROLOL TARTRATE 50 MG PO TABS Oral Take 50 mg by mouth daily.      Marland Kitchen MIRTAZAPINE 7.5 MG PO TABS Oral Take 7.5 mg by mouth at bedtime.    Marland Kitchen ONDANSETRON 8 MG PO TBDP Oral Take 8 mg by mouth every 8 (eight) hours as needed.    Marland Kitchen POLYETHYLENE GLYCOL 3350 PO PACK Oral Take 17 g by mouth daily.    Marland Kitchen PROMETHAZINE HCL 25 MG PO TABS Oral Take 25 mg by mouth every 6 (six) hours as needed.    . SENNA 8.6 MG PO TABS Oral Take 1 tablet by mouth.    Marland Kitchen SIMVASTATIN 80 MG PO TABS Oral Take 80 mg by mouth daily. For cholesterol     . TAMSULOSIN HCL 0.4 MG PO CAPS Oral Take 0.4 mg by mouth daily.        BP 160/59  Pulse 76  Temp 97.9 F (36.6 C)  Resp 18  Wt 135 lb (61.236 kg)  SpO2 97% Vitals  reviewed Physical Exam Physical Examination: General appearance - alert, well appearing, and in no distress Mental status - alert, oriented to person, place, and time Eyes - left eye with swelling/bruising overlying eyelid, lids sewn closed, some drainage from eye, right eye with 1mm pupil, some conjunctival injection, pt blind from both eyes Mouth - mucous membranes moist, pharynx normal without lesions Chest - clear to auscultation, no wheezes, rales or rhonchi, symmetric air entry Heart - normal rate, regular rhythm, normal S1, S2, no murmurs, rubs, clicks or gallops Abdomen - soft, nontender, nondistended, no masses or organomegaly Back exam - full range of motion, no tenderness, palpable spasm or pain on motion Extremities - peripheral pulses normal, no pedal edema, no clubbing or cyanosis Skin - normal coloration and turgor, no rashes except around eye as described.   ED Course  Procedures (including critical care time) 6:15 AM- records for recent hospitalization have been requested from [facility]  8:18 AM d/w with family who are now at bedside.  They state his eye is less swollen and less drainage than it has been over the past few days.  Also, they confirm that he saw Dr. Renard Matter last week in follow up and that he is at his baseline overall.  Pt is requesting to be discharged.  I have discussed his case with Dr. Fransico Him, on call for Dr. Renard Matter who recommends I speak with Dr. Renard Matter who is at the hospital today rounding.  Secretary has called floor multiple times to reach Dr. Renard Matter.    8:38 AM d/w Dr. Renard Matter- he is very familiar with this patient and he agrees with urine culture, will see patient tomorrow in his clinic for follow up.    Labs Reviewed  URINALYSIS, ROUTINE W REFLEX MICROSCOPIC - Abnormal; Notable for the following:    Hgb urine dipstick LARGE (*)    All other components within normal limits  CBC - Abnormal; Notable for the following:    RBC 3.36 (*)    Hemoglobin  10.8 (*)    HCT 32.4 (*)    Platelets 113 (*)    All other components within normal limits  BASIC METABOLIC PANEL - Abnormal; Notable for the following:    Glucose, Bld 106 (*)    GFR calc non Af Amer 87 (*)    All other components within normal limits  URINE MICROSCOPIC-ADD ON  URINE CULTURE   Ct Abdomen Pelvis Wo Contrast  07/30/2011  *RADIOLOGY REPORT*  Clinical Data: Hematuria.  Unable to urinate.  CT ABDOMEN AND PELVIS WITHOUT CONTRAST  Technique:  Multidetector CT imaging of the abdomen and pelvis was performed following the standard protocol without intravenous contrast.  Comparison: 02/22/2011  Findings: Lung bases are clear.  No effusions.  Heart is normal size.  There is biliary ductal dilatation.  This is stable when compared to prior study.  Prior cholecystectomy.  Spleen, pancreas, right adrenal have an unremarkable unenhanced appearance. Nodule within the left adrenal gland again noted, this is stable.  Hyperdense lesion noted in the upper pole of the left kidney measures 2 cm, stable, likely hemorrhagic cyst.  Nonobstructing stone in the upper pole of the right kidney.  Stable cyst in the lower pole of the left kidney.  Small nonobstructing stone in the mid pole of the left kidney.  No hydronephrosis.  No ureteral stones.  5.6 cm abdominal aortic aneurysm, stable since prior study (5.5 cm previously).  Aorta iliac calcifications.  There is prostate enlargement.  Bowel grossly unremarkable.  No free fluid, free air or adenopathy.  IMPRESSION:  No ureteral stones or hydronephrosis.  Bilateral nephrolithiasis.  Stable low density area in the lower pole of the left kidney, likely cyst.  Stable hemorrhagic cyst in the upper pole of the left kidney.  Stable biliary ductal dilatation.  Stable abdominal aortic aneurysm, 5.6 cm maximally.  Original Report Authenticated By: Cyndie Chime, M.D.   Ct Orbits W/cm  07/30/2011  *RADIOLOGY REPORT*  Clinical Data: Recent ocular removal.  Redness, pain,  oozing from left orbit.  CT ORBITS WITH CONTRAST  Technique:  Multidetector CT imaging of the orbits was performed following the bolus administration of intravenous contrast.  Contrast: OMNIPAQUE IOHEXOL 300 MG/ML IV SOLN  Comparison: None.  Findings: There is an ocular implant within the left orbit. Overlying soft tissue swelling.  No focal fluid collection noted. Right orbital soft tissues unremarkable.  No bony abnormality.  The mucosal thickening within the ethmoid air cells.  IMPRESSION: Left ocular implant.  Overlying soft tissue swelling.  No focal fluid collection.  Original Report Authenticated By: Cyndie Chime, M.D.     1. Hematuria   2. Left eye pain       MDM  Pt presenting with frequency of urination and hematuria. Her the results and records received from [facility] this has been a chronic issue. He has a history of renal stones but did not have any pain and there is no renal stone on CT scan. His left eye is postsurgical after enucleation procedure at [facility]. There is some drainage however once family members arrived at bedside they confirmed that his eye is less swollen less bruised and less erythematous than it had been prior. CT of his orbits did not reveal any abscess. I discussed his case with Dr. Renard Matter who is his primary physician and he will see him in followup tomorrow in the office. It sounds as if this patient is at his baseline according to family and Dr. Renard Matter. He was discharged with strict return precautions and he and his family are agreeable with the plan.  A urine culture was sent and no antibiotics will be given at this time- Dr. Renard Matter will follow up with culture results and agrees with no abx at this time.          Ethelda Chick, MD 07/31/11 [phone]

## 2011-07-30 NOTE — ED Notes (Signed)
Patient is alert and oriented x 4 with respirations even and unlabored.  NAD at this time.  Discharge instructions reviewed with patient and patient verbalized understanding.  Pt transported to lobby via wheelchair, and son to transport pt home.  

## 2011-08-02 ENCOUNTER — Other Ambulatory Visit (HOSPITAL_COMMUNITY): Payer: Self-pay | Admitting: Urology

## 2011-08-02 DIAGNOSIS — N400 Enlarged prostate without lower urinary tract symptoms: Secondary | ICD-10-CM

## 2011-08-02 DIAGNOSIS — R310 Gross hematuria: Secondary | ICD-10-CM

## 2011-08-07 ENCOUNTER — Ambulatory Visit (HOSPITAL_COMMUNITY)
Admission: RE | Admit: 2011-08-07 | Discharge: 2011-08-07 | Disposition: A | Discharge status: Home / Self Care | Payer: Medicare Other | Source: Ambulatory Visit | Attending: Urology | Admitting: Urology

## 2011-08-07 DIAGNOSIS — I714 Abdominal aortic aneurysm, without rupture, unspecified: Secondary | ICD-10-CM | POA: Insufficient documentation

## 2011-08-07 DIAGNOSIS — N400 Enlarged prostate without lower urinary tract symptoms: Secondary | ICD-10-CM | POA: Insufficient documentation

## 2011-08-07 DIAGNOSIS — R9389 Abnormal findings on diagnostic imaging of other specified body structures: Secondary | ICD-10-CM | POA: Insufficient documentation

## 2011-08-07 DIAGNOSIS — N200 Calculus of kidney: Secondary | ICD-10-CM | POA: Insufficient documentation

## 2011-08-07 DIAGNOSIS — R310 Gross hematuria: Secondary | ICD-10-CM | POA: Insufficient documentation

## 2011-08-07 MED ORDER — IOHEXOL 300 MG/ML  SOLN
125.0000 mL | Freq: Once | INTRAMUSCULAR | Status: AC | PRN
  Administered 2011-08-07: 125 mL via INTRAVENOUS

## 2011-08-16 ENCOUNTER — Encounter (HOSPITAL_COMMUNITY): Payer: Self-pay | Admitting: Pharmacy Technician

## 2011-08-17 NOTE — Patient Instructions (Addendum)
22 [name]  08/17/2011   Your procedure is scheduled on:   08/22/2011  Report to [facility] at  930  AM.  Call this number if you have problems the morning of surgery: [phone]   Remember:   Do not eat food:After Midnight.  May have clear liquids:until Midnight .  Clear liquids include soda, tea, black coffee, apple or grape juice, broth.  Take these medicines the morning of surgery with A SIP OF WATER: lisinopril,metoprolol,flomax,zofran,ativan,phenergan   Do not wear jewelry, make-up or nail polish.  Do not wear lotions, powders, or perfumes. You may wear deodorant.  Do not shave 48 hours prior to surgery.  Do not bring valuables to the hospital.  Contacts, dentures or bridgework may not be worn into surgery.  Leave suitcase in the car. After surgery it may be brought to your room.  For patients admitted to the hospital, checkout time is 11:00 AM the day of discharge.   Patients discharged the day of surgery will not be allowed to drive home.  Name and phone number of your driver: family  Special Instructions: CHG Shower Use Special Wash: 1/2 bottle night before surgery and 1/2 bottle morning of surgery.   Please read over the following fact sheets that you were given: Pain Booklet, MRSA Information, Surgical Site Infection Prevention, Anesthesia Post-op Instructions and Care and Recovery After Surgery Transurethral Resection of the Prostate Transurethral Resection of the Prostate (TURP) is often treatment for non-cancerous (benign) prostatic hyperplasia (BPH) or prostate cancer. BPH commonly begins in middle aged men and can cause symptoms at any age thereafter. The complications that stem from these problems, such as recurrent infection and bladder control and emptying problems, are often helped by this procedure. Both conditions usually cause the prostate to increase in size. TURP is a major surgery that removes part of the prostate gland. The goal is to remove enough prostate to  allow for unobstructed flow of urine. TREATMENT This surgery (TURP procedure) is done using an instrument like a narrow telescope to look into your bladder. This is then used to remove enlarged pieces of your prostate, one piece at a time. This removes the blockage and makes it easier for you to urinate. This is called a transurethral resection of the prostate. A lesser procedure is sometimes done. In this procedure, small cuts are made in the prostate. This lessens the prostates pressure on the urethra. This is called a transurethral incision (cut by the surgeon) of the prostate (TUIP). You will probably be comfortable soon after the operation, but it will take 10 to 12 weeks, or longer, for your prostate to heal after you leave the hospital.  LET YOUR CAREGIVER KNOW ABOUT:  Allergies.   Medications taken including herbs, eye drops, over the counter medications, and creams.   Use of steroids (by mouth or creams).   Previous problems with anesthetics or Novocaine.   History of blood clots (thrombophlebitis).   History of bleeding or blood problems.   Previous surgery.   Previous prostate infections.   Other health problems.  RISKS AND COMPLICATIONS  Rare injury to the bowel (intestine).   Rare injury to the bladder or adjacent blood vessels.   Intestinal/bowel obstruction.   Scarring called "stricture" that cause later problems with the flow of urine.   Bleeding and the need for blood transfusion (more common in those with prostate cancer and those who have previously received radiation therapy).   Inability to control your urine (incontinence). This is  more common in those with prostate cancer and those who have received radiation therapy.   Injury to one of the ureters (tubes that drain the kidneys into the bladder) and/or urethra (the tube that drains the bladder).   Injury to the capsule that holds the prostate. This can lead to leakage of fluid and urine into the belly  (abdomen).   The operation can possibly lead to impotence. This is the inability to get an erection. Treatments are available for these types of problems.   Rare over absorption of the fluids used during the operation. This can then cause low blood levels of sodium, brain swelling, strain on the heart, and fluid accumulation in the lungs. This is more common in long procedures.   Infection.   Blood clots in the legs.   As with any major surgery, there is always the rare chance of a complicating stroke, heart attack, or other complications that will be discussed with you by the surgeon and anesthesiologist.  BEFORE THE PROCEDURE   You may be asked to temporarily adjust your diet. If so, your caregiver will give you specific recommendations.   If you are on blood thinners, stop taking them before the operation, or as your caregiver advises.   You should have nothing to eat or drink after midnight prior to your surgery or as suggested by your caregiver. You may have a sip of water to take medications not stopped for the procedure  PROCEDURE  This operation is performed after you have been given a medication to help you sleep (anesthetic), or with a spinal block. The spinal block keeps you awake but numb from the waist down.  No cut or incision is needed. During the operation, your surgeon passes a viewing and cutting instrument (resectoscope) through the penis into the prostate gland. The instrument contains an electric cutting edge. From inside the prostate, this cutter is used to remove part of the prostate.  HOME CARE INSTRUCTIONS  For your own protection, observe the following precautions for 10 days after your operation.  You may go home with a catheter. Take care of it as directed. You will receive instruction on catheter care.   After catheter removal, empty the bladder whenever you feel a definite desire. Do not try to hold the urine for long periods of time.   For 10 days, avoid all  lifting, straining, running, strenuous work, walks longer than a couple blocks, riding in a car for extended periods, and sexual relations.   Take 2 tablespoons of heavy mineral oil or Metamucil night and morning for 3 or 4 days. After that, gradually reduce the dose to one or two teaspoons twice daily. Stop it after the stools have been normal for a week. If you become constipated, do not strain to move your bowels. You may use an enema. Notify your caregiver about problems.   Even after complete healing, you may continue to urinate once or twice during the night.   In addition to your usual medications, you may be given an antibiotic to take for 10-14 days. Notify your caregiver if you have any side effects or problems with the medication.   Avoid alcohol and caffeinated drinks for 2 weeks, as they are irritating to the bladder. Decaffeinated drinks are fine.   Eat a regular diet, avoiding spicy foods for 2 weeks.   You may continue non-strenuous activities. It is always important to keep active after an operation. This lessens the chance of developing blood clots.  You may see some recurrence of blood in the urine after discharge from the hospital. Even a small amount of blood colors the urine very red. If this occurs, force fluids again as you did in the hospital. This is generally not a concern.  SEEK MEDICAL CARE IF:   You have chills or night sweats.   You are leaking around your catheter or have problems with your catheter.   You develop side effects that you think are coming from your medications.  SEEK IMMEDIATE MEDICAL CARE IF:   You are suddenly unable to urinate. This is an emergency.   You develop shortness of breath or chest pains.   Bleeding persists or clots develop.   You have a fever.   You develop pain in your back or over your lower belly (abdomen).   You develop pain or swelling in your legs.   You develop swelling in your abdomen or have a sudden weight  gain.   The problems get worse rather than better.  Document Released: 06/26/2005 Document Revised: 03/08/2011 Document Reviewed: 01/12/2009 [facility] Patient Information 2012 [place], [place].PATIENT INSTRUCTIONS POST-ANESTHESIA  IMMEDIATELY FOLLOWING SURGERY:  Do not drive or operate machinery for the first twenty four hours after surgery.  Do not make any important decisions for twenty four hours after surgery or while taking narcotic pain medications or sedatives.  If you develop intractable nausea and vomiting or a severe headache please notify your doctor immediately.  FOLLOW-UP:  Please make an appointment with your surgeon as instructed. You do not need to follow up with anesthesia unless specifically instructed to do so.  WOUND CARE INSTRUCTIONS (if applicable):  Keep a dry clean dressing on the anesthesia/puncture wound site if there is drainage.  Once the wound has quit draining you may leave it open to air.  Generally you should leave the bandage intact for twenty four hours unless there is drainage.  If the epidural site drains for more than 36-48 hours please call the anesthesia department.  QUESTIONS?:  Please feel free to call your physician or the hospital operator if you have any questions, and they will be happy to assist you.     [facility] Anesthesia Department [address] [place] [phone]

## 2011-08-18 ENCOUNTER — Encounter (HOSPITAL_COMMUNITY): Payer: Self-pay

## 2011-08-18 ENCOUNTER — Other Ambulatory Visit: Payer: Self-pay

## 2011-08-18 ENCOUNTER — Encounter (HOSPITAL_COMMUNITY)
Admission: RE | Admit: 2011-08-18 | Discharge: 2011-08-18 | Disposition: A | Discharge status: Home / Self Care | Payer: Medicare Other | Source: Ambulatory Visit | Attending: Urology | Admitting: Urology

## 2011-08-18 HISTORY — DX: Benign prostatic hyperplasia without lower urinary tract symptoms: N40.0

## 2011-08-18 HISTORY — DX: Depression, unspecified: F32.A

## 2011-08-18 HISTORY — DX: Major depressive disorder, single episode, unspecified: F32.9

## 2011-08-18 HISTORY — DX: Unqualified visual loss, both eyes: H54.3

## 2011-08-18 HISTORY — DX: Anxiety disorder, unspecified: F41.9

## 2011-08-18 LAB — SURGICAL PCR SCREEN
MRSA, PCR: NEGATIVE
Staphylococcus aureus: NEGATIVE

## 2011-08-18 LAB — BASIC METABOLIC PANEL
BUN: 19 mg/dL (ref 6–23)
Chloride: 108 mEq/L (ref 96–112)
Creatinine, Ser: 1.01 mg/dL (ref 0.50–1.35)
GFR calc Af Amer: 79 mL/min — ABNORMAL LOW (ref >90)

## 2011-08-18 LAB — HEMOGLOBIN AND HEMATOCRIT, BLOOD: Hemoglobin: 11 g/dL — ABNORMAL LOW (ref 13.0–17.0)

## 2011-08-18 NOTE — Pre-Procedure Instructions (Signed)
Pts son states he stopped plavix on 08-15-2011.

## 2011-08-22 ENCOUNTER — Encounter (HOSPITAL_COMMUNITY)
Admission: RE | Disposition: A | Discharge status: Home / Self Care | Payer: Self-pay | Source: Ambulatory Visit | Attending: Urology

## 2011-08-22 ENCOUNTER — Encounter (HOSPITAL_COMMUNITY): Payer: Self-pay | Admitting: Anesthesiology

## 2011-08-22 ENCOUNTER — Ambulatory Visit (HOSPITAL_COMMUNITY): Payer: Medicare Other | Admitting: Anesthesiology

## 2011-08-22 ENCOUNTER — Encounter (HOSPITAL_COMMUNITY): Payer: Self-pay | Admitting: *Deleted

## 2011-08-22 ENCOUNTER — Observation Stay (HOSPITAL_COMMUNITY)
Admission: RE | Admit: 2011-08-22 | Discharge: 2011-08-24 | Disposition: A | Discharge status: Home / Self Care | Payer: Medicare Other | Source: Ambulatory Visit | Attending: Urology | Admitting: Urology

## 2011-08-22 ENCOUNTER — Encounter (HOSPITAL_COMMUNITY): Payer: Self-pay

## 2011-08-22 DIAGNOSIS — N400 Enlarged prostate without lower urinary tract symptoms: Principal | ICD-10-CM | POA: Insufficient documentation

## 2011-08-22 DIAGNOSIS — Z8673 Personal history of transient ischemic attack (TIA), and cerebral infarction without residual deficits: Secondary | ICD-10-CM | POA: Insufficient documentation

## 2011-08-22 DIAGNOSIS — Z951 Presence of aortocoronary bypass graft: Secondary | ICD-10-CM | POA: Insufficient documentation

## 2011-08-22 DIAGNOSIS — I10 Essential (primary) hypertension: Secondary | ICD-10-CM | POA: Insufficient documentation

## 2011-08-22 DIAGNOSIS — K219 Gastro-esophageal reflux disease without esophagitis: Secondary | ICD-10-CM | POA: Insufficient documentation

## 2011-08-22 DIAGNOSIS — Z5309 Procedure and treatment not carried out because of other contraindication: Secondary | ICD-10-CM | POA: Insufficient documentation

## 2011-08-22 DIAGNOSIS — J4489 Other specified chronic obstructive pulmonary disease: Secondary | ICD-10-CM | POA: Insufficient documentation

## 2011-08-22 DIAGNOSIS — J449 Chronic obstructive pulmonary disease, unspecified: Secondary | ICD-10-CM | POA: Insufficient documentation

## 2011-08-22 DIAGNOSIS — Z01812 Encounter for preprocedural laboratory examination: Secondary | ICD-10-CM | POA: Insufficient documentation

## 2011-08-22 DIAGNOSIS — Z01810 Encounter for preprocedural cardiovascular examination: Secondary | ICD-10-CM | POA: Insufficient documentation

## 2011-08-22 DIAGNOSIS — D508 Other iron deficiency anemias: Secondary | ICD-10-CM | POA: Insufficient documentation

## 2011-08-22 DIAGNOSIS — Z7902 Long term (current) use of antithrombotics/antiplatelets: Secondary | ICD-10-CM | POA: Insufficient documentation

## 2011-08-22 DIAGNOSIS — E538 Deficiency of other specified B group vitamins: Secondary | ICD-10-CM

## 2011-08-22 DIAGNOSIS — K449 Diaphragmatic hernia without obstruction or gangrene: Secondary | ICD-10-CM | POA: Insufficient documentation

## 2011-08-22 HISTORY — DX: Headache: R51

## 2011-08-22 HISTORY — DX: Gastro-esophageal reflux disease without esophagitis: K21.9

## 2011-08-22 SURGERY — TURP (TRANSURETHRAL RESECTION OF PROSTATE)
Anesthesia: Spinal

## 2011-08-22 MED ORDER — LIDOCAINE HCL (CARDIAC) 10 MG/ML IV SOLN
INTRAVENOUS | Status: DC | PRN
  Administered 2011-08-22: 25 mg via INTRAVENOUS

## 2011-08-22 MED ORDER — METOPROLOL TARTRATE 50 MG PO TABS
50.0000 mg | ORAL_TABLET | Freq: Every day | ORAL | Status: DC
  Administered 2011-08-22 – 2011-08-23 (×2): 50 mg via ORAL
  Filled 2011-08-22 (×2): qty 1

## 2011-08-22 MED ORDER — EPHEDRINE SULFATE 50 MG/ML IJ SOLN
INTRAMUSCULAR | Status: AC
  Filled 2011-08-22: qty 1

## 2011-08-22 MED ORDER — BIMATOPROST 0.01 % OP SOLN
1.0000 [drp] | Freq: Every day | OPHTHALMIC | Status: DC
  Administered 2011-08-22 – 2011-08-23 (×2): 1 [drp] via OPHTHALMIC
  Filled 2011-08-22: qty 2.5

## 2011-08-22 MED ORDER — ONDANSETRON HCL 4 MG/2ML IJ SOLN: 4.0000 mg | Freq: Once | INTRAMUSCULAR | Status: DC | PRN

## 2011-08-22 MED ORDER — PROPOFOL 10 MG/ML IV EMUL
INTRAVENOUS | Status: AC
  Filled 2011-08-22: qty 20

## 2011-08-22 MED ORDER — ERYTHROMYCIN 5 MG/GM OP OINT
1.0000 "application " | TOPICAL_OINTMENT | Freq: Two times a day (BID) | OPHTHALMIC | Status: DC
  Administered 2011-08-22 – 2011-08-23 (×3): 1 via OPHTHALMIC
  Filled 2011-08-22: qty 3.5

## 2011-08-22 MED ORDER — FERROUS SULFATE 325 (65 FE) MG PO TABS
325.0000 mg | ORAL_TABLET | Freq: Every day | ORAL | Status: DC
  Administered 2011-08-22 – 2011-08-23 (×2): 325 mg via ORAL
  Filled 2011-08-22 (×2): qty 1

## 2011-08-22 MED ORDER — ROSUVASTATIN CALCIUM 20 MG PO TABS
20.0000 mg | ORAL_TABLET | Freq: Every day | ORAL | Status: DC
  Administered 2011-08-22: 20 mg via ORAL
  Filled 2011-08-22: qty 1

## 2011-08-22 MED ORDER — BRIMONIDINE TARTRATE 0.2 % OP SOLN
1.0000 [drp] | Freq: Two times a day (BID) | OPHTHALMIC | Status: DC
  Administered 2011-08-22 – 2011-08-23 (×4): 1 [drp] via OPHTHALMIC
  Filled 2011-08-22: qty 5

## 2011-08-22 MED ORDER — PROMETHAZINE HCL 12.5 MG PO TABS
25.0000 mg | ORAL_TABLET | Freq: Four times a day (QID) | ORAL | Status: DC | PRN
  Administered 2011-08-23: 25 mg via ORAL
  Filled 2011-08-22 (×2): qty 1

## 2011-08-22 MED ORDER — PROPOFOL 10 MG/ML IV EMUL
INTRAVENOUS | Status: DC | PRN
  Administered 2011-08-22: 50 ug/kg/min via INTRAVENOUS

## 2011-08-22 MED ORDER — BRIMONIDINE TARTRATE-TIMOLOL 0.2-0.5 % OP SOLN: 1.0000 [drp] | Freq: Two times a day (BID) | OPHTHALMIC | Status: DC

## 2011-08-22 MED ORDER — FOLIC ACID 1 MG PO TABS
1.0000 mg | ORAL_TABLET | Freq: Every day | ORAL | Status: DC
  Administered 2011-08-22 – 2011-08-23 (×2): 1 mg via ORAL
  Filled 2011-08-22 (×2): qty 1

## 2011-08-22 MED ORDER — LACTATED RINGERS IV SOLN
INTRAVENOUS | Status: DC | PRN
  Administered 2011-08-22: 09:00:00 via INTRAVENOUS

## 2011-08-22 MED ORDER — FENTANYL CITRATE 0.05 MG/ML IJ SOLN: 25.0000 ug | INTRAMUSCULAR | Status: DC | PRN

## 2011-08-22 MED ORDER — TAMSULOSIN HCL 0.4 MG PO CAPS
0.4000 mg | ORAL_CAPSULE | Freq: Every day | ORAL | Status: DC
  Administered 2011-08-22: 0.4 mg via ORAL
  Filled 2011-08-22: qty 1

## 2011-08-22 MED ORDER — MIDAZOLAM HCL 2 MG/2ML IJ SOLN
INTRAMUSCULAR | Status: AC
  Filled 2011-08-22: qty 2

## 2011-08-22 MED ORDER — BUPIVACAINE IN DEXTROSE 0.75-8.25 % IT SOLN
INTRATHECAL | Status: AC
  Filled 2011-08-22: qty 2

## 2011-08-22 MED ORDER — POLYETHYLENE GLYCOL 3350 17 G PO PACK: 17.0000 g | PACK | Freq: Every day | ORAL | Status: DC | PRN

## 2011-08-22 MED ORDER — LACTATED RINGERS IV SOLN
INTRAVENOUS | Status: DC
  Administered 2011-08-23: 1000 mL via INTRAVENOUS
  Administered 2011-08-23: 18:00:00 via INTRAVENOUS

## 2011-08-22 MED ORDER — LORAZEPAM 1 MG PO TABS
1.0000 mg | ORAL_TABLET | Freq: Every day | ORAL | Status: DC | PRN
  Administered 2011-08-22 – 2011-08-23 (×2): 1 mg via ORAL
  Filled 2011-08-22 (×2): qty 1

## 2011-08-22 MED ORDER — BIMATOPROST 0.03 % OP SOLN
1.0000 [drp] | Freq: Every day | OPHTHALMIC | Status: DC
  Filled 2011-08-22: qty 2.5

## 2011-08-22 MED ORDER — DOCUSATE SODIUM 100 MG PO CAPS
100.0000 mg | ORAL_CAPSULE | Freq: Every day | ORAL | Status: DC
  Administered 2011-08-22 – 2011-08-23 (×2): 100 mg via ORAL
  Filled 2011-08-22 (×2): qty 1

## 2011-08-22 MED ORDER — LISINOPRIL 10 MG PO TABS
40.0000 mg | ORAL_TABLET | Freq: Every day | ORAL | Status: DC
  Administered 2011-08-22 – 2011-08-23 (×2): 40 mg via ORAL
  Filled 2011-08-22 (×2): qty 4

## 2011-08-22 MED ORDER — LACTATED RINGERS IV SOLN
INTRAVENOUS | Status: DC
  Administered 2011-08-22: 09:00:00 via INTRAVENOUS

## 2011-08-22 MED ORDER — FENTANYL CITRATE 0.05 MG/ML IJ SOLN
INTRAMUSCULAR | Status: AC
  Filled 2011-08-22: qty 2

## 2011-08-22 MED ORDER — ONDANSETRON 4 MG PO TBDP: 8.0000 mg | ORAL_TABLET | Freq: Three times a day (TID) | ORAL | Status: DC | PRN

## 2011-08-22 MED ORDER — BIMATOPROST 0.01 % OP SOLN
1.0000 [drp] | Freq: Every day | OPHTHALMIC | Status: DC
  Filled 2011-08-22: qty 2.5

## 2011-08-22 MED ORDER — TIMOLOL MALEATE 0.5 % OP SOLN
1.0000 [drp] | Freq: Two times a day (BID) | OPHTHALMIC | Status: DC
  Administered 2011-08-22 – 2011-08-23 (×3): 1 [drp] via OPHTHALMIC
  Filled 2011-08-22: qty 5

## 2011-08-22 MED ORDER — LIDOCAINE HCL (PF) 1 % IJ SOLN
INTRAMUSCULAR | Status: AC
  Filled 2011-08-22: qty 5

## 2011-08-22 MED ORDER — MIDAZOLAM HCL 2 MG/2ML IJ SOLN
1.0000 mg | INTRAMUSCULAR | Status: DC | PRN
  Administered 2011-08-22: 2 mg via INTRAVENOUS

## 2011-08-22 MED ORDER — SENNA 8.6 MG PO TABS: 1.0000 | ORAL_TABLET | Freq: Every day | ORAL | Status: DC | PRN

## 2011-08-22 SURGICAL SUPPLY — 32 items
BAG DECANTER FOR FLEXI CONT (MISCELLANEOUS) IMPLANT
BAG DRAIN URO TABLE W/ADPT NS (DRAPE) IMPLANT
BAG URINE DRAIN TURP 4L (OSTOMY) IMPLANT
CABLE HI FREQUENCY MONOPOLAR (ELECTROSURGICAL) IMPLANT
CATH FOLEY 3WAY 30CC 22F (CATHETERS) IMPLANT
CLOTH BEACON ORANGE TIMEOUT ST (SAFETY) IMPLANT
CONNECTOR 5 IN 1 STRAIGHT STRL (MISCELLANEOUS) IMPLANT
DRAPE STERI URO 23X35 APER SZ5 (DRAPE) IMPLANT
ELECT CUT LOOP C-MAX 27FR .012 (CUTTING LOOP)
ELECT REM PT RETURN 9FT ADLT (ELECTROSURGICAL)
ELECTRODE CUT LP CMX 27FR .012 (CUTTING LOOP) IMPLANT
ELECTRODE REM PT RTRN 9FT ADLT (ELECTROSURGICAL) IMPLANT
FLOOR PAD 36X40 (MISCELLANEOUS)
FORMALIN 10 PREFIL 480ML (MISCELLANEOUS) IMPLANT
GLOVE BIO SURGEON STRL SZ7 (GLOVE) IMPLANT
GLOVE BIOGEL PI IND STRL 6.5 (GLOVE) IMPLANT
GLOVE BIOGEL PI INDICATOR 6.5 (GLOVE)
GLOVE ECLIPSE 6.5 STRL STRAW (GLOVE) IMPLANT
GLYCINE 1.5% IRRIG UROMATIC (IV SOLUTION) IMPLANT
GOWN STRL REIN XL XLG (GOWN DISPOSABLE) IMPLANT
IV NS IRRIG 3000ML ARTHROMATIC (IV SOLUTION) IMPLANT
KIT ROOM TURNOVER AP CYSTO (KITS) IMPLANT
MANIFOLD NEPTUNE II (INSTRUMENTS) IMPLANT
PACK CYSTO (CUSTOM PROCEDURE TRAY) IMPLANT
PAD ARMBOARD 7.5X6 YLW CONV (MISCELLANEOUS) IMPLANT
PAD FLOOR 36X40 (MISCELLANEOUS) IMPLANT
SET IRRIGATING DISP (SET/KITS/TRAYS/PACK) IMPLANT
SYR 30ML LL (SYRINGE) IMPLANT
TOWEL OR 17X26 4PK STRL BLUE (TOWEL DISPOSABLE) IMPLANT
WATER STERILE IRR 1000ML POUR (IV SOLUTION) IMPLANT
XPEEDA 550 SIDEFIRING FIBER (MISCELLANEOUS) IMPLANT
YANKAUER SUCT BULB TIP 10FT TU (MISCELLANEOUS) IMPLANT

## 2011-08-22 NOTE — Transfer of Care (Signed)
Immediate Anesthesia Transfer of Care Note  Patient: [name]  Procedure(s) Performed: Procedure(s) (LRB): TRANSURETHRAL RESECTION OF THE PROSTATE (TURP) (N/A)  Patient Location: PACU  Anesthesia Type: MAC  Level of Consciousness: awake, alert , oriented and patient cooperative  Airway & Oxygen Therapy: Patient Spontanous Breathing and Patient connected to face mask oxygen  Post-op Assessment: Report given to PACU RN and Post -op Vital signs reviewed and stable  Post vital signs: Reviewed and stable  Complications: No apparent anesthesia complications

## 2011-08-22 NOTE — Anesthesia Procedure Notes (Addendum)
Performed by: Corena Pilgrim   Procedure Name: MAC Date/Time: 08/22/2011 9:43 AM Performed by: Carolyne Littles, Kayle Correa Pre-anesthesia Checklist: Patient identified, Timeout performed, Emergency Drugs available, Suction available and Patient being monitored Oxygen Delivery Method: Simple face mask

## 2011-08-22 NOTE — Anesthesia Postprocedure Evaluation (Signed)
  Anesthesia Post-op Note  Patient: [name]  Procedure(s) Performed: Procedure(s) (LRB): TRANSURETHRAL RESECTION OF THE PROSTATE (TURP) (N/A)  Patient Location: PACU  Anesthesia Type: MAC  Level of Consciousness: awake, alert , oriented and patient cooperative  Airway and Oxygen Therapy: Patient Spontanous Breathing and Patient connected to nasal cannula oxygen  Post-op Pain: none  Post-op Assessment: Post-op Vital signs reviewed, Patient's Cardiovascular Status Stable and Respiratory Function Stable  Post-op Vital Signs: Reviewed and stable  Complications: No apparent anesthesia complications

## 2011-08-22 NOTE — Progress Notes (Signed)
[phone]

## 2011-08-22 NOTE — H&P (Signed)
[name], [name]                  ACCOUNT NO.:  [id number]  MEDICAL RECORD NO.:  [id number]  LOCATION:                                 FACILITY:  PHYSICIAN:  Ky Barban, M.D.DATE OF BIRTH:  [birth year]  DATE OF ADMISSION: DATE OF DISCHARGE:  LH                             HISTORY & PHYSICAL   CHIEF COMPLAINT:  Symptoms of prostatism.  HISTORY:  This patient who is 78 years old, has multiple medical problems, poor historian.  I obtained most of the history from the records.  He was referred to me by Dr. Renard Matter.  He has a longstanding history of prostatism, has BPH, has been unable to void on Flomax, but continued to have symptoms.  He developed gross hematuria and was referred to me.  He has been to the emergency room where he was evaluated by the ER physician and cystoscopy systematics was done in the office.  His gross hematuria has cleared up.  He does have BPH with bladder neck obstruction, and CT with and without contrast was negative urinary tract.  PAST MEDICAL HISTORY:  He has hypertension, which he takes medicines for that.  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  It was done at [facility], [facility], and he has been recovered from it fine.  No history of diabetes.  His past history does include coronary artery bypass graft.  MEDICATIONS:  He is taking Zocor, lisinopril, metoprolol, Plavix which has been discontinued, aspirin which is discontinued, folic acid, Flomax, Avodart, and simvastatin.  FAMILY HISTORY:  No history of prostate cancer.  PERSONAL HISTORY:  He smokes 1 pack a day.  Does not use alcohol.  REVIEW OF SYSTEMS:  Unremarkable.  PHYSICAL EXAMINATION:  GENERAL:  Moderately built, not in acute distress. VITAL SIGNS:  Blood pressure 126/61, temperature 97.6. CENTRAL NERVOUS SYSTEM:  No gross neurological deficit. HEAD, NECK, EYES, ENT:   Negative. CHEST:  Symmetrical. HEART:  Regular sinus rhythm.  No murmur. ABDOMEN:  Soft, flat.  Liver, spleen, kidneys not palpable. EXTERNAL GENITALIA:  Unremarkable. RECTAL:  Normal sphincter tone.  Prostate 1.5+, smooth, and firm.  IMPRESSION: 1. Benign prostatic hypertrophy with bladder neck obstruction. 2. Hypertension.  RECOMMENDATIONS:  Recommend TUR prostate.  I discussed these findings with the patient and his son, told them that he will be better after having a TUR prostate because Flomax and Avodart is not helping him, so they are agreeable.  The patient, after I told them about TUR prostate with this possible complications, they agreed and will be coming as outpatient, will undergo TUR prostate, will stay overnight in the hospital.  Next day, if everything is fine, he will go home with a catheter.     Ky Barban, M.D.     MIJ/MEDQ  D:  08/21/2011  T:  08/22/2011  Job:  [id number]  cc:   Angus G. Renard Matter, MD Fax: [phone]

## 2011-08-22 NOTE — OR Nursing (Signed)
Lab in to draw labs for  Type and cross,  Set up 2units per order DR. Jayme Cloud.

## 2011-08-22 NOTE — Progress Notes (Signed)
Awake. Drinking coffee. Waiting on bed assignment.

## 2011-08-22 NOTE — Progress Notes (Signed)
Sleeping quietly on lt side. Bed control contacted about bed request. No bed available at this time. Waiting on discharges for bed assignment. Will continue to monitor.

## 2011-08-22 NOTE — Preoperative (Signed)
Beta Blockers   Reason not to administer Beta Blockers:Not Applicable 

## 2011-08-22 NOTE — Progress Notes (Signed)
Sleeping quietly. Continue waiting on bed assignment.

## 2011-08-22 NOTE — Progress Notes (Signed)
Continue waiting on bed availability. 

## 2011-08-22 NOTE — Progress Notes (Signed)
From OR. Awake. Talking. Surgery canceled because unable to crossmatch blood for to many antibodies. Surgery rescheduled for in AM. Had no narcotics in OR but was given propofol. Brought to recovery for monitoring.

## 2011-08-22 NOTE — Anesthesia Preprocedure Evaluation (Addendum)
Anesthesia Evaluation  Patient identified by MRN, date of birth, ID band Patient awake    Reviewed: Allergy & Precautions, H&P , NPO status , Patient's Chart, lab work & pertinent test results, reviewed documented beta blocker date and time   Airway Mallampati: I      Dental  (+) Edentulous Upper and Edentulous Lower   Pulmonary COPDCurrent Smoker,    Pulmonary exam normal       Cardiovascular hypertension, + CAD, + Past MI, + CABG and + Peripheral Vascular Disease Regular Normal    Neuro/Psych PSYCHIATRIC DISORDERS Anxiety Depression CVA    GI/Hepatic GERD-  Medicated and Controlled,  Endo/Other    Renal/GU      Musculoskeletal   Abdominal   Peds  Hematology   Anesthesia Other Findings   Reproductive/Obstetrics                           Anesthesia Physical Anesthesia Plan  ASA: IV  Anesthesia Plan: Spinal   Post-op Pain Management:    Induction: Intravenous  Airway Management Planned: Nasal Cannula  Additional Equipment:   Intra-op Plan:   Post-operative Plan:   Informed Consent: I have reviewed the patients History and Physical, chart, labs and discussed the procedure including the risks, benefits and alternatives for the proposed anesthesia with the patient or authorized representative who has indicated his/her understanding and acceptance.     Plan Discussed with:   Anesthesia Plan Comments:         Anesthesia Quick Evaluation

## 2011-08-22 NOTE — Progress Notes (Signed)
No change on reexaminationin H&P. 

## 2011-08-22 NOTE — OR Nursing (Signed)
Pt. Took lopressor 08/21/11,   Dr. Jerre Simon made aware.

## 2011-08-23 ENCOUNTER — Encounter (HOSPITAL_COMMUNITY): Payer: Self-pay | Admitting: *Deleted

## 2011-08-23 ENCOUNTER — Ambulatory Visit (HOSPITAL_COMMUNITY): Payer: Medicare Other | Admitting: Anesthesiology

## 2011-08-23 ENCOUNTER — Encounter (HOSPITAL_COMMUNITY): Payer: Self-pay | Admitting: Anesthesiology

## 2011-08-23 ENCOUNTER — Encounter (HOSPITAL_COMMUNITY)
Admission: RE | Disposition: A | Discharge status: Home / Self Care | Payer: Self-pay | Source: Ambulatory Visit | Attending: Urology

## 2011-08-23 ENCOUNTER — Other Ambulatory Visit: Payer: Self-pay | Admitting: Urology

## 2011-08-23 HISTORY — PX: TRANSURETHRAL RESECTION OF PROSTATE: SHX73

## 2011-08-23 SURGERY — TURP (TRANSURETHRAL RESECTION OF PROSTATE)
Anesthesia: Spinal | Wound class: Clean Contaminated

## 2011-08-23 MED ORDER — OXYCODONE-ACETAMINOPHEN 5-325 MG PO TABS
1.0000 | ORAL_TABLET | Freq: Four times a day (QID) | ORAL | Status: DC | PRN
  Administered 2011-08-23 – 2011-08-24 (×2): 1 via ORAL
  Filled 2011-08-23 (×2): qty 1

## 2011-08-23 MED ORDER — EPHEDRINE SULFATE 50 MG/ML IJ SOLN
INTRAMUSCULAR | Status: AC
  Filled 2011-08-23: qty 1

## 2011-08-23 MED ORDER — FENTANYL CITRATE 0.05 MG/ML IJ SOLN
INTRAMUSCULAR | Status: DC | PRN
  Administered 2011-08-23: 25 ug via INTRAVENOUS
  Administered 2011-08-23: 50 ug via INTRAVENOUS
  Administered 2011-08-23: 25 ug via INTRAVENOUS

## 2011-08-23 MED ORDER — MORPHINE SULFATE 2 MG/ML IJ SOLN
1.0000 mg | INTRAMUSCULAR | Status: DC | PRN
  Administered 2011-08-23 – 2011-08-24 (×6): 1 mg via INTRAVENOUS
  Filled 2011-08-23 (×6): qty 1

## 2011-08-23 MED ORDER — LACTATED RINGERS IV SOLN
INTRAVENOUS | Status: DC | PRN
  Administered 2011-08-23 (×2): via INTRAVENOUS

## 2011-08-23 MED ORDER — PROPOFOL 10 MG/ML IV EMUL
INTRAVENOUS | Status: AC
  Filled 2011-08-23: qty 20

## 2011-08-23 MED ORDER — GLYCINE 1.5 % IR SOLN
Status: DC | PRN
  Administered 2011-08-23 (×6): 3000 mL

## 2011-08-23 MED ORDER — ONDANSETRON HCL 4 MG/2ML IJ SOLN: 4.0000 mg | Freq: Once | INTRAMUSCULAR | Status: DC | PRN

## 2011-08-23 MED ORDER — STERILE WATER FOR IRRIGATION IR SOLN
Status: DC | PRN
  Administered 2011-08-23: 1000 mL

## 2011-08-23 MED ORDER — FENTANYL CITRATE 0.05 MG/ML IJ SOLN
INTRAMUSCULAR | Status: AC
  Filled 2011-08-23: qty 2

## 2011-08-23 MED ORDER — LACTATED RINGERS IV SOLN: INTRAVENOUS | Status: DC

## 2011-08-23 MED ORDER — BUPIVACAINE IN DEXTROSE 0.75-8.25 % IT SOLN
INTRATHECAL | Status: AC
  Filled 2011-08-23: qty 2

## 2011-08-23 MED ORDER — FENTANYL CITRATE 0.05 MG/ML IJ SOLN
INTRAMUSCULAR | Status: DC | PRN
  Administered 2011-08-23: 25 ug via INTRATHECAL

## 2011-08-23 MED ORDER — BUPIVACAINE HCL 0.75 % IJ SOLN
INTRAMUSCULAR | Status: DC | PRN
  Administered 2011-08-23: 2 mL via INTRATHECAL

## 2011-08-23 MED ORDER — MIDAZOLAM HCL 2 MG/2ML IJ SOLN
INTRAMUSCULAR | Status: AC
  Filled 2011-08-23: qty 2

## 2011-08-23 MED ORDER — PROPOFOL 10 MG/ML IV EMUL
INTRAVENOUS | Status: DC | PRN
  Administered 2011-08-23: 50 ug/kg/min via INTRAVENOUS

## 2011-08-23 MED ORDER — MIDAZOLAM HCL 2 MG/2ML IJ SOLN
1.0000 mg | INTRAMUSCULAR | Status: DC | PRN
  Administered 2011-08-23: 2 mg via INTRAVENOUS

## 2011-08-23 MED ORDER — FENTANYL CITRATE 0.05 MG/ML IJ SOLN: 25.0000 ug | INTRAMUSCULAR | Status: DC | PRN

## 2011-08-23 MED ORDER — LIDOCAINE HCL (CARDIAC) 10 MG/ML IV SOLN
INTRAVENOUS | Status: DC | PRN
  Administered 2011-08-23: 25 mg via INTRAVENOUS

## 2011-08-23 MED ORDER — LIDOCAINE HCL (PF) 1 % IJ SOLN
INTRAMUSCULAR | Status: AC
  Filled 2011-08-23: qty 5

## 2011-08-23 SURGICAL SUPPLY — 35 items
BAG DECANTER FOR FLEXI CONT (MISCELLANEOUS) ×2 IMPLANT
BAG DRAIN URO TABLE W/ADPT NS (DRAPE) ×2 IMPLANT
BAG URINE DRAIN TURP 4L (OSTOMY) ×2 IMPLANT
CABLE HI FREQUENCY MONOPOLAR (ELECTROSURGICAL) ×2 IMPLANT
CATH FOLEY 3WAY 30CC 22F (CATHETERS) ×2 IMPLANT
CLOTH BEACON ORANGE TIMEOUT ST (SAFETY) ×2 IMPLANT
CONNECTOR 5 IN 1 STRAIGHT STRL (MISCELLANEOUS) IMPLANT
DRAPE STERI URO 23X35 APER SZ5 (DRAPE) ×2 IMPLANT
ELECT CUT LOOP C-MAX 27FR .012 (CUTTING LOOP) ×2
ELECT REM PT RETURN 9FT ADLT (ELECTROSURGICAL) ×2
ELECTRODE CUT LP CMX 27FR .012 (CUTTING LOOP) ×1 IMPLANT
ELECTRODE REM PT RTRN 9FT ADLT (ELECTROSURGICAL) ×1 IMPLANT
FLOOR PAD 36X40 (MISCELLANEOUS)
FORMALIN 10 PREFIL 480ML (MISCELLANEOUS) ×2 IMPLANT
GLOVE BIO SURGEON STRL SZ7 (GLOVE) ×2 IMPLANT
GLOVE BIOGEL PI IND STRL 6.5 (GLOVE) ×1 IMPLANT
GLOVE BIOGEL PI IND STRL 7.0 (GLOVE) ×1 IMPLANT
GLOVE BIOGEL PI INDICATOR 6.5 (GLOVE) ×1
GLOVE BIOGEL PI INDICATOR 7.0 (GLOVE) ×1
GLOVE ECLIPSE 6.5 STRL STRAW (GLOVE) ×2 IMPLANT
GLOVE ECLIPSE 7.0 STRL STRAW (GLOVE) ×4 IMPLANT
GLYCINE 1.5% IRRIG UROMATIC (IV SOLUTION) ×12 IMPLANT
GOWN STRL REIN XL XLG (GOWN DISPOSABLE) ×2 IMPLANT
IV NS IRRIG 3000ML ARTHROMATIC (IV SOLUTION) ×2 IMPLANT
KIT ROOM TURNOVER AP CYSTO (KITS) ×2 IMPLANT
MANIFOLD NEPTUNE II (INSTRUMENTS) ×2 IMPLANT
PACK CYSTO (CUSTOM PROCEDURE TRAY) ×2 IMPLANT
PAD ARMBOARD 7.5X6 YLW CONV (MISCELLANEOUS) ×2 IMPLANT
PAD FLOOR 36X40 (MISCELLANEOUS) IMPLANT
SET IRRIGATING DISP (SET/KITS/TRAYS/PACK) ×2 IMPLANT
SYR 30ML LL (SYRINGE) ×2 IMPLANT
TOWEL OR 17X26 4PK STRL BLUE (TOWEL DISPOSABLE) ×2 IMPLANT
WATER STERILE IRR 1000ML POUR (IV SOLUTION) ×2 IMPLANT
XPEEDA 550 SIDEFIRING FIBER (MISCELLANEOUS) IMPLANT
YANKAUER SUCT BULB TIP 10FT TU (MISCELLANEOUS) ×2 IMPLANT

## 2011-08-23 NOTE — Transfer of Care (Signed)
Immediate Anesthesia Transfer of Care Note  Patient: [name]  Procedure(s) Performed: Procedure(s) (LRB): TRANSURETHRAL RESECTION OF THE PROSTATE (TURP) (N/A)  Patient Location: PACU  Anesthesia Type: Spinal  Level of Consciousness: awake, alert , oriented and patient cooperative  Airway & Oxygen Therapy: Patient Spontanous Breathing  Post-op Assessment: Report given to PACU RN and Post -op Vital signs reviewed and stable  Post vital signs: Reviewed and stable  Complications: No apparent anesthesia complications

## 2011-08-23 NOTE — Anesthesia Postprocedure Evaluation (Signed)
  Anesthesia Post-op Note  Patient: [name]  Procedure(s) Performed: Procedure(s) (LRB): TRANSURETHRAL RESECTION OF THE PROSTATE (TURP) (N/A)  Patient Location: PACU  Anesthesia Type: Spinal  Level of Consciousness: awake, alert , oriented and patient cooperative  Airway and Oxygen Therapy: Patient Spontanous Breathing and Patient connected to nasal cannula oxygen  Post-op Pain: none  Post-op Assessment: Post-op Vital signs reviewed, Patient's Cardiovascular Status Stable, Respiratory Function Stable and Patent Airway  Post-op Vital Signs: Reviewed and stable  Complications: No apparent anesthesia complications

## 2011-08-23 NOTE — Anesthesia Preprocedure Evaluation (Signed)
Anesthesia Evaluation  Patient identified by MRN, date of birth, ID band Patient awake    Reviewed: Allergy & Precautions, H&P , NPO status , Patient's Chart, lab work & pertinent test results, reviewed documented beta blocker date and time   Airway Mallampati: I      Dental  (+) Edentulous Upper and Edentulous Lower   Pulmonary COPDCurrent Smoker,    Pulmonary exam normal       Cardiovascular hypertension, + CAD, + Past MI, + CABG and + Peripheral Vascular Disease Regular Normal    Neuro/Psych PSYCHIATRIC DISORDERS Anxiety Depression CVA    GI/Hepatic GERD-  Medicated and Controlled,  Endo/Other    Renal/GU      Musculoskeletal   Abdominal   Peds  Hematology   Anesthesia Other Findings   Reproductive/Obstetrics                           Anesthesia Physical Anesthesia Plan  ASA: IV  Anesthesia Plan: Spinal   Post-op Pain Management:    Induction:   Airway Management Planned: Nasal Cannula  Additional Equipment:   Intra-op Plan:   Post-operative Plan:   Informed Consent: I have reviewed the patients History and Physical, chart, labs and discussed the procedure including the risks, benefits and alternatives for the proposed anesthesia with the patient or authorized representative who has indicated his/her understanding and acceptance.     Plan Discussed with:   Anesthesia Plan Comments:         Anesthesia Quick Evaluation

## 2011-08-23 NOTE — Anesthesia Procedure Notes (Signed)
Spinal  Start time: 08/23/2011 8:06 AM Staffing CRNA/Resident: ANDRAZA, Mylon Mabey Preanesthetic Checklist Completed: patient identified, site marked, surgical consent, pre-op evaluation, timeout performed, IV checked, risks and benefits discussed and monitors and equipment checked Spinal Block Patient position: left lateral decubitus Prep: Betadine and site prepped and draped Patient monitoring: heart rate, cardiac monitor, continuous pulse ox and blood pressure Approach: left paramedian Location: L4-5 Injection technique: single-shot Needle Needle type: Spinocan  Needle length: 9 cm Assessment Sensory level: T10 Additional Notes 0806 Sensorcaine .75% 2.0 cc with fentanyl 25 mcg injected intrathecally  Lot [id number]  Exp 05-2012

## 2011-08-23 NOTE — Progress Notes (Signed)
NAME:  SERENITY, FORTNER NO.:  [id number]  MEDICAL RECORD NO.:  [id number]  LOCATION:  A308                          FACILITY:  [facility]  PHYSICIAN:  Ky Barban, M.D.DATE OF BIRTH:  Jul 26, 1928  DATE OF PROCEDURE:  08/22/2011 DATE OF DISCHARGE:                                PROGRESS NOTE   Mr. Slusher is brought to undergo TUR prostate today, but I decided that he should have blood available before TUR prostate.  Although his hematocrit is around 33%, but he was on Plavix and I am concerned that during the procedure he might need blood.  Blood was not arranged preoperatively, so when we requested, they told me it will take 5 or 6 hours, so I decided to reschedule the patient.  His procedure of TUR prostate was canceled today just because of that, and we will bring him and do a TUR prostate in the morning.  I have discussed this with the patient and his family, they understand.     Ky Barban, M.D.     MIJ/MEDQ  D:  08/22/2011  T:  08/23/2011  Job:  [id number]

## 2011-08-23 NOTE — Preoperative (Signed)
Beta Blockers   Reason not to administer Beta Blockers:Not Applicable 

## 2011-08-23 NOTE — Brief Op Note (Signed)
08/22/2011 - 08/23/2011  9:02 AM  PATIENT:  [name]  78 y.o. male  PRE-OPERATIVE DIAGNOSIS:  Benign Prostatic Hypertrophy with Bladder Neck Obstruction  POST-OPERATIVE DIAGNOSIS:  Benign Prostatic Hypertrophy with Bladder Neck Obstruction  PROCEDURE:  Procedure(s) (LRB): TRANSURETHRAL RESECTION OF THE PROSTATE (TURP) (N/A)  SURGEON:  Surgeon(s) and Role:    * Ky Barban, MD - Primary  PHYSICIAN ASSISTANT:   ASSISTANTS: none   ANESTHESIA:   spinal  EBL:  Total I/O In: 1200 [I.V.:1200] Out: -   BLOOD ADMINISTERED:none  DRAINS: Urinary Catheter (Foley)   LOCAL MEDICATIONS USED:  NONE  SPECIMEN:  Source of Specimen:  protate chips  DISPOSITION OF SPECIMEN:  PATHOLOGY  COUNTS:  YES  TOURNIQUET:  * No tourniquets in log *  DICTATION: .Other Dictation: Dictation Number [id number]  PLAN OF CARE: Admit for overnight observation  PATIENT DISPOSITION:  PACU - hemodynamically stable.   Delay start of Pharmacological VTE agent (>24hrs) due to surgical blood loss or risk of bleeding: yes

## 2011-08-24 ENCOUNTER — Encounter: Payer: Self-pay | Admitting: Urology

## 2011-08-24 LAB — TYPE AND SCREEN: Unit division: 0

## 2011-08-24 LAB — BASIC METABOLIC PANEL
BUN: 7 mg/dL (ref 6–23)
Creatinine, Ser: 0.75 mg/dL (ref 0.50–1.35)
GFR calc Af Amer: >90 mL/min (ref >90)
GFR calc non Af Amer: 84 mL/min — ABNORMAL LOW (ref >90)

## 2011-08-24 LAB — CBC
HCT: 32.7 % — ABNORMAL LOW (ref 39.0–52.0)
MCH: 32.6 pg (ref 26.0–34.0)
MCHC: 33.3 g/dL (ref 30.0–36.0)
MCV: 97.9 fL (ref 78.0–100.0)
RDW: 14.3 % (ref 11.5–15.5)

## 2011-08-24 MED ORDER — TOLTERODINE TARTRATE ER 2 MG PO CP24
4.0000 mg | ORAL_CAPSULE | Freq: Once | ORAL | Status: AC
  Administered 2011-08-24: 4 mg via ORAL
  Filled 2011-08-24: qty 2

## 2011-08-24 NOTE — Progress Notes (Unsigned)
Patient ID: [name], male   DOB: [birth year], 78 y.o.   MRN: [id number] [phone]

## 2011-08-24 NOTE — Progress Notes (Signed)
CBI stopped per MD order. Foley balloon deflated to 10cc per Dr. Jerre Simon. Patient resting now.

## 2011-08-24 NOTE — Discharge Instructions (Signed)
Call if you see any blood clots

## 2011-08-24 NOTE — Plan of Care (Signed)
Problem: Phase II Progression Outcomes Goal: Foley discontinued Outcome: Not Met (add Reason) Pt d/c with foley

## 2011-08-24 NOTE — Op Note (Signed)
NAMEGARLEN, REINIG                  ACCOUNT NO.:  [id number]  MEDICAL RECORD NO.:  [id number]  LOCATION:                                 FACILITY:  PHYSICIAN:  Ky Barban, M.D.DATE OF BIRTH:  01-08-1929  DATE OF PROCEDURE: DATE OF DISCHARGE:                              OPERATIVE REPORT   PREOPERATIVE DIAGNOSIS:  Benign prostatic hypertrophy.  POSTOPERATIVE DIAGNOSIS:  Benign prostatic hypertrophy.  PROCEDURE:  Transurethral resection of prostate.  ANESTHESIA:  Spinal.  PROCEDURE:  The patient under spinal anesthesia in lithotomy position, usual prep and drape, #28 Iglesias resectoscope was introduced into the bladder.  It was inspected.  No tumor, stone, foreign body, or inflammation in the bladder.  The resectoscope was pulled back in mid prostatic urethra.  There was a well-developed median lobe, significant lateral lobe hypertrophy.  The dissection on the median lobe was done to the level of the mid prostatic urethra.  Then, the resectoscope was pulled back at the level of the verumontanum, rotated to 11 o'clock position.  Resection of the right lobe was done completely between 11 and 7 o'clock position.  Similarly, the left lobe was resected between 1 and 5 o'clock position.  Next, the posterior midline tissue along with the apical tissue was resected very carefully not to injure the sphincter or the verumontanum.  Most of the adenoma was out.  The prostatic urethra was open.  There was a small amount of tissue in the anterior midline.  It was resected next.  Prostatic urethra was wide open.  Chips were evacuated.  Bleeders were coagulated.  At the end, the resectoscope was removed.  A #22 three-way Foley catheter left in for drainage.  CBI started which was clear.  The patient left the operating room in satisfactory condition.     Ky Barban, M.D.     MIJ/MEDQ  D:  08/23/2011  T:  08/24/2011  Job:  [id number]

## 2011-08-24 NOTE — Progress Notes (Signed)
Patient left with son via wheelchair. No questions at this time. Patient in stable condition. Follow up visit scheduled and family aware. Foley in place.

## 2011-08-25 NOTE — Progress Notes (Signed)
NAMEYARDEN, HILLIS                  ACCOUNT NO.:  [id number]  MEDICAL RECORD NO.:  [id number]  LOCATION:  A308                          FACILITY:  [facility]  PHYSICIAN:  Ky Barban, M.D.DATE OF BIRTH:  [birth year]  DATE OF PROCEDURE: DATE OF DISCHARGE:  08/24/2011                                PROGRESS NOTE   Mr. Furney is doing well.  He looks like he is having some bladder spasms; however, reduced the size of the balloon and the CBI is absolutely clear.  So, plan is to discharge him home with a Foley catheter.  I just spoke to his family and told them what to do.  He will be going home with Foley catheter and I will see him on Monday in the office, and we are going to stop his CBI and discharge him.  He can continue his usual medicines.  I have told him that if he has bleeding like he is passing blood clots, let me know.  Otherwise, I will see him back on Monday.     Ky Barban, M.D.     MIJ/MEDQ  D:  08/24/2011  T:  08/25/2011  Job:  [id number]

## 2011-08-28 ENCOUNTER — Emergency Department (HOSPITAL_COMMUNITY)
Admission: EM | Admit: 2011-08-28 | Discharge: 2011-08-28 | Disposition: A | Discharge status: Home / Self Care | Payer: Medicare Other | Attending: Emergency Medicine | Admitting: Emergency Medicine

## 2011-08-28 ENCOUNTER — Encounter (HOSPITAL_COMMUNITY): Payer: Self-pay | Admitting: *Deleted

## 2011-08-28 DIAGNOSIS — R339 Retention of urine, unspecified: Secondary | ICD-10-CM | POA: Insufficient documentation

## 2011-08-28 DIAGNOSIS — Z8673 Personal history of transient ischemic attack (TIA), and cerebral infarction without residual deficits: Secondary | ICD-10-CM | POA: Insufficient documentation

## 2011-08-28 DIAGNOSIS — Z79899 Other long term (current) drug therapy: Secondary | ICD-10-CM | POA: Insufficient documentation

## 2011-08-28 DIAGNOSIS — Y846 Urinary catheterization as the cause of abnormal reaction of the patient, or of later complication, without mention of misadventure at the time of the procedure: Secondary | ICD-10-CM | POA: Insufficient documentation

## 2011-08-28 DIAGNOSIS — F17200 Nicotine dependence, unspecified, uncomplicated: Secondary | ICD-10-CM | POA: Insufficient documentation

## 2011-08-28 DIAGNOSIS — R10819 Abdominal tenderness, unspecified site: Secondary | ICD-10-CM | POA: Insufficient documentation

## 2011-08-28 DIAGNOSIS — F341 Dysthymic disorder: Secondary | ICD-10-CM | POA: Insufficient documentation

## 2011-08-28 DIAGNOSIS — J4489 Other specified chronic obstructive pulmonary disease: Secondary | ICD-10-CM | POA: Insufficient documentation

## 2011-08-28 DIAGNOSIS — Z951 Presence of aortocoronary bypass graft: Secondary | ICD-10-CM | POA: Insufficient documentation

## 2011-08-28 DIAGNOSIS — T83091A Other mechanical complication of indwelling urethral catheter, initial encounter: Secondary | ICD-10-CM | POA: Insufficient documentation

## 2011-08-28 DIAGNOSIS — I10 Essential (primary) hypertension: Secondary | ICD-10-CM | POA: Insufficient documentation

## 2011-08-28 DIAGNOSIS — K219 Gastro-esophageal reflux disease without esophagitis: Secondary | ICD-10-CM | POA: Insufficient documentation

## 2011-08-28 DIAGNOSIS — J449 Chronic obstructive pulmonary disease, unspecified: Secondary | ICD-10-CM | POA: Insufficient documentation

## 2011-08-28 DIAGNOSIS — R319 Hematuria, unspecified: Secondary | ICD-10-CM | POA: Insufficient documentation

## 2011-08-28 DIAGNOSIS — T839XXA Unspecified complication of genitourinary prosthetic device, implant and graft, initial encounter: Secondary | ICD-10-CM

## 2011-08-28 MED ORDER — OXYCODONE-ACETAMINOPHEN 5-325 MG PO TABS
1.0000 | ORAL_TABLET | Freq: Once | ORAL | Status: AC
  Administered 2011-08-28: 1 via ORAL
  Filled 2011-08-28: qty 1

## 2011-08-28 NOTE — ED Notes (Signed)
Pink tinged urine drained from foley

## 2011-08-28 NOTE — Discharge Instructions (Signed)
RETURN FOR WORSENED PAIN, IF THERE IS NO URINE FLOW INTO THE CATHETER BAG, IF HE HAS SEVERE ABDOMINAL PAIN, OR ANY FEVER ABOVE 100.84F OR PERSISTENT VOMITING OVER THE NEXT 24 HOURS

## 2011-08-28 NOTE — ED Provider Notes (Signed)
History     CSN: [id number]  Arrival date & time 08/28/11  0026   First MD Initiated Contact with Patient 08/28/11 [phone]      Chief Complaint  Patient presents with  . Urinary Retention     Patient is a 78 y.o. male presenting with abdominal pain. The history is provided by the patient and a relative. The history is limited by the condition of the patient.  Abdominal Pain The primary symptoms of the illness include abdominal pain. The primary symptoms of the illness do not include fever or vomiting. Episode onset: just prior to arrival. The onset of the illness was gradual. The problem has not changed since onset. Associated with: foley catheter. Additional symptoms associated with the illness include hematuria. Symptoms associated with the illness do not include back pain.  Pt is s/p TURP last week, and has had foley catheter in place since Family reports that he woke them up reporting pain and was very anxious.   It was also noted that he had blood in his foley bag.   Family reports similar episode the day after the TURP, he went to his urologist office and had irrigation performed and he felt improved.  Past Medical History  Diagnosis Date  . Hypertension   . COPD (chronic obstructive pulmonary disease)   . Stroke     no deficits  . Glaucoma   . BPH (benign prostatic hyperplasia)   . Anxiety   . Depression   . Blindness - both eyes   . GERD (gastroesophageal reflux disease)   . Headache     Past Surgical History  Procedure Date  . Cardiac surgery   . Eye surgery     stick removed from eye as child;deadened nerves in right eye age 66  . Coronary artery bypass graft 2004  . Cataract extraction     left eye 2009  . Eye surgery     corneal ruptured and removed eye completely  . Cholecystectomy 20+ yrs ago  . Carotid endarterectomy     right  . Transurethral resection of prostate     Family History  Problem Relation Age of Onset  . Anesthesia problems Neg Hx   .  Hypotension Neg Hx   . Malignant hyperthermia Neg Hx   . Pseudochol deficiency Neg Hx     History  Substance Use Topics  . Smoking status: Current Everyday Smoker -- 1.5 packs/day for 50 years    Types: Cigarettes  . Smokeless tobacco: Not on file  . Alcohol Use: No     Limited as patient is poor historian at baseline Review of Systems  Unable to perform ROS: Other  Constitutional: Negative for fever.  Gastrointestinal: Positive for abdominal pain. Negative for vomiting.  Genitourinary: Positive for hematuria.  Musculoskeletal: Negative for back pain.    Allergies  Codeine and Penicillins  Home Medications   Current Outpatient Rx  Name Route Sig Dispense Refill  . BIMATOPROST 0.01 % OP SOLN Both Eyes Place 1 drop into both eyes daily.      Marland Kitchen BRIMONIDINE TARTRATE-TIMOLOL 0.2-0.5 % OP SOLN Both Eyes Place 1 drop into both eyes every 12 (twelve) hours.      . DOCUSATE SODIUM 100 MG PO CAPS Oral Take 100 mg by mouth daily.     . ERYTHROMYCIN 5 MG/GM OP OINT Left Eye Place 1 application into the left eye 2 (two) times daily.    Marland Kitchen FERROUS SULFATE 325 (65 FE) MG PO TABS Oral  Take 325 mg by mouth daily.      Marland Kitchen FOLIC ACID 1 MG PO TABS Oral Take 1 tablet (1 mg total) by mouth daily. 30 tablet 5  . LISINOPRIL 40 MG PO TABS Oral Take 40 mg by mouth daily.    Marland Kitchen LORAZEPAM 1 MG PO TABS Oral Take 1 mg by mouth daily as needed. Nerves    . METOPROLOL TARTRATE 50 MG PO TABS Oral Take 50 mg by mouth daily.      Marland Kitchen ONDANSETRON 8 MG PO TBDP Oral Take 8 mg by mouth every 8 (eight) hours as needed. Nausea and Vomiting    . POLYETHYLENE GLYCOL 3350 PO PACK Oral Take 17 g by mouth daily as needed. Constipation    . PROMETHAZINE HCL 25 MG PO TABS Oral Take 25 mg by mouth every 6 (six) hours as needed. Nausea and Vomitting    . SENNA 8.6 MG PO TABS Oral Take 1 tablet by mouth daily as needed. Constipation    . SIMVASTATIN 80 MG PO TABS Oral Take 40 mg by mouth daily. For cholesterol      BP 153/97   Pulse 80  Temp(Src) 98.9 F (37.2 C) (Oral)  Resp 20  Ht 5\' 8"  (1.727 m)  Wt 130 lb (58.968 kg)  BMI 19.77 kg/m2  SpO2 100%  Physical Exam CONSTITUTIONAL: Well developed/well nourished HEAD AND FACE: Normocephalic/atraumatic ENMT: Mucous membranes moist NECK: supple no meningeal signs SPINE:entire spine nontender CV: S1/S2 noted, no murmurs/rubs/gallops noted LUNGS: Lungs are clear to auscultation bilaterally, no apparent distress ABDOMEN: soft,mild diffuse tenderness noted.  No rebound/guarding is noted GU:no cva tenderness.  Foley catheter in place.  No trauma noted to penis.  The catheter bag has blood tinged urine present.  Chaperone present during exam NEURO: Pt is awake/alert, moves all extremitiesx4 EXTREMITIES:  full ROM SKIN: warm, color normal PSYCH: anxious  ED Course  Procedures  1:02 AM Pt is s/p TURP with bladder pain and hematuria.  He is reportedly better after irrigation of the catheter He is scheduled to see his urologist tomorrow  1:25 AM Pt feels improved, reporting less bladder/genital pain He is taking PO His urine has cleared    MDM  Nursing notes reviewed and considered in documentation Previous records reviewed and considered          Joya Gaskins, MD 08/28/11 [phone]

## 2011-08-28 NOTE — ED Notes (Signed)
Pt reporting pain in bladder.  Believes catheter may need to be irrigated. Pt did have a TURP on 2/12.  Also c/o headache.

## 2011-08-28 NOTE — ED Notes (Signed)
Irrigated foley catheter tubing with approximately 90ml sterile water. Patient tolerated well. Got return of approximately same amount, no blood clots or visible blood in tubing with irrigation. Patient states he feels some relief after irrigation.

## 2011-08-30 ENCOUNTER — Encounter (HOSPITAL_COMMUNITY): Payer: Self-pay | Admitting: Urology

## 2011-09-20 NOTE — Discharge Summary (Signed)
[phone]

## 2011-09-21 NOTE — Discharge Summary (Signed)
NAMEDRAYDEN, LUKAS NO.:  [id number]  MEDICAL RECORD NO.:  [id number]  LOCATION:  APA19                         FACILITY:  [facility]  PHYSICIAN:  Ky Barban, M.D.DATE OF BIRTH:  07/07/1929  DATE OF ADMISSION:  08/28/2011 DATE OF DISCHARGE:  02/18/2013LH                              DISCHARGE SUMMARY   A 78 year old gentleman who was referred to me by Dr. Renard Matter has a longstanding history of prostatism, has BPH, has been taking Flomax.  It is not working any more, so I advised him to undergo TUR prostate for which he underwent routine preadmission workup, CBC, BMET which is a normal.  EKG, chest x-ray also normal.  He was taking Plavix which has been discontinued preoperatively along with a screen also.  He underwent TUR prostate.  Next day it looks like he was having some bladder spasms, so I reduced the size of the balloon.  His urine was clear, and I decided to send him home with Foley catheter.  So he was discharged home.  I will take his catheter out in 2 days in the office.  Final pathology report is pending and as soon as I get it I will check it.  FINAL DISCHARGE DIAGNOSES:  Benign prostatic hypertrophy with bladder neck obstruction.  DISCHARGE MEDICATION:  He is advised to continue his usual medicines at home except Plavix and aspirin and I will see him back in 2 weeks in the office.     Ky Barban, M.D.     MIJ/MEDQ  D:  09/20/2011  T:  09/21/2011  Job:  [id number]

## 2011-12-11 ENCOUNTER — Encounter: Payer: Self-pay | Admitting: Vascular Surgery

## 2011-12-12 ENCOUNTER — Ambulatory Visit (INDEPENDENT_AMBULATORY_CARE_PROVIDER_SITE_OTHER): Payer: Medicare Other | Admitting: Vascular Surgery

## 2011-12-12 ENCOUNTER — Encounter: Payer: Self-pay | Admitting: Vascular Surgery

## 2011-12-12 ENCOUNTER — Ambulatory Visit (INDEPENDENT_AMBULATORY_CARE_PROVIDER_SITE_OTHER): Payer: Medicare Other | Admitting: *Deleted

## 2011-12-12 VITALS — BP 192/75 | HR 88 | Resp 18 | Ht 69.0 in | Wt 135.0 lb

## 2011-12-12 DIAGNOSIS — I714 Abdominal aortic aneurysm, without rupture, unspecified: Secondary | ICD-10-CM | POA: Insufficient documentation

## 2011-12-12 NOTE — Progress Notes (Signed)
Subjective:     Patient ID: [name], male   DOB: 02-Mar-1929, 78 y.o.   MRN: [id number]  HPI this 78 year old frail male patient returns for further followup regarding his large bowel aortic aneurysm. I last saw him 78 years ago and it was 5 point or centimeters in maximum diameter. He was too frail to consider surgery. He has no interest in having this aneurysm treated. In the meantime he has total blindness of both eyes. He has had a history of coronary artery disease with coronary artery bypass grafting in the 1990s. He had a CT scan performed in January of 2013 with no contrast which I have reviewed today. There is a lot of calcification in his iliac arteries. He is a borderline candidate for a stent graft because of access.  Past Medical History  Diagnosis Date  . Hypertension   . COPD (chronic obstructive pulmonary disease)   . Stroke     no deficits  . Glaucoma   . BPH (benign prostatic hyperplasia)   . Anxiety   . Depression   . Blindness - both eyes   . GERD (gastroesophageal reflux disease)   . Headache     History  Substance Use Topics  . Smoking status: Current Everyday Smoker -- 1.5 packs/day for 50 years    Types: Cigarettes  . Smokeless tobacco: Never Used  . Alcohol Use: No    Family History  Problem Relation Age of Onset  . Anesthesia problems Neg Hx   . Hypotension Neg Hx   . Malignant hyperthermia Neg Hx   . Pseudochol deficiency Neg Hx     Allergies  Allergen Reactions  . Codeine Hives  . Penicillins Hives    Current outpatient prescriptions:bimatoprost (LUMIGAN) 0.01 % SOLN, Place 1 drop into both eyes daily.  , Disp: , Rfl: ;  brimonidine-timolol (COMBIGAN) 0.2-0.5 % ophthalmic solution, Place 1 drop into both eyes every 12 (twelve) hours.  , Disp: , Rfl: ;  docusate sodium (COLACE) 100 MG capsule, Take 100 mg by mouth daily. , Disp: , Rfl: ;  erythromycin ophthalmic ointment, Place 1 application into the left eye 2 (two) times daily., Disp: , Rfl:    ferrous sulfate 325 (65 FE) MG tablet, Take 325 mg by mouth daily.  , Disp: , Rfl: ;  folic acid (FOLVITE) 1 MG tablet, Take 1 tablet (1 mg total) by mouth daily., Disp: 30 tablet, Rfl: 5;  lisinopril (PRINIVIL,ZESTRIL) 40 MG tablet, Take 40 mg by mouth daily., Disp: , Rfl: ;  LORazepam (ATIVAN) 1 MG tablet, Take 1 mg by mouth daily as needed. Nerves, Disp: , Rfl: ;  metoprolol (LOPRESSOR) 50 MG tablet, Take 50 mg by mouth daily.  , Disp: , Rfl:  ondansetron (ZOFRAN-ODT) 8 MG disintegrating tablet, Take 8 mg by mouth every 8 (eight) hours as needed. Nausea and Vomiting, Disp: , Rfl: ;  polyethylene glycol (MIRALAX / GLYCOLAX) packet, Take 17 g by mouth daily as needed. Constipation, Disp: , Rfl: ;  promethazine (PHENERGAN) 25 MG tablet, Take 25 mg by mouth every 6 (six) hours as needed. Nausea and Vomitting, Disp: , Rfl:  senna (SENOKOT) 8.6 MG TABS, Take 1 tablet by mouth daily as needed. Constipation, Disp: , Rfl: ;  simvastatin (ZOCOR) 80 MG tablet, Take 40 mg by mouth daily. For cholesterol, Disp: , Rfl:   BP 192/75  Pulse 88  Resp 18  Ht 5\' 9"  (1.753 m)  Wt 135 lb (61.236 kg)  BMI 19.94 kg/m2  Body mass index is 19.94 kg/(m^2).          Review of Systems denies chest pain, dyspnea on exertion, PND, orthopnea, hemoptysis. Does not ambulate much. Does have total blindness both eyes. Has reflux esophagitis.    Objective:   Physical Exam blood pressure 192/75 heart rate 88 respirations 18 General thin elderly frail male in no apparent distress alert and oriented x3 HEENT-total blindness both eyes Chest no rhonchi or wheezing Cardiovascular regular rhythm no murmurs carotid pulses 3+ no audible bruits Abdomen large pulsatile mass in the 5-6 cm range which is nontender 2+ femoral and 2+ posterior tibial pulses palpable bilaterally  Today I ordered a duplex scan of his abdominal aorta which are reviewed and interpreted. The aneurysm measures 5.59 in maximal diameter which is similar  to the CT scan performed 6 months ago    Assessment:     Large infrarenal abdominal aortic aneurysm-very borderline for stent graft repair because of access-stable in size Patient has no interest in repair of this aneurysm at this time    Plan:     Return in one year with CT angiogram to check on status of size as well as possibility for staying graft repair if patient should change his mind-poor operative candidate

## 2011-12-18 NOTE — Procedures (Unsigned)
DUPLEX ULTRASOUND OF ABDOMINAL AORTA  INDICATION:  AAA.  HISTORY: Diabetes:  No. Cardiac:  CABG. Hypertension:  Yes. Smoking:  Yes. Connective Tissue Disorder: Family History:  No. Previous Surgery:  Carotid surgery 2011.  DUPLEX EXAM:         AP (cm)                   TRANSVERSE (cm) Proximal             2.94 cm                   Not visualized Mid                  5.36 cm                   5.59 cm Distal               5.56 cm                   5.56 cm Right Iliac          Not visualized            Not visualized Left Iliac           Not visualized            Not visualized  PREVIOUS:  Date: 06/09/2011  AP:  5.3  TRANSVERSE:  5.4  IMPRESSION: 1. Aneurysmal dilatation of the mid to distal abdominal aorta showing     an increase in diameter for the third consecutive exam. 2. Unable to adequately visualize the bilateral common iliac arteries     due to overlying bowel gas.  ___________________________________________ Quita Skye. Hart Rochester, M.D.  EM/MEDQ  D:  12/12/2011  T:  12/12/2011  Job:  [id number]

## 2012-03-15 IMAGING — CR DG LUMBAR SPINE COMPLETE 4+V
5 series · 5 of 5 positions shown · non-contrast
Comparison: Abdominal CT 12/21/2009.

CLINICAL DATA: Low back pain status post fall 2 days ago.

LUMBAR SPINE - COMPLETE 4+ VIEW

[view not recorded (1 of 5)]
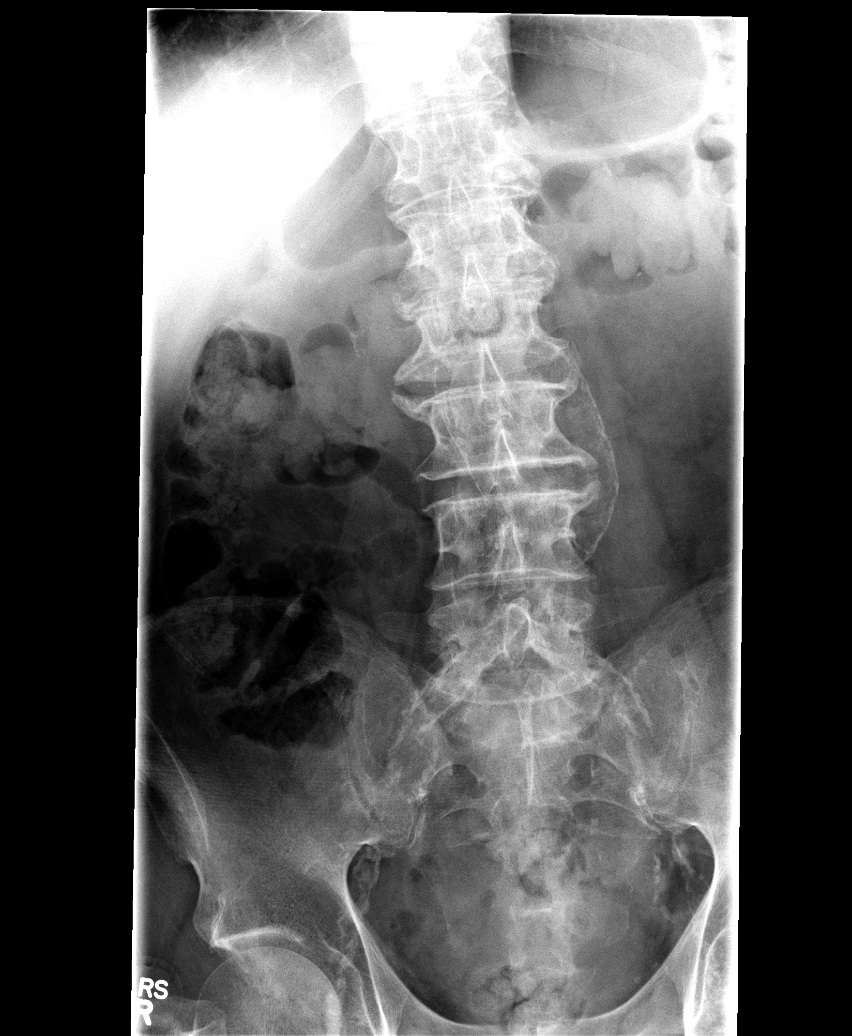

[view not recorded (2 of 5)]
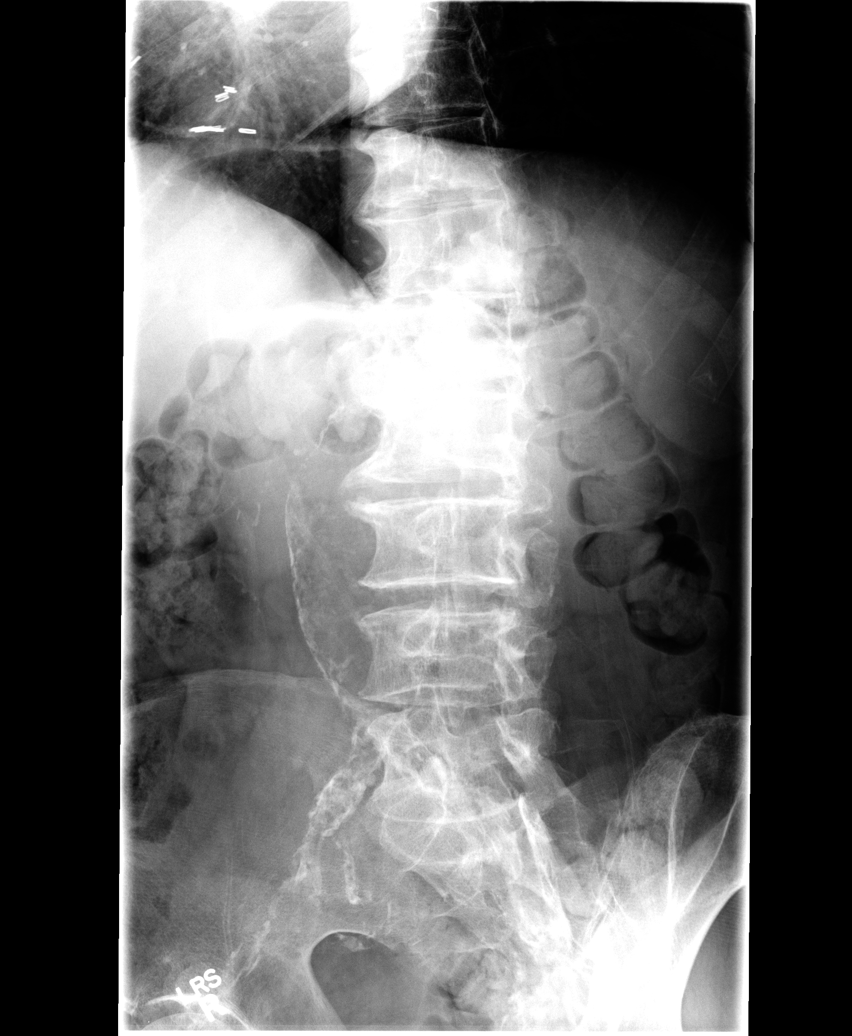

[view not recorded (3 of 5)]
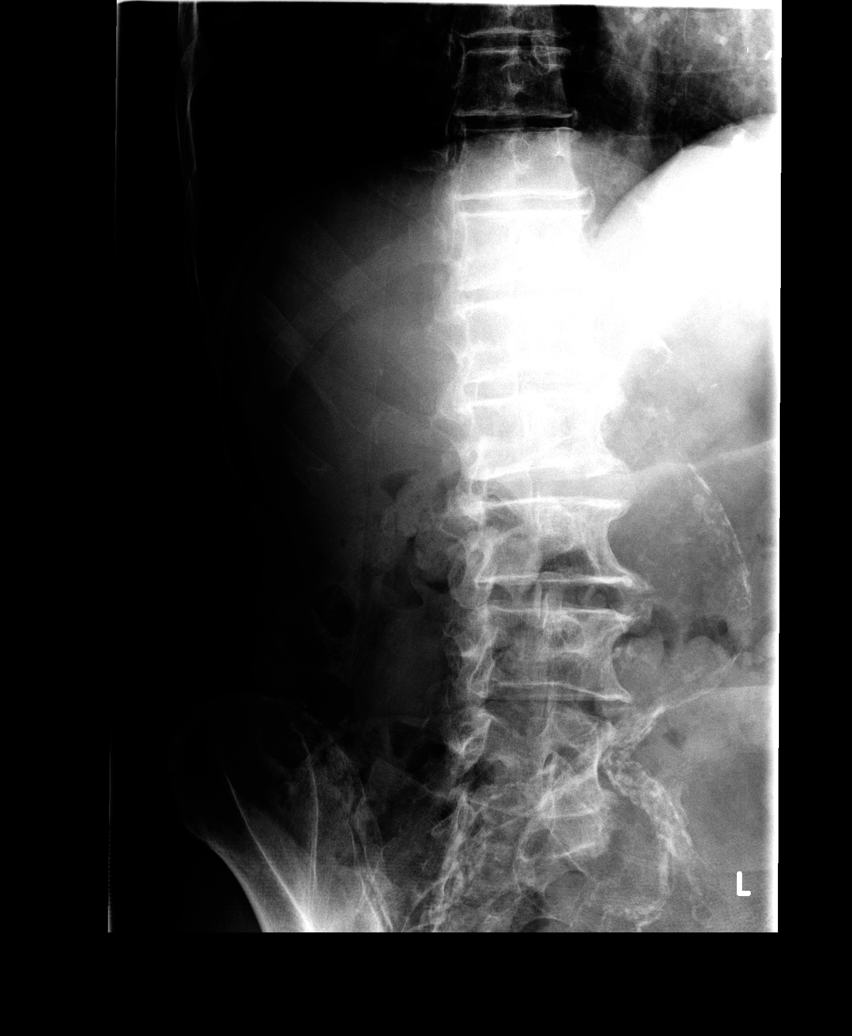

[view not recorded (4 of 5)]
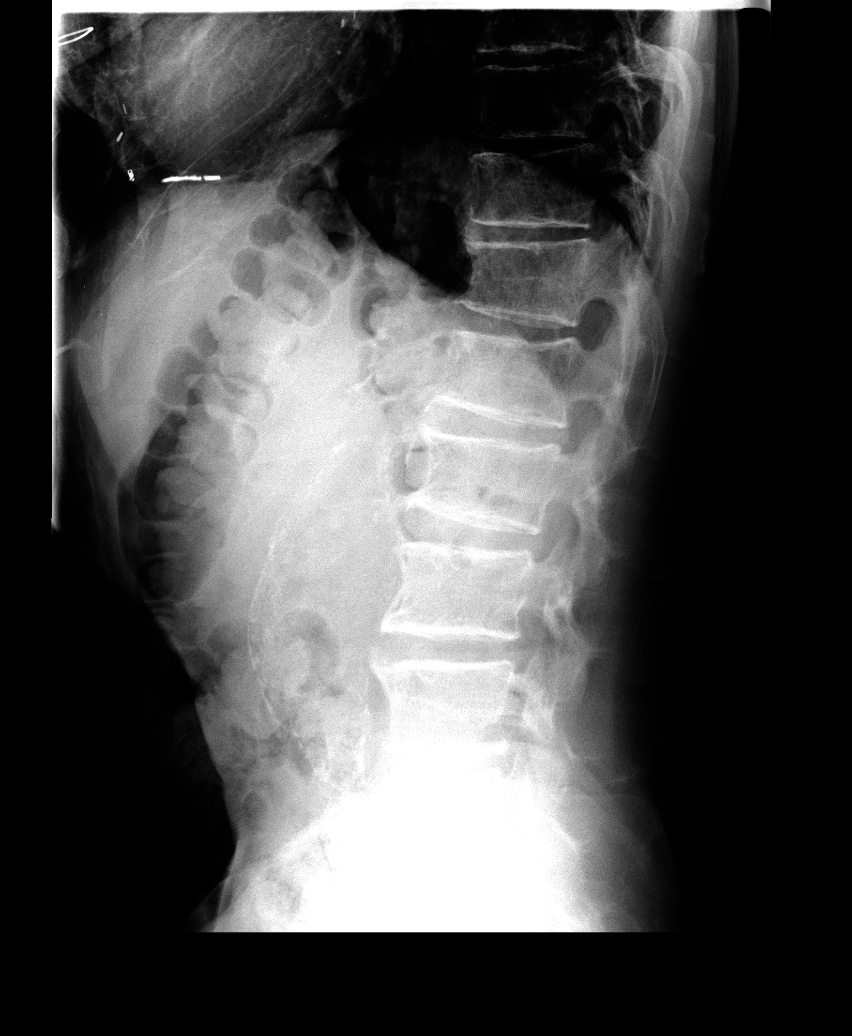

[view not recorded (5 of 5)]
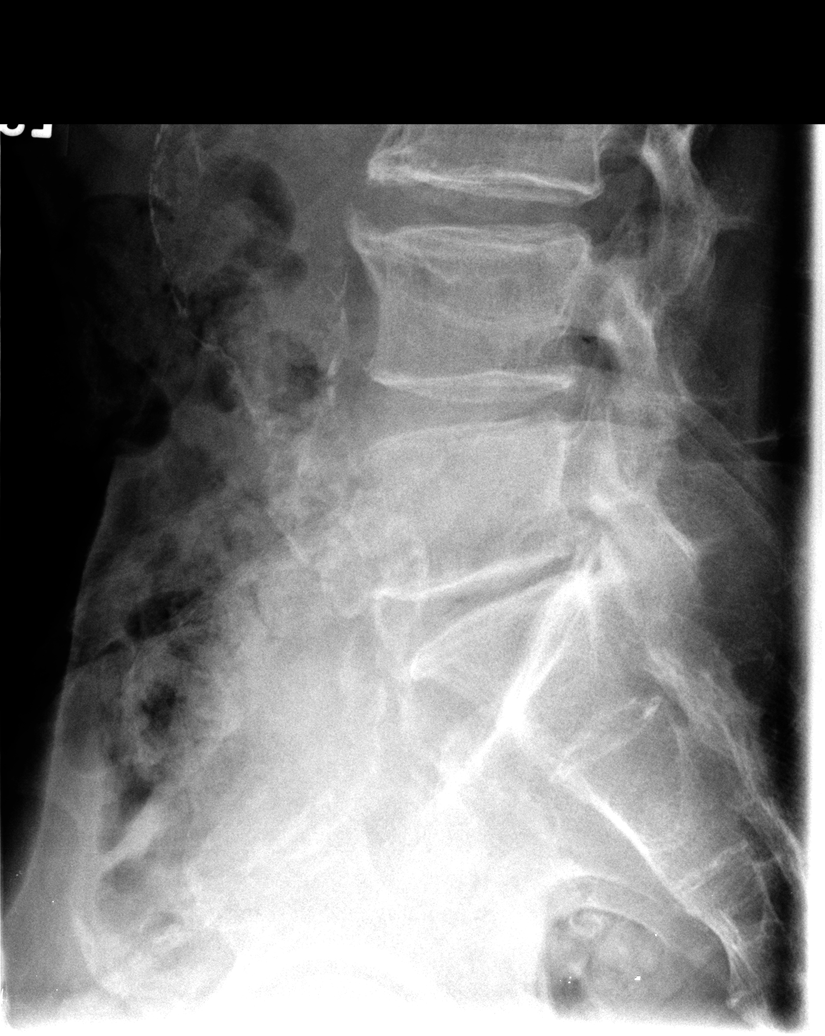

[5 of 5 positions shown; findings below may reference images not displayed]

FINDINGS: There are five lumbar type vertebral bodies.  The
alignment is stable.  There is stable mild anterior wedging of the
L1 vertebral body and stable multilevel spondylosis.  Facet
degenerative changes are noted inferiorly.  There is no evidence of
acute fracture or pars defect.  There is diffuse aorto iliac
atherosclerosis with a known distal abdominal aortic aneurysm.
IMPRESSION: No acute osseous findings.  Stable spondylosis and extensive
atherosclerosis.

## 2012-03-15 IMAGING — CR DG HIP COMPLETE 2+V*R*
3 series · 3 of 3 positions shown · non-contrast
Comparison: Pelvic CT 12/21/2009.

CLINICAL DATA: Low back and left hip pain status post fall 2 days
ago.

RIGHT HIP - COMPLETE 2+ VIEW

[view not recorded (1 of 3)]
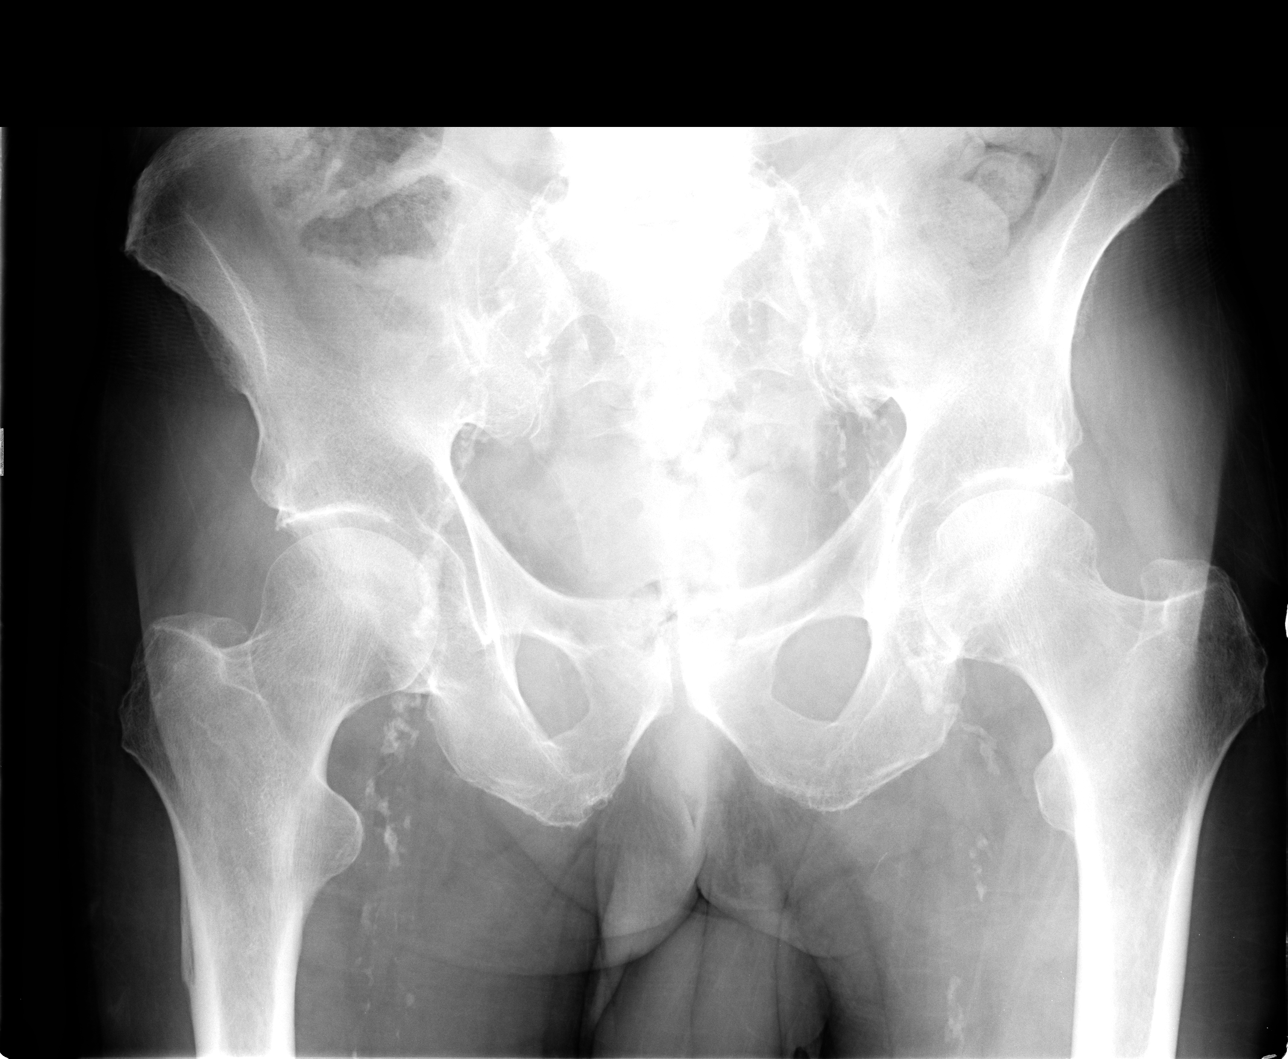

[view not recorded (2 of 3)]
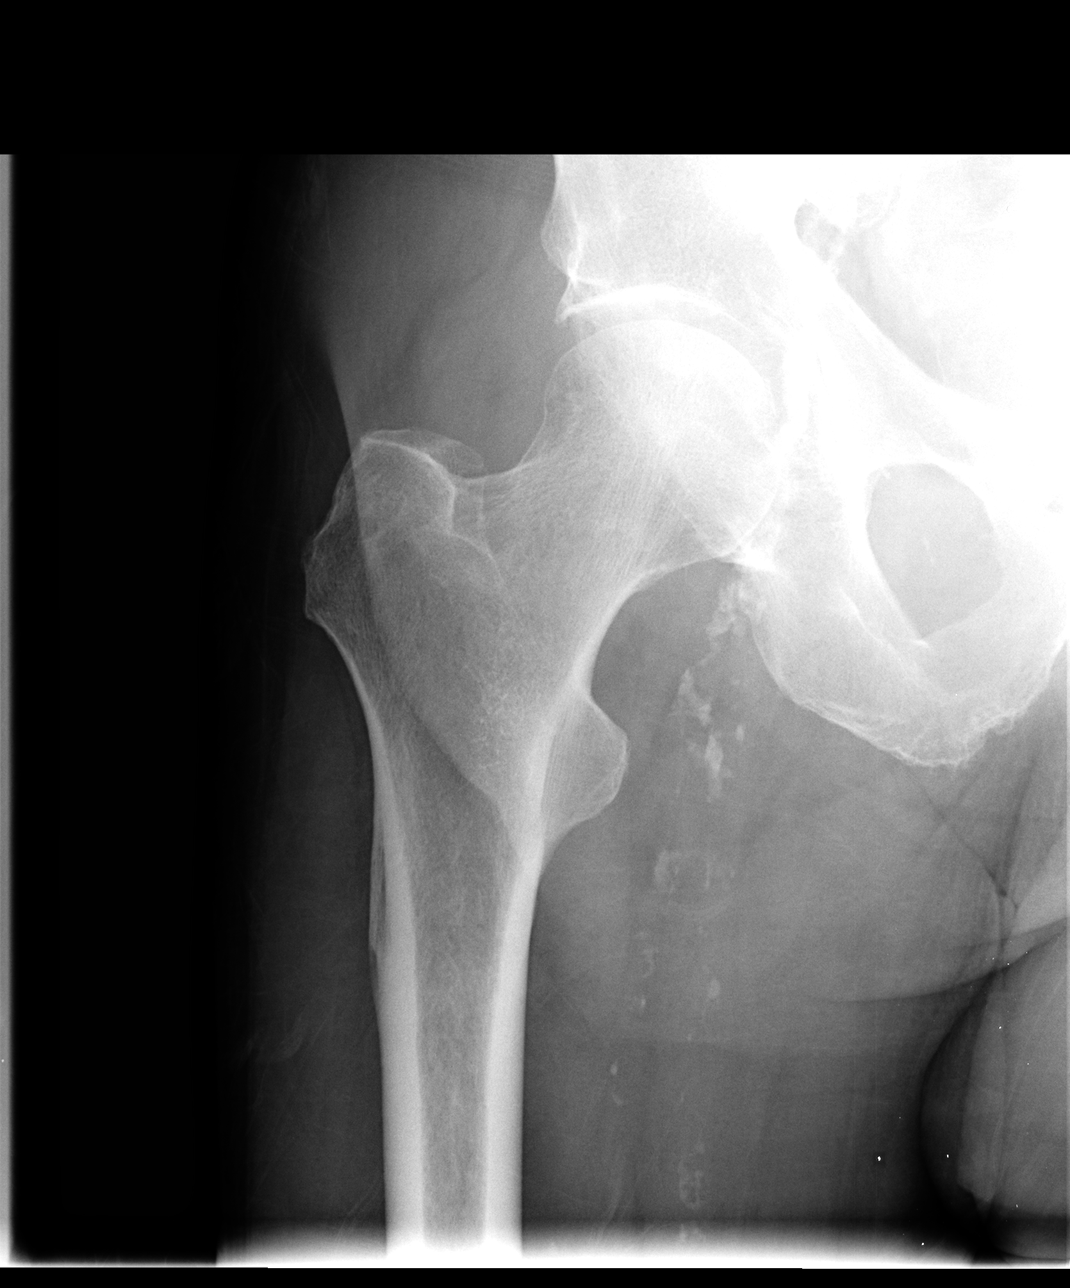

[view not recorded (3 of 3)]
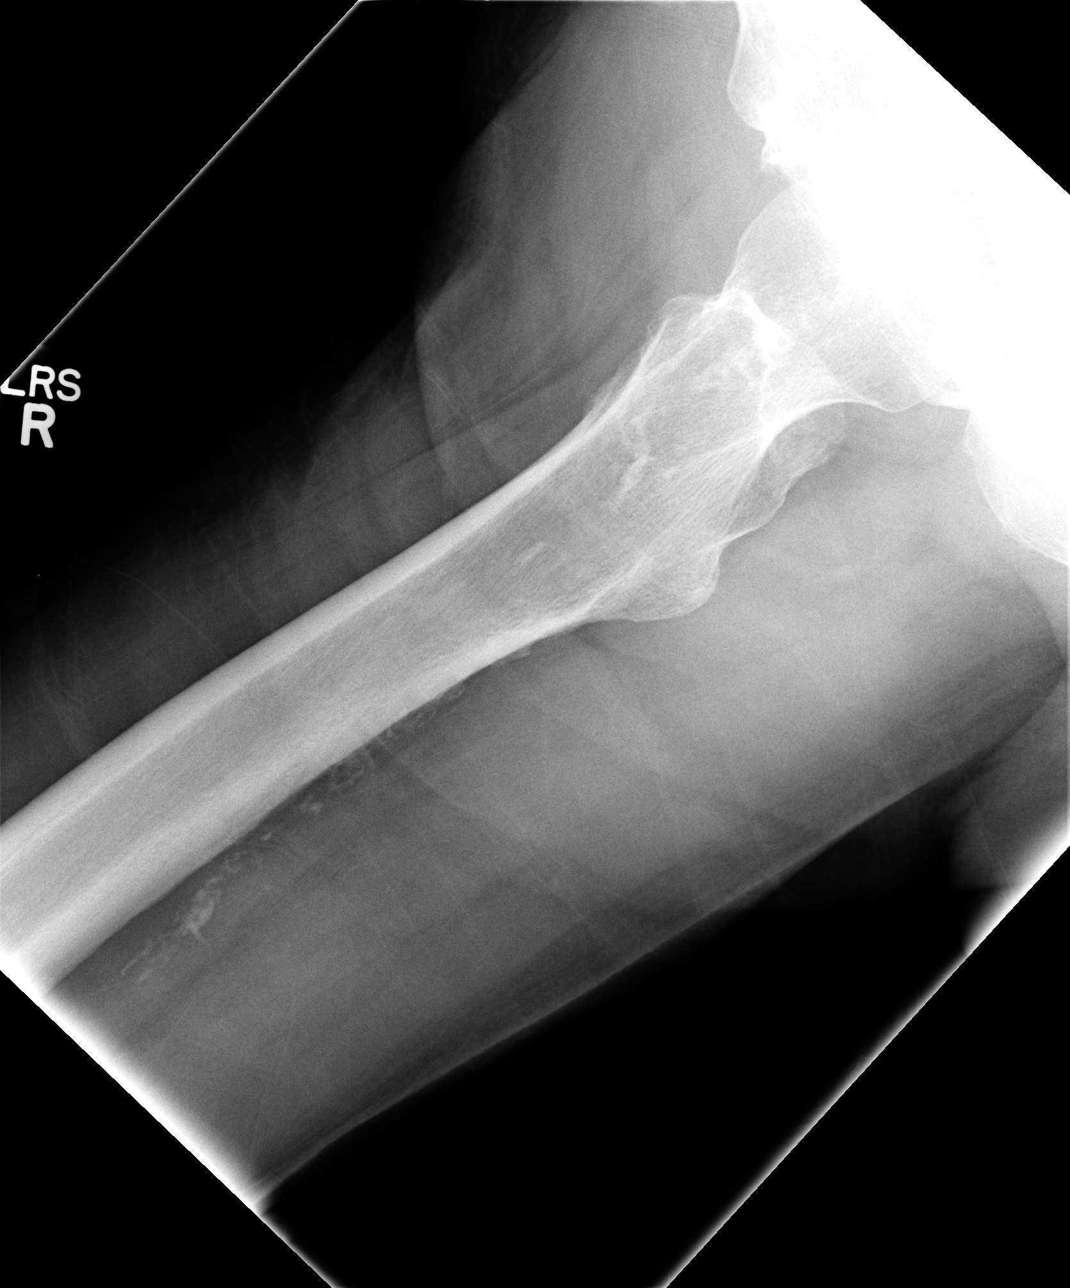

[3 of 3 positions shown; findings below may reference images not displayed]

FINDINGS: Mineralization and alignment are normal.  There is no
evidence of acute fracture or dislocation.  The hip joint spaces
are preserved.  Cortical thickening of the proximal right femur
laterally is unchanged from the scout image from a CT done
11/07/2004.  There are diffuse iliac and femoral arterial vascular
calcifications.
IMPRESSION: No acute osseous findings of the RIGHT hip.  Note history states
LEFT HIP PAIN.

## 2012-05-13 ENCOUNTER — Other Ambulatory Visit (HOSPITAL_COMMUNITY): Payer: Self-pay | Admitting: Family Medicine

## 2012-05-13 ENCOUNTER — Ambulatory Visit (HOSPITAL_COMMUNITY)
Admission: RE | Admit: 2012-05-13 | Discharge: 2012-05-13 | Disposition: A | Discharge status: Home / Self Care | Payer: Medicare Other | Source: Ambulatory Visit | Attending: Family Medicine | Admitting: Family Medicine

## 2012-05-13 DIAGNOSIS — I10 Essential (primary) hypertension: Secondary | ICD-10-CM | POA: Insufficient documentation

## 2012-05-13 DIAGNOSIS — J449 Chronic obstructive pulmonary disease, unspecified: Secondary | ICD-10-CM

## 2012-05-13 DIAGNOSIS — F17200 Nicotine dependence, unspecified, uncomplicated: Secondary | ICD-10-CM | POA: Insufficient documentation

## 2012-05-13 DIAGNOSIS — F17210 Nicotine dependence, cigarettes, uncomplicated: Secondary | ICD-10-CM

## 2012-05-13 DIAGNOSIS — J4489 Other specified chronic obstructive pulmonary disease: Secondary | ICD-10-CM | POA: Insufficient documentation

## 2012-06-17 ENCOUNTER — Encounter: Payer: Self-pay | Admitting: Neurosurgery

## 2012-06-18 ENCOUNTER — Ambulatory Visit: Payer: Medicare Other | Admitting: Neurosurgery

## 2012-06-18 ENCOUNTER — Other Ambulatory Visit: Payer: Medicare Other

## 2012-07-04 ENCOUNTER — Other Ambulatory Visit: Payer: Self-pay | Admitting: *Deleted

## 2012-07-04 DIAGNOSIS — I714 Abdominal aortic aneurysm, without rupture: Secondary | ICD-10-CM

## 2012-07-04 DIAGNOSIS — Z48812 Encounter for surgical aftercare following surgery on the circulatory system: Secondary | ICD-10-CM

## 2012-09-25 ENCOUNTER — Encounter (HOSPITAL_COMMUNITY): Payer: Self-pay

## 2012-09-25 ENCOUNTER — Emergency Department (HOSPITAL_COMMUNITY)
Admission: EM | Admit: 2012-09-25 | Discharge: 2012-09-25 | Disposition: A | Discharge status: Home / Self Care | Payer: Medicare Other | Attending: Emergency Medicine | Admitting: Emergency Medicine

## 2012-09-25 DIAGNOSIS — J4489 Other specified chronic obstructive pulmonary disease: Secondary | ICD-10-CM | POA: Insufficient documentation

## 2012-09-25 DIAGNOSIS — I10 Essential (primary) hypertension: Secondary | ICD-10-CM | POA: Insufficient documentation

## 2012-09-25 DIAGNOSIS — H40009 Preglaucoma, unspecified, unspecified eye: Secondary | ICD-10-CM | POA: Insufficient documentation

## 2012-09-25 DIAGNOSIS — H543 Unqualified visual loss, both eyes: Secondary | ICD-10-CM | POA: Insufficient documentation

## 2012-09-25 DIAGNOSIS — F411 Generalized anxiety disorder: Secondary | ICD-10-CM | POA: Insufficient documentation

## 2012-09-25 DIAGNOSIS — Z87448 Personal history of other diseases of urinary system: Secondary | ICD-10-CM | POA: Insufficient documentation

## 2012-09-25 DIAGNOSIS — Z8659 Personal history of other mental and behavioral disorders: Secondary | ICD-10-CM | POA: Insufficient documentation

## 2012-09-25 DIAGNOSIS — Z79899 Other long term (current) drug therapy: Secondary | ICD-10-CM | POA: Insufficient documentation

## 2012-09-25 DIAGNOSIS — H6120 Impacted cerumen, unspecified ear: Secondary | ICD-10-CM | POA: Insufficient documentation

## 2012-09-25 DIAGNOSIS — Z8719 Personal history of other diseases of the digestive system: Secondary | ICD-10-CM | POA: Insufficient documentation

## 2012-09-25 DIAGNOSIS — Z8673 Personal history of transient ischemic attack (TIA), and cerebral infarction without residual deficits: Secondary | ICD-10-CM | POA: Insufficient documentation

## 2012-09-25 DIAGNOSIS — F17200 Nicotine dependence, unspecified, uncomplicated: Secondary | ICD-10-CM | POA: Insufficient documentation

## 2012-09-25 NOTE — ED Provider Notes (Signed)
History     CSN: [id number]  Arrival date & time 09/25/12  1191   First MD Initiated Contact with Patient 09/25/12 [phone]      No chief complaint on file.   (Consider location/radiation/quality/duration/timing/severity/associated sxs/prior Treatment)  Patient is a 79 y.o. male presenting with plugged ear sensation.  Ear Fullness This is a new problem. The current episode started 1 to 4 weeks ago. The problem occurs constantly. The problem has been unchanged. Pertinent negatives include no chest pain, chills, fever or headaches. Associated symptoms comments: No other problems. Nothing aggravates the symptoms. He has tried nothing for the symptoms.   Patient complains of decreased hearing in left ear. Thinks is wax build up. Denies pain or other problems.   Past Medical History  Diagnosis Date  . Hypertension   . COPD (chronic obstructive pulmonary disease)   . Stroke     no deficits  . Glaucoma(365)   . BPH (benign prostatic hyperplasia)   . Anxiety   . Depression   . Blindness - both eyes   . GERD (gastroesophageal reflux disease)   . Headache     Past Surgical History  Procedure Laterality Date  . Cardiac surgery    . Eye surgery      stick removed from eye as child;deadened nerves in right eye age 41  . Coronary artery bypass graft  2004  . Cataract extraction      left eye 2009  . Eye surgery      corneal ruptured and removed eye completely  . Cholecystectomy  20+ yrs ago  . Carotid endarterectomy      right  . Transurethral resection of prostate    . Transurethral resection of prostate  08/23/2011    Procedure: TRANSURETHRAL RESECTION OF THE PROSTATE (TURP);  Surgeon: Ky Barban, MD;  Location: [facility];  Service: Urology;  Laterality: N/A;    Family History  Problem Relation Age of Onset  . Anesthesia problems Neg Hx   . Hypotension Neg Hx   . Malignant hyperthermia Neg Hx   . Pseudochol deficiency Neg Hx     History  Substance Use Topics  .  Smoking status: Current Every Day Smoker -- 1.50 packs/day for 50 years    Types: Cigarettes  . Smokeless tobacco: Never Used  . Alcohol Use: No      Review of Systems  Constitutional: Negative for fever and chills.  HENT: Ear pain: fullness left ear.   Eyes: Negative for pain.  Cardiovascular: Negative for chest pain.  Neurological: Negative for headaches.    Allergies  Codeine and Penicillins  Home Medications   Current Outpatient Rx  Name  Route  Sig  Dispense  Refill  . bimatoprost (LUMIGAN) 0.01 % SOLN   Both Eyes   Place 1 drop into both eyes daily.           . brimonidine-timolol (COMBIGAN) 0.2-0.5 % ophthalmic solution   Both Eyes   Place 1 drop into both eyes every 12 (twelve) hours.           . docusate sodium (COLACE) 100 MG capsule   Oral   Take 100 mg by mouth daily.          Marland Kitchen erythromycin ophthalmic ointment   Left Eye   Place 1 application into the left eye 2 (two) times daily.         . ferrous sulfate 325 (65 FE) MG tablet   Oral   Take 325 mg  by mouth daily.           Marland Kitchen EXPIRED: lisinopril (PRINIVIL,ZESTRIL) 40 MG tablet   Oral   Take 40 mg by mouth daily.         Marland Kitchen LORazepam (ATIVAN) 1 MG tablet   Oral   Take 1 mg by mouth daily as needed. Nerves         . metoprolol (LOPRESSOR) 50 MG tablet   Oral   Take 50 mg by mouth daily.           . ondansetron (ZOFRAN-ODT) 8 MG disintegrating tablet   Oral   Take 8 mg by mouth every 8 (eight) hours as needed. Nausea and Vomiting         . polyethylene glycol (MIRALAX / GLYCOLAX) packet   Oral   Take 17 g by mouth daily as needed. Constipation         . promethazine (PHENERGAN) 25 MG tablet   Oral   Take 25 mg by mouth every 6 (six) hours as needed. Nausea and Vomitting         . senna (SENOKOT) 8.6 MG TABS   Oral   Take 1 tablet by mouth daily as needed. Constipation         . simvastatin (ZOCOR) 80 MG tablet   Oral   Take 40 mg by mouth daily. For  cholesterol           BP 170/73  Pulse 82  Temp(Src) 97.9 F (36.6 C) (Oral)  Resp 15  SpO2 99%  Physical Exam  Nursing note and vitals reviewed. Constitutional: He is oriented to person, place, and time. He appears well-developed and well-nourished. No distress.  HENT:  Head: Normocephalic.  Cerumen impaction left ear  Neck: Neck supple.  Cardiovascular: Normal rate.   Pulmonary/Chest: Effort normal.  Musculoskeletal: Normal range of motion.  Neurological: He is alert and oriented to person, place, and time.  Skin: Skin is warm and dry.  Psychiatric: He has a normal mood and affect. His behavior is normal. Judgment and thought content normal.    ED Course  Procedures  Ear irrigation left ear with good results, large amount of cerumen removed and patient able to here now.   Assessment: 79 y.o. male with cerumen impaction left ear  Plan:  Ear irrigation successful   Patient has ear drops   Follow up with PCP   MDM  Patient has cortisporin ear drops given to him by Dr. Pernell Dupre Orlene Och, NP 09/25/12 1643

## 2012-09-25 NOTE — ED Notes (Signed)
Patient with no complaints at this time. Respirations even and unlabored. Skin warm/dry. Discharge instructions reviewed with patient at this time. Patient given opportunity to voice concerns/ask questions. Patient discharged at this time and left Emergency Department with steady gait.   

## 2012-09-25 NOTE — ED Notes (Signed)
Pt reports " i feel like my left ear is stopped up".  Pt reports seeing Dr Megan Mans on Monday and had it flushed w/out any relief.

## 2012-09-25 NOTE — ED Notes (Signed)
Orders per [facility] NP to flush pt left ear.

## 2012-09-28 NOTE — ED Provider Notes (Signed)
Medical screening examination/treatment/procedure(s) were performed by non-physician practitioner and as supervising physician I was immediately available for consultation/collaboration.   Joya Gaskins, MD 09/28/12 [phone]

## 2012-12-09 ENCOUNTER — Encounter: Payer: Self-pay | Admitting: Vascular Surgery

## 2012-12-10 ENCOUNTER — Other Ambulatory Visit: Payer: Medicare Other

## 2012-12-10 ENCOUNTER — Ambulatory Visit: Payer: Medicare Other | Admitting: Vascular Surgery

## 2014-07-23 ENCOUNTER — Encounter (HOSPITAL_COMMUNITY): Payer: Self-pay | Admitting: Urology

## 2015-03-14 ENCOUNTER — Inpatient Hospital Stay (HOSPITAL_COMMUNITY)
Admission: EM | Admit: 2015-03-14 | Discharge: 2015-03-24 | DRG: 480 | Disposition: A | Discharge status: Skilled Nursing Facility | Payer: Medicare Other | Attending: Family Medicine | Admitting: Family Medicine

## 2015-03-14 ENCOUNTER — Emergency Department (HOSPITAL_COMMUNITY): Payer: Medicare Other

## 2015-03-14 ENCOUNTER — Encounter (HOSPITAL_COMMUNITY): Payer: Self-pay | Admitting: *Deleted

## 2015-03-14 ENCOUNTER — Inpatient Hospital Stay (HOSPITAL_COMMUNITY): Payer: Medicare Other

## 2015-03-14 DIAGNOSIS — M25551 Pain in right hip: Secondary | ICD-10-CM | POA: Diagnosis present

## 2015-03-14 DIAGNOSIS — F329 Major depressive disorder, single episode, unspecified: Secondary | ICD-10-CM | POA: Diagnosis present

## 2015-03-14 DIAGNOSIS — W19XXXA Unspecified fall, initial encounter: Secondary | ICD-10-CM

## 2015-03-14 DIAGNOSIS — S72141A Displaced intertrochanteric fracture of right femur, initial encounter for closed fracture: Secondary | ICD-10-CM | POA: Diagnosis present

## 2015-03-14 DIAGNOSIS — H409 Unspecified glaucoma: Secondary | ICD-10-CM | POA: Diagnosis present

## 2015-03-14 DIAGNOSIS — N400 Enlarged prostate without lower urinary tract symptoms: Secondary | ICD-10-CM | POA: Diagnosis present

## 2015-03-14 DIAGNOSIS — Z951 Presence of aortocoronary bypass graft: Secondary | ICD-10-CM | POA: Diagnosis not present

## 2015-03-14 DIAGNOSIS — J441 Chronic obstructive pulmonary disease with (acute) exacerbation: Secondary | ICD-10-CM

## 2015-03-14 DIAGNOSIS — I4891 Unspecified atrial fibrillation: Secondary | ICD-10-CM | POA: Diagnosis not present

## 2015-03-14 DIAGNOSIS — W010XXA Fall on same level from slipping, tripping and stumbling without subsequent striking against object, initial encounter: Secondary | ICD-10-CM | POA: Diagnosis present

## 2015-03-14 DIAGNOSIS — D500 Iron deficiency anemia secondary to blood loss (chronic): Secondary | ICD-10-CM | POA: Diagnosis not present

## 2015-03-14 DIAGNOSIS — J180 Bronchopneumonia, unspecified organism: Secondary | ICD-10-CM | POA: Diagnosis not present

## 2015-03-14 DIAGNOSIS — S72001A Fracture of unspecified part of neck of right femur, initial encounter for closed fracture: Secondary | ICD-10-CM | POA: Diagnosis not present

## 2015-03-14 DIAGNOSIS — E785 Hyperlipidemia, unspecified: Secondary | ICD-10-CM | POA: Diagnosis present

## 2015-03-14 DIAGNOSIS — R197 Diarrhea, unspecified: Secondary | ICD-10-CM | POA: Diagnosis not present

## 2015-03-14 DIAGNOSIS — I2510 Atherosclerotic heart disease of native coronary artery without angina pectoris: Secondary | ICD-10-CM | POA: Diagnosis present

## 2015-03-14 DIAGNOSIS — J449 Chronic obstructive pulmonary disease, unspecified: Secondary | ICD-10-CM | POA: Diagnosis present

## 2015-03-14 DIAGNOSIS — I480 Paroxysmal atrial fibrillation: Secondary | ICD-10-CM | POA: Diagnosis not present

## 2015-03-14 DIAGNOSIS — F17210 Nicotine dependence, cigarettes, uncomplicated: Secondary | ICD-10-CM | POA: Diagnosis present

## 2015-03-14 DIAGNOSIS — K219 Gastro-esophageal reflux disease without esophagitis: Secondary | ICD-10-CM | POA: Diagnosis present

## 2015-03-14 DIAGNOSIS — Z8673 Personal history of transient ischemic attack (TIA), and cerebral infarction without residual deficits: Secondary | ICD-10-CM

## 2015-03-14 DIAGNOSIS — H540 Blindness, both eyes: Secondary | ICD-10-CM | POA: Diagnosis present

## 2015-03-14 DIAGNOSIS — I714 Abdominal aortic aneurysm, without rupture, unspecified: Secondary | ICD-10-CM | POA: Diagnosis present

## 2015-03-14 DIAGNOSIS — T148XXA Other injury of unspecified body region, initial encounter: Secondary | ICD-10-CM

## 2015-03-14 DIAGNOSIS — I10 Essential (primary) hypertension: Secondary | ICD-10-CM | POA: Diagnosis present

## 2015-03-14 DIAGNOSIS — D696 Thrombocytopenia, unspecified: Secondary | ICD-10-CM | POA: Diagnosis present

## 2015-03-14 DIAGNOSIS — J189 Pneumonia, unspecified organism: Secondary | ICD-10-CM

## 2015-03-14 DIAGNOSIS — S72009A Fracture of unspecified part of neck of unspecified femur, initial encounter for closed fracture: Secondary | ICD-10-CM | POA: Diagnosis present

## 2015-03-14 DIAGNOSIS — Z7901 Long term (current) use of anticoagulants: Secondary | ICD-10-CM | POA: Insufficient documentation

## 2015-03-14 DIAGNOSIS — Y92099 Unspecified place in other non-institutional residence as the place of occurrence of the external cause: Secondary | ICD-10-CM | POA: Diagnosis not present

## 2015-03-14 DIAGNOSIS — F419 Anxiety disorder, unspecified: Secondary | ICD-10-CM | POA: Diagnosis present

## 2015-03-14 DIAGNOSIS — H547 Unspecified visual loss: Secondary | ICD-10-CM | POA: Diagnosis present

## 2015-03-14 HISTORY — DX: Supraventricular tachycardia, unspecified: I47.10

## 2015-03-14 HISTORY — DX: Atherosclerotic heart disease of native coronary artery without angina pectoris: I25.10

## 2015-03-14 HISTORY — DX: Peripheral vascular disease, unspecified: I73.9

## 2015-03-14 HISTORY — DX: Personal history of transient ischemic attack (TIA), and cerebral infarction without residual deficits: Z86.73

## 2015-03-14 HISTORY — DX: Supraventricular tachycardia: I47.1

## 2015-03-14 HISTORY — DX: Disorder of arteries and arterioles, unspecified: I77.9

## 2015-03-14 HISTORY — DX: Aortic aneurysm of unspecified site, without rupture: I71.9

## 2015-03-14 HISTORY — DX: Essential (primary) hypertension: I10

## 2015-03-14 HISTORY — DX: Unspecified atherosclerosis of native arteries of extremities, unspecified extremity: I70.209

## 2015-03-14 LAB — CBC WITH DIFFERENTIAL/PLATELET
BASOS ABS: 0 10*3/uL (ref 0.0–0.1)
Basophils Relative: 0 % (ref 0–1)
EOS ABS: 0.1 10*3/uL (ref 0.0–0.7)
EOS PCT: 1 % (ref 0–5)
HCT: 37.3 % — ABNORMAL LOW (ref 39.0–52.0)
HEMOGLOBIN: 12.6 g/dL — AB (ref 13.0–17.0)
LYMPHS ABS: 0.5 10*3/uL — AB (ref 0.7–4.0)
LYMPHS PCT: 6 % — AB (ref 12–46)
MCH: 33.2 pg (ref 26.0–34.0)
MCHC: 33.8 g/dL (ref 30.0–36.0)
MCV: 98.2 fL (ref 78.0–100.0)
Monocytes Absolute: 0.4 10*3/uL (ref 0.1–1.0)
Monocytes Relative: 4 % (ref 3–12)
NEUTROS PCT: 90 % — AB (ref 43–77)
Neutro Abs: 8.7 10*3/uL — ABNORMAL HIGH (ref 1.7–7.7)
PLATELETS: 108 10*3/uL — AB (ref 150–400)
RBC: 3.8 MIL/uL — AB (ref 4.22–5.81)
RDW: 13.5 % (ref 11.5–15.5)
WBC: 9.7 10*3/uL (ref 4.0–10.5)

## 2015-03-14 LAB — BASIC METABOLIC PANEL
ANION GAP: 4 — AB (ref 5–15)
BUN: 11 mg/dL (ref 6–20)
CHLORIDE: 107 mmol/L (ref 101–111)
CO2: 28 mmol/L (ref 22–32)
Calcium: 8.6 mg/dL — ABNORMAL LOW (ref 8.9–10.3)
Creatinine, Ser: 0.83 mg/dL (ref 0.61–1.24)
GFR calc Af Amer: >60 mL/min (ref >60)
Glucose, Bld: 120 mg/dL — ABNORMAL HIGH (ref 65–99)
POTASSIUM: 3.7 mmol/L (ref 3.5–5.1)
SODIUM: 139 mmol/L (ref 135–145)

## 2015-03-14 LAB — ABO/RH: ABO/RH(D): O POS

## 2015-03-14 MED ORDER — METHOCARBAMOL 1000 MG/10ML IJ SOLN
500.0000 mg | Freq: Four times a day (QID) | INTRAVENOUS | Status: DC
  Administered 2015-03-14 – 2015-03-17 (×12): 500 mg via INTRAVENOUS
  Filled 2015-03-14 (×16): qty 5

## 2015-03-14 MED ORDER — HYDROMORPHONE HCL 1 MG/ML IJ SOLN: 0.5000 mg | INTRAMUSCULAR | Status: DC | PRN

## 2015-03-14 MED ORDER — FOLIC ACID 1 MG PO TABS
1500.0000 ug | ORAL_TABLET | Freq: Every day | ORAL | Status: DC
  Administered 2015-03-14 – 2015-03-24 (×11): 1.5 mg via ORAL
  Filled 2015-03-14 (×11): qty 2

## 2015-03-14 MED ORDER — ONDANSETRON HCL 4 MG/2ML IJ SOLN: 4.0000 mg | Freq: Three times a day (TID) | INTRAMUSCULAR | Status: DC | PRN

## 2015-03-14 MED ORDER — HEPARIN SODIUM (PORCINE) 5000 UNIT/ML IJ SOLN
5000.0000 [IU] | Freq: Three times a day (TID) | INTRAMUSCULAR | Status: AC
  Administered 2015-03-14 (×2): 5000 [IU] via SUBCUTANEOUS
  Filled 2015-03-14 (×2): qty 1

## 2015-03-14 MED ORDER — SODIUM CHLORIDE 0.9 % IJ SOLN
3.0000 mL | Freq: Two times a day (BID) | INTRAMUSCULAR | Status: DC
  Administered 2015-03-14 – 2015-03-22 (×9): 3 mL via INTRAVENOUS

## 2015-03-14 MED ORDER — LISINOPRIL 10 MG PO TABS
40.0000 mg | ORAL_TABLET | Freq: Every day | ORAL | Status: DC
  Administered 2015-03-14 – 2015-03-24 (×11): 40 mg via ORAL
  Filled 2015-03-14 (×2): qty 4
  Filled 2015-03-14: qty 8
  Filled 2015-03-14 (×10): qty 4

## 2015-03-14 MED ORDER — ACETAMINOPHEN 500 MG PO TABS: 1000.0000 mg | ORAL_TABLET | Freq: Four times a day (QID) | ORAL | Status: DC | PRN

## 2015-03-14 MED ORDER — ATORVASTATIN CALCIUM 40 MG PO TABS
40.0000 mg | ORAL_TABLET | Freq: Every day | ORAL | Status: DC
  Administered 2015-03-14 – 2015-03-23 (×10): 40 mg via ORAL
  Filled 2015-03-14 (×10): qty 1

## 2015-03-14 MED ORDER — ONDANSETRON HCL 4 MG PO TABS: 4.0000 mg | ORAL_TABLET | Freq: Four times a day (QID) | ORAL | Status: DC | PRN

## 2015-03-14 MED ORDER — HYDROMORPHONE HCL 1 MG/ML IJ SOLN
0.5000 mg | Freq: Once | INTRAMUSCULAR | Status: AC
  Administered 2015-03-14: 0.5 mg via INTRAVENOUS

## 2015-03-14 MED ORDER — ONDANSETRON HCL 4 MG/2ML IJ SOLN
INTRAMUSCULAR | Status: AC
  Filled 2015-03-14: qty 2

## 2015-03-14 MED ORDER — ONDANSETRON HCL 4 MG/2ML IJ SOLN
4.0000 mg | Freq: Once | INTRAMUSCULAR | Status: AC
Start: 2015-03-14 — End: 2015-03-14
  Administered 2015-03-14: 4 mg via INTRAVENOUS
  Filled 2015-03-14: qty 2

## 2015-03-14 MED ORDER — ONDANSETRON HCL 4 MG/2ML IJ SOLN
4.0000 mg | Freq: Four times a day (QID) | INTRAMUSCULAR | Status: DC | PRN
  Administered 2015-03-16 – 2015-03-23 (×6): 4 mg via INTRAVENOUS
  Filled 2015-03-14 (×6): qty 2

## 2015-03-14 MED ORDER — HYDROMORPHONE HCL 1 MG/ML IJ SOLN
0.5000 mg | INTRAMUSCULAR | Status: DC | PRN
  Administered 2015-03-14 – 2015-03-15 (×10): 0.5 mg via INTRAVENOUS
  Filled 2015-03-14 (×11): qty 1

## 2015-03-14 MED ORDER — DOCUSATE SODIUM 100 MG PO CAPS
100.0000 mg | ORAL_CAPSULE | Freq: Two times a day (BID) | ORAL | Status: DC
  Administered 2015-03-14 – 2015-03-22 (×17): 100 mg via ORAL
  Filled 2015-03-14 (×18): qty 1

## 2015-03-14 MED ORDER — CHLORHEXIDINE GLUCONATE 4 % EX LIQD
60.0000 mL | Freq: Once | CUTANEOUS | Status: AC
  Administered 2015-03-15: 4 via TOPICAL
  Filled 2015-03-14: qty 15

## 2015-03-14 MED ORDER — SODIUM CHLORIDE 0.9 % IV SOLN: Freq: Once | INTRAVENOUS | Status: DC

## 2015-03-14 MED ORDER — ASPIRIN EC 81 MG PO TBEC
81.0000 mg | DELAYED_RELEASE_TABLET | Freq: Every day | ORAL | Status: DC
  Filled 2015-03-14: qty 1

## 2015-03-14 MED ORDER — DEXTROSE-NACL 5-0.9 % IV SOLN
INTRAVENOUS | Status: DC
  Administered 2015-03-14: 15:00:00 via INTRAVENOUS

## 2015-03-14 MED ORDER — METOPROLOL SUCCINATE ER 50 MG PO TB24
50.0000 mg | ORAL_TABLET | Freq: Every day | ORAL | Status: DC
  Administered 2015-03-14 – 2015-03-19 (×6): 50 mg via ORAL
  Filled 2015-03-14 (×6): qty 1

## 2015-03-14 MED ORDER — IOHEXOL 350 MG/ML SOLN
100.0000 mL | Freq: Once | INTRAVENOUS | Status: AC | PRN
  Administered 2015-03-14: 100 mL via INTRAVENOUS

## 2015-03-14 MED ORDER — CLOPIDOGREL BISULFATE 75 MG PO TABS
75.0000 mg | ORAL_TABLET | Freq: Every day | ORAL | Status: DC
  Filled 2015-03-14: qty 1

## 2015-03-14 MED ORDER — LABETALOL HCL 5 MG/ML IV SOLN
10.0000 mg | INTRAVENOUS | Status: DC
  Administered 2015-03-14 (×2): 10 mg via INTRAVENOUS
  Filled 2015-03-14 (×4): qty 4

## 2015-03-14 MED ORDER — ONDANSETRON HCL 4 MG/2ML IJ SOLN
4.0000 mg | Freq: Once | INTRAMUSCULAR | Status: AC
  Administered 2015-03-14: 4 mg via INTRAVENOUS

## 2015-03-14 MED ORDER — ADULT MULTIVITAMIN W/MINERALS CH
1.0000 | ORAL_TABLET | Freq: Every day | ORAL | Status: DC
  Administered 2015-03-14 – 2015-03-24 (×11): 1 via ORAL
  Filled 2015-03-14 (×11): qty 1

## 2015-03-14 NOTE — ED Notes (Signed)
Lab at bedside

## 2015-03-14 NOTE — Progress Notes (Signed)
His AAA is now 6.9cm.  I spoke with Dr Scot Dock again, and though I offered to patient and his family to transfer him to [facility] (thank you) for a full consultation, it is unlikely that emergent surgical intervention will be done for his AAA, and that his rupture risk is about 15-20 percent the following year, that his ORIF should not increase this risk.  The patient and family would like to proceed with ORIF here at [facility], and expressed full understanding that should his AAA ruptures, he likely will die.  Thank you.

## 2015-03-14 NOTE — Consult Note (Signed)
Reason for Consult: Right hip fracture Referring Physician: Dr. Maren Reamer is an 81 y.o. male.  HPI: 81 year old male who is blind tried to sit down in a chair fell and fractured his right hip. He is a standby assist household ambulator who has been in stable condition lately. Has a AAA which was last evaluated in 2013. Vascular surgeon has been consult and advised that surgery can proceed. He is also on Plavix. While that usually may take 48 hours to wear off, sent to affects platelets we will have platelets available to give the patient prior to surgery so that we may proceed with the surgery instead of waiting 48 hours.  He complains of muscle spasms severe pain and inability to walk. Pain located over the right hip and is nonradiating, times less than 24 hours.  Past Medical History  Diagnosis Date  . Hypertension   . COPD (chronic obstructive pulmonary disease)   . Stroke     no deficits  . Glaucoma   . BPH (benign prostatic hyperplasia)   . Anxiety   . Depression   . Blindness - both eyes   . GERD (gastroesophageal reflux disease)   . WCHENIDP(824.2)     Past Surgical History  Procedure Laterality Date  . Cardiac surgery    . 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 Coronary artery bypass graft  2004  . Cataract extraction      left eye 2009  . Eye surgery      corneal ruptured and removed eye completely  . Cholecystectomy  20+ yrs ago  . Carotid endarterectomy      right  . Transurethral resection of prostate    . Transurethral resection of prostate  08/23/2011    Procedure: TRANSURETHRAL RESECTION OF THE PROSTATE (TURP);  Surgeon: Marissa Nestle, MD;  Location: [facility];  Service: Urology;  Laterality: N/A;    Family History  Problem Relation Age of Onset  . Anesthesia problems Neg Hx   . Hypotension Neg Hx   . Malignant hyperthermia Neg Hx   . Pseudochol deficiency Neg Hx     Social History:  reports that  he has been smoking Cigarettes.  He has a 75 pack-year smoking history. He has never used smokeless tobacco. He reports that he does not drink alcohol or use illicit drugs.  Allergies:  Allergies  Allergen Reactions  . Codeine Hives  . Penicillins Hives    Medications: I have reviewed the patient's current medications.  Results for orders placed or performed during the hospital encounter of 03/14/15 (from the past 48 hour(s))  CBC with Differential     Status: Abnormal   Collection Time: 03/14/15 10:24 AM  Result Value Ref Range   WBC 9.7 4.0 - 10.5 K/uL   RBC 3.80 (L) 4.22 - 5.81 MIL/uL   Hemoglobin 12.6 (L) 13.0 - 17.0 g/dL   HCT 37.3 (L) 39.0 - 52.0 %   MCV 98.2 78.0 - 100.0 fL   MCH 33.2 26.0 - 34.0 pg   MCHC 33.8 30.0 - 36.0 g/dL   RDW 13.5 11.5 - 15.5 %   Platelets 108 (L) 150 - 400 K/uL    Comment: SPECIMEN CHECKED FOR CLOTS PLATELET COUNT CONFIRMED BY SMEAR    Neutrophils Relative % 90 (H) 43 - 77 %   Neutro Abs 8.7 (H) 1.7 - 7.7 K/uL   Lymphocytes Relative 6 (L)  12 - 46 %   Lymphs Abs 0.5 (L) 0.7 - 4.0 K/uL   Monocytes Relative 4 3 - 12 %   Monocytes Absolute 0.4 0.1 - 1.0 K/uL   Eosinophils Relative 1 0 - 5 %   Eosinophils Absolute 0.1 0.0 - 0.7 K/uL   Basophils Relative 0 0 - 1 %   Basophils Absolute 0.0 0.0 - 0.1 K/uL  Basic metabolic panel     Status: Abnormal   Collection Time: 03/14/15 10:24 AM  Result Value Ref Range   Sodium 139 135 - 145 mmol/L   Potassium 3.7 3.5 - 5.1 mmol/L   Chloride 107 101 - 111 mmol/L   CO2 28 22 - 32 mmol/L   Glucose, Bld 120 (H) 65 - 99 mg/dL   BUN 11 6 - 20 mg/dL   Creatinine, Ser 0.83 0.61 - 1.24 mg/dL   Calcium 8.6 (L) 8.9 - 10.3 mg/dL   GFR calc non Af Amer >60 >60 mL/min   GFR calc Af Amer >60 >60 mL/min    Comment: (NOTE) The eGFR has been calculated using the CKD EPI equation. This calculation has not been validated in all clinical situations. eGFR's persistently <60 mL/min signify possible Chronic  Kidney Disease.    Anion gap 4 (L) 5 - 15    Dg Chest 1 View  03/14/2015   CLINICAL DATA:  [place] this morning when trying to sit in a chair.  EXAM: CHEST  1 VIEW  COMPARISON:  05/13/2012.  FINDINGS: The heart remains normal in size. Stable post CABG changes. The lungs remain clear and hyperexpanded. Thoracolumbar spine degenerative changes. Interval gaseous distention of the stomach.  IMPRESSION: Stable changes of COPD.  Gaseous distention of the stomach.   Electronically Signed   By: Claudie Revering M.D.   On: 03/14/2015 10:15   Dg Hip Unilat With Pelvis 2-3 Views Right  03/14/2015   CLINICAL DATA:  [place] this morning trying to sit down on a chair. Pain in the right hip.  EXAM: DG HIP (WITH OR WITHOUT PELVIS) 2-3V RIGHT  COMPARISON:  08/07/2011  FINDINGS: There is a comminuted but minimally displaced fracture of the right hip. This appears to involve the basicervical region as well as intertrochanteric region.  The visualized portion of the pelvis is intact. Left hip is intact. Dense atherosclerotic calcification of the scratched a dense arterial calcifications.  IMPRESSION: 1. Comminuted fracture of the right hip involving the basicervical and intertrochanteric region. 2. No dislocation.   Electronically Signed   By: Nolon Nations M.D.   On: 03/14/2015 10:03    Review of Systems  Unable to perform ROS: acuity of condition   Blood pressure 101/49, pulse 94, temperature 98 F (36.7 C), temperature source Oral, resp. rate 18, height _0  (1.753 m), weight 136 lb 6.4 oz (61.871 kg), SpO2 93 %. Physical Exam  General appearance the patient is without deformity other than the shortening external rotation of the right hip. He has a small frame has no visible skin lesions  Orientation  person place and time normal  Mood and affect  anxious  Gait and station  cannot walk  Cardiovascular 2+ distal pulses palpable aortic aneurysm mid abdomen  Lymph system negative groin and axilla  Sensation  normal both feet and both arms  Deep tendon and pathologic reflexes exam  toes downgoing  Coordination and balance could not assess  Extremities/Skin (4) inspection range of motion stability tests and strength tests were normal for the upper extremities  and skin was normal  Left lower extremity was in normal alignment, hip knee and ankle stable. Muscle tone normal. No tenderness. Skin normal.  Right lower extremity was shortened externally rotated. Palpable tenderness left greater trochanter left proximal thigh and hip. Knee and ankle stable. Muscle tone normal. Skin normal. Range of motion tests deferred because of pain.  Assessment/Plan: Problem #1 fracture right hip, intertrochanteric fracture right hip. Plan for gamma nail short locked. Problem #2 Plavix. Platelets will be given prior to surgery patient type and cross for 3 units to stay 2 units ahead., Expect less than 200 mL blood loss. Problem #3 aortic aneurysm not evaluated 3 years. CT angiogram pending  Surgery planned for 11 AM 03/15/2015 with platelets available for transfusion. Arther Abbott 03/14/2015, 2:19 PM

## 2015-03-14 NOTE — ED Notes (Signed)
Report given to [facility] all questions answered.

## 2015-03-14 NOTE — ED Provider Notes (Signed)
CSN: [id number]     Arrival date & time 03/14/15  7425 History   First MD Initiated Contact with Patient 03/14/15 [phone]     Chief Complaint  Patient presents with  . Fall     (Consider location/radiation/quality/duration/timing/severity/associated sxs/prior Treatment) HPI 81 year old male who presents after fall. He has a history of hypertension, CVA, COPD, and blindness. Ambulates with assistance, and was walking back from the restroom with his son this morning. He was assisted into a swivel chair, but the chair swivels out from underneath him, and he fell on his right side. He did not have head strike or loss of consciousness. He is not on any anticoagulation. Complains of significant pain on the right hip, was unable to ambulate after his fall. EMS was subsequently called, and in route received 6 mg of morphine. He denies any headache, nausea vomiting, back pain, neck pain, chest pain, difficulty breathing, or abdominal pain. He is otherwise been in his recent state of health, denies any recent illnesses, fevers, or chills. Past Medical History  Diagnosis Date  . Hypertension   . COPD (chronic obstructive pulmonary disease)   . Stroke     no deficits  . Glaucoma   . BPH (benign prostatic hyperplasia)   . Anxiety   . Depression   . Blindness - both eyes   . GERD (gastroesophageal reflux disease)   . OVFIEPPI(951.8)    Past Surgical History  Procedure Laterality Date  . Cardiac surgery    . Eye surgery      stick removed from eye as child;deadened nerves in right eye age 27  . Coronary artery bypass graft  2004  . Cataract extraction      left eye 2009  . Eye surgery      corneal ruptured and removed eye completely  . Cholecystectomy  20+ yrs ago  . Carotid endarterectomy      right  . Transurethral resection of prostate    . Transurethral resection of prostate  08/23/2011    Procedure: TRANSURETHRAL RESECTION OF THE PROSTATE (TURP);  Surgeon: Marissa Nestle, MD;  Location:  [facility];  Service: Urology;  Laterality: N/A;   Family History  Problem Relation Age of Onset  . Anesthesia problems Neg Hx   . Hypotension Neg Hx   . Malignant hyperthermia Neg Hx   . Pseudochol deficiency Neg Hx    Social History  Substance Use Topics  . Smoking status: Current Every Day Smoker -- 1.50 packs/day for 50 years    Types: Cigarettes  . Smokeless tobacco: Never Used  . Alcohol Use: No    Review of Systems 10/14 systems reviewed and are negative other than those stated in the HPI   Allergies  Codeine and Penicillins  Home Medications   Prior to Admission medications   Medication Sig Start Date End Date Taking? Authorizing Provider  acetaminophen (TYLENOL) 500 MG tablet Take 1,000 mg by mouth every 6 (six) hours as needed for mild pain.   Yes Historical Provider, MD  aspirin EC 81 MG tablet Take 81 mg by mouth daily.   Yes Historical Provider, MD  clopidogrel (PLAVIX) 75 MG tablet Take 75 mg by mouth daily.   Yes Historical Provider, MD  ferrous sulfate 325 (65 FE) MG tablet Take 325 mg by mouth daily.     Yes Historical Provider, MD  folic acid (FOLVITE) 841 MCG tablet Take 1,600 mcg by mouth daily.   Yes Historical Provider, MD  lisinopril (PRINIVIL,ZESTRIL) 40 MG  tablet Take 40 mg by mouth daily.   Yes Historical Provider, MD  metoprolol succinate (TOPROL-XL) 50 MG 24 hr tablet Take 50 mg by mouth daily. Take with or immediately following a meal.   Yes Historical Provider, MD  Multiple Vitamin (MULTIVITAMIN WITH MINERALS) TABS tablet Take 1 tablet by mouth daily.   Yes Historical Provider, MD  simvastatin (ZOCOR) 80 MG tablet Take 40 mg by mouth daily. For cholesterol   Yes Historical Provider, MD   BP 188/112 mmHg  Pulse 91  Temp(Src) 97.8 F (36.6 C) (Oral)  Resp 19  Ht 5\' 9"  (1.753 m)  Wt 145 lb (65.772 kg)  BMI 21.40 kg/m2  SpO2 95% Physical Exam Physical Exam  Nursing note and vitals reviewed. Constitutional: Well developed, well nourished,  non-toxic, and in no acute distress Head: Normocephalic and atraumatic.  Mouth/Throat: Oropharynx is clear and moist.  Neck: Normal range of motion. Neck supple.  no cervical spine tenderness. Cardiovascular: Normal rate and regular rhythm.   +2 DP pulses bilaterally. Pulmonary/Chest: Effort normal and breath sounds normal. No chest wall tenderness  Abdominal: Soft. There is no tenderness. There is no rebound and no guarding.  Musculoskeletal: No acute deformities, but tenderness to palpation of the anterior aspect of the right hip. Limited range of motion due to significant pain. Neurological: Alert, no facial droop, fluent speech,  intact sensation involving the superficial, deep, common peroneal nerves and the saphenous and sural nerves bilaterally. Skin: Skin is warm and dry.  Psychiatric: Cooperative  ED Course  Procedures (including critical care time) Labs Review Labs Reviewed  CBC WITH DIFFERENTIAL/PLATELET - Abnormal; Notable for the following:    RBC 3.80 (*)    Hemoglobin 12.6 (*)    HCT 37.3 (*)    Neutrophils Relative % 90 (*)    Neutro Abs 8.7 (*)    Lymphocytes Relative 6 (*)    Lymphs Abs 0.5 (*)    All other components within normal limits  BASIC METABOLIC PANEL    Imaging Review Dg Chest 1 View  03/14/2015   CLINICAL DATA:  [place] this morning when trying to sit in a chair.  EXAM: CHEST  1 VIEW  COMPARISON:  05/13/2012.  FINDINGS: The heart remains normal in size. Stable post CABG changes. The lungs remain clear and hyperexpanded. Thoracolumbar spine degenerative changes. Interval gaseous distention of the stomach.  IMPRESSION: Stable changes of COPD.  Gaseous distention of the stomach.   Electronically Signed   By: Claudie Revering M.D.   On: 03/14/2015 10:15   Dg Hip Unilat With Pelvis 2-3 Views Right  03/14/2015   CLINICAL DATA:  [place] this morning trying to sit down on a chair. Pain in the right hip.  EXAM: DG HIP (WITH OR WITHOUT PELVIS) 2-3V RIGHT  COMPARISON:   08/07/2011  FINDINGS: There is a comminuted but minimally displaced fracture of the right hip. This appears to involve the basicervical region as well as intertrochanteric region.  The visualized portion of the pelvis is intact. Left hip is intact. Dense atherosclerotic calcification of the scratched a dense arterial calcifications.  IMPRESSION: 1. Comminuted fracture of the right hip involving the basicervical and intertrochanteric region. 2. No dislocation.   Electronically Signed   By: Nolon Nations M.D.   On: 03/14/2015 10:03   I have personally reviewed and evaluated these images and lab results as part of my medical decision-making.   EKG Interpretation   Date/Time:  Sunday March 14 2015 08:38:21 EDT Ventricular Rate:  89 PR Interval:  189 QRS Duration: 91 QT Interval:  394 QTC Calculation: 479 R Axis:   76 Text Interpretation:  Normal Sinus rhythm Nonspecific repol abnormality,  diffuse leads Confirmed by Chelsey Kimberley MD, Pooja Camuso ([id number]) on 03/14/2015 8:45:29 AM      MDM   Final diagnoses:  Hip fracture, right, closed, initial encounter  Fall, initial encounter    81 year old male with history of blindness and prior CVA, who presents after fall with right hip pain. He is nontoxic and in no acute distress on arrival. Extremities neurovascularly intact, but he has significantly limited range of motion due to pain in the right hip. No appreciable deformities or swelling is noted. No other acute injuries are noted on exam. X-ray of the hip and pelvis reveals right intertrochanteric hip fracture. I discussed this patient with Dr. Aline Brochure from orthopedic surgery, who will evaluate the patient as an inpatient with further recommendations at that time. Discussed with hospitalist, and admitted for ongoing management.  Forde Dandy, MD 03/14/15 1048

## 2015-03-14 NOTE — Progress Notes (Signed)
Utilization review Completed Dexter Sauser RN BSN   

## 2015-03-14 NOTE — ED Notes (Signed)
Pt comes in by EMS for a fall. Pt fell out of his chair while trying to sit in it.  Pt c/o right hip pain. Pt has IV established and has been given 6 mg Morphine. Pt cannot straighten leg.

## 2015-03-14 NOTE — H&P (Signed)
[facility] History and Physical  [name] WIO:[id number] DOB: 12-08-1928    PCP:   Lanette Hampshire, MD   Chief Complaint: Fracture hip.   HPI: [name] is an 81 y.o. male with hx of HTN, total blindness, 5.6 cm stable AAA (poor surgical candidate, followed by Dr Kellie Simmering), prior CVA, anxiety, depression, lives at home with his son, GERD, known CAD with CABG in the 90's, with no DOE or angina symptoms, tripped, fell, and suffered a comminuted Fx of the right hip.  His Cr is normal and Hb was at 12.6.  Dr Aline Brochure was consulted and will see him.  Hospitalist was asked to admit him for same.   Rewiew of Systems:  Constitutional: Negative for malaise, fever and chills. No significant weight loss or weight gain Eyes: Negative for eye pain, redness and discharge, diplopia, visual changes, or flashes of light. ENMT: Negative for ear pain, hoarseness, nasal congestion, sinus pressure and sore throat. No headaches; tinnitus, drooling, or problem swallowing. Cardiovascular: Negative for chest pain, palpitations, diaphoresis, dyspnea and peripheral edema. ; No orthopnea, PND Respiratory: Negative for cough, hemoptysis, wheezing and stridor. No pleuritic chestpain. Gastrointestinal: Negative for nausea, vomiting, diarrhea, constipation, abdominal pain, melena, blood in stool, hematemesis, jaundice and rectal bleeding.    Genitourinary: Negative for frequency, dysuria, incontinence,flank pain and hematuria; Musculoskeletal: Negative for back pain and neck pain. Negative for swelling and trauma.;  Skin: . Negative for pruritus, rash, abrasions, bruising and skin lesion.; ulcerations Neuro: Negative for headache, lightheadedness and neck stiffness. Negative for weakness, altered level of consciousness , altered mental status, extremity weakness, burning feet, involuntary movement, seizure and syncope.  Psych: negative for anxiety, depression, insomnia, tearfulness, panic attacks, hallucinations,  paranoia, suicidal or homicidal ideation    Past Medical History  Diagnosis Date  . Hypertension   . COPD (chronic obstructive pulmonary disease)   . Stroke     no deficits  . Glaucoma   . BPH (benign prostatic hyperplasia)   . Anxiety   . Depression   . Blindness - both eyes   . GERD (gastroesophageal reflux disease)   . EQASTMHD(622.2)     Past Surgical History  Procedure Laterality Date  . Cardiac surgery    . Eye surgery      stick removed from eye as child;deadened nerves in right eye age 54  . Coronary artery bypass graft  2004  . Cataract extraction      left eye 2009  . Eye surgery      corneal ruptured and removed eye completely  . Cholecystectomy  20+ yrs ago  . Carotid endarterectomy      right  . Transurethral resection of prostate    . Transurethral resection of prostate  08/23/2011    Procedure: TRANSURETHRAL RESECTION OF THE PROSTATE (TURP);  Surgeon: Marissa Nestle, MD;  Location: [facility];  Service: Urology;  Laterality: N/A;    Medications:  HOME MEDS: Prior to Admission medications   Medication Sig Start Date End Date Taking? Authorizing Provider  acetaminophen (TYLENOL) 500 MG tablet Take 1,000 mg by mouth every 6 (six) hours as needed for mild pain.   Yes Historical Provider, MD  aspirin EC 81 MG tablet Take 81 mg by mouth daily.   Yes Historical Provider, MD  clopidogrel (PLAVIX) 75 MG tablet Take 75 mg by mouth daily.   Yes Historical Provider, MD  ferrous sulfate 325 (65 FE) MG tablet Take 325 mg by mouth daily.  Yes Historical Provider, MD  folic acid (FOLVITE) 093 MCG tablet Take 1,600 mcg by mouth daily.   Yes Historical Provider, MD  lisinopril (PRINIVIL,ZESTRIL) 40 MG tablet Take 40 mg by mouth daily.   Yes Historical Provider, MD  metoprolol succinate (TOPROL-XL) 50 MG 24 hr tablet Take 50 mg by mouth daily. Take with or immediately following a meal.   Yes Historical Provider, MD  Multiple Vitamin (MULTIVITAMIN WITH MINERALS) TABS  tablet Take 1 tablet by mouth daily.   Yes Historical Provider, MD  simvastatin (ZOCOR) 80 MG tablet Take 40 mg by mouth daily. For cholesterol   Yes Historical Provider, MD     Allergies:  Allergies  Allergen Reactions  . Codeine Hives  . Penicillins Hives    Social History:   reports that he has been smoking Cigarettes.  He has a 75 pack-year smoking history. He has never used smokeless tobacco. He reports that he does not drink alcohol or use illicit drugs.  Family History: Family History  Problem Relation Age of Onset  . Anesthesia problems Neg Hx   . Hypotension Neg Hx   . Malignant hyperthermia Neg Hx   . Pseudochol deficiency Neg Hx      Physical Exam: Filed Vitals:   03/14/15 0930 03/14/15 1000 03/14/15 1030 03/14/15 1100  BP: 188/112 159/88 182/91 168/94  Pulse: 91 86 90 96  Temp:      TempSrc:      Resp: 19 16 21  35  Height:      Weight:      SpO2: 95% 97% 98% 100%   Blood pressure 168/94, pulse 96, temperature 97.8 F (36.6 C), temperature source Oral, resp. rate 35, height 5\' 9"  (1.753 m), weight 65.772 kg (145 lb), SpO2 100 %.  GEN:  Pleasant  patient lying in the stretcher in no acute distress; cooperative with exam. PSYCH:  alert and oriented x4; does not appear anxious or depressed; affect is appropriate. HEENT: Mucous membranes pink and anicteric; PERRLA; EOM intact; no cervical lymphadenopathy nor thyromegaly or carotid bruit; no JVD; There were no stridor. Neck is very supple. Breasts:: Not examined CHEST WALL: No tenderness CHEST: Normal respiration, clear to auscultation bilaterally.  HEART: Regular rate and rhythm.  There are no murmur, rub, or gallops.   BACK: No kyphosis or scoliosis; no CVA tenderness ABDOMEN: soft and non-tender; no masses, no organomegaly, normal abdominal bowel sounds; no pannus; no intertriginous candida. There is no rebound and no distention. Rectal Exam: Not done EXTREMITIES: No bone or joint deformity; age-appropriate  arthropathy of the hands and knees; no edema; no ulcerations.  There is no calf tenderness. Genitalia: not examined PULSES: 2+ and symmetric SKIN: Normal hydration no rash or ulceration CNS: Cranial nerves 2-12 grossly intact no focal lateralizing neurologic deficit.  Speech is fluent; uvula elevated with phonation, facial symmetry and tongue midline. DTR are normal bilaterally, cerebella exam is intact, barbinski is negative and strengths are equaled bilaterally.  No sensory loss.   Labs on Admission:  Basic Metabolic Panel:  Recent Labs Lab 03/14/15 1024  NA 139  K 3.7  CL 107  CO2 28  GLUCOSE 120*  BUN 11  CREATININE 0.83  CALCIUM 8.6*   CBC:  Recent Labs Lab 03/14/15 1024  WBC 9.7  NEUTROABS 8.7*  HGB 12.6*  HCT 37.3*  MCV 98.2  PLT 108*    Radiological Exams on Admission: Dg Chest 1 View  03/14/2015   CLINICAL DATA:  [place] this morning when trying to  sit in a chair.  EXAM: CHEST  1 VIEW  COMPARISON:  05/13/2012.  FINDINGS: The heart remains normal in size. Stable post CABG changes. The lungs remain clear and hyperexpanded. Thoracolumbar spine degenerative changes. Interval gaseous distention of the stomach.  IMPRESSION: Stable changes of COPD.  Gaseous distention of the stomach.   Electronically Signed   By: Claudie Revering M.D.   On: 03/14/2015 10:15   Dg Hip Unilat With Pelvis 2-3 Views Right  03/14/2015   CLINICAL DATA:  [place] this morning trying to sit down on a chair. Pain in the right hip.  EXAM: DG HIP (WITH OR WITHOUT PELVIS) 2-3V RIGHT  COMPARISON:  08/07/2011  FINDINGS: There is a comminuted but minimally displaced fracture of the right hip. This appears to involve the basicervical region as well as intertrochanteric region.  The visualized portion of the pelvis is intact. Left hip is intact. Dense atherosclerotic calcification of the scratched a dense arterial calcifications.  IMPRESSION: 1. Comminuted fracture of the right hip involving the basicervical and  intertrochanteric region. 2. No dislocation.   Electronically Signed   By: Nolon Nations M.D.   On: 03/14/2015 10:03   Assessment/Plan Present on Admission:  . Hip fracture . AAA (abdominal aortic aneurysm) without rupture . HTN (hypertension) . Blindness  PLAN:  Patient with hx of stable but large AAA, known CAD s/p CABG with no anginal symptoms, admitted for closed hip Fx.  I spoke with Dr Scot Dock of vascular surgery, and he felt that the AAA should not interfear with his hip surgery.  He recommended to follow up with Dr Kellie Simmering post op.  I spoke with patient and family, and that he has higher risk of surgical complication, but should proceed with surgery, as his condition is optimized at this time.  He has had no chest pain, DOE, and his AAA has been stable.  Will obtain abd CTA to evaluate its current size.  For his HTN, will continue with his meds.  I discussed code status, and confirmed that he is a full code.  Will admit him to Dr Everette Rank as per prior arrangement.  Thank you and Good Day.   Other plans as per orders.  Code Status: FULL Haskel Khan, MD. [facility] Pager [phone] 7pm to 7am.  03/14/2015, 11:39 AM

## 2015-03-14 NOTE — ED Notes (Signed)
MD at bedside. 

## 2015-03-15 ENCOUNTER — Encounter (HOSPITAL_COMMUNITY): Payer: Self-pay | Admitting: Anesthesiology

## 2015-03-15 ENCOUNTER — Inpatient Hospital Stay (HOSPITAL_COMMUNITY): Payer: Medicare Other | Admitting: Anesthesiology

## 2015-03-15 ENCOUNTER — Encounter (HOSPITAL_COMMUNITY)
Admission: EM | Disposition: A | Discharge status: Skilled Nursing Facility | Payer: Self-pay | Source: Home / Self Care | Attending: Family Medicine

## 2015-03-15 ENCOUNTER — Inpatient Hospital Stay (HOSPITAL_COMMUNITY): Payer: Medicare Other

## 2015-03-15 DIAGNOSIS — S72141A Displaced intertrochanteric fracture of right femur, initial encounter for closed fracture: Secondary | ICD-10-CM | POA: Diagnosis present

## 2015-03-15 HISTORY — PX: INTRAMEDULLARY (IM) NAIL INTERTROCHANTERIC: SHX5875

## 2015-03-15 LAB — COMPREHENSIVE METABOLIC PANEL
ALK PHOS: 135 U/L — AB (ref 38–126)
ALT: 130 U/L — AB (ref 17–63)
ANION GAP: 7 (ref 5–15)
AST: 118 U/L — ABNORMAL HIGH (ref 15–41)
Albumin: 3.5 g/dL (ref 3.5–5.0)
BILIRUBIN TOTAL: 0.5 mg/dL (ref 0.3–1.2)
BUN: 11 mg/dL (ref 6–20)
CALCIUM: 8.7 mg/dL — AB (ref 8.9–10.3)
CO2: 27 mmol/L (ref 22–32)
CREATININE: 0.83 mg/dL (ref 0.61–1.24)
Chloride: 106 mmol/L (ref 101–111)
Glucose, Bld: 112 mg/dL — ABNORMAL HIGH (ref 65–99)
Potassium: 3.9 mmol/L (ref 3.5–5.1)
SODIUM: 140 mmol/L (ref 135–145)
TOTAL PROTEIN: 5.8 g/dL — AB (ref 6.5–8.1)

## 2015-03-15 LAB — CBC
HEMATOCRIT: 34.9 % — AB (ref 39.0–52.0)
Hemoglobin: 11.6 g/dL — ABNORMAL LOW (ref 13.0–17.0)
MCH: 33.2 pg (ref 26.0–34.0)
MCHC: 33.2 g/dL (ref 30.0–36.0)
MCV: 100 fL (ref 78.0–100.0)
PLATELETS: 111 10*3/uL — AB (ref 150–400)
RBC: 3.49 MIL/uL — ABNORMAL LOW (ref 4.22–5.81)
RDW: 13.8 % (ref 11.5–15.5)
WBC: 9.4 10*3/uL (ref 4.0–10.5)

## 2015-03-15 LAB — PREPARE RBC (CROSSMATCH)

## 2015-03-15 LAB — PROTIME-INR
INR: 1.24 (ref 0.00–1.49)
PROTHROMBIN TIME: 15.8 s — AB (ref 11.6–15.2)

## 2015-03-15 LAB — SURGICAL PCR SCREEN
MRSA, PCR: NEGATIVE
STAPHYLOCOCCUS AUREUS: NEGATIVE

## 2015-03-15 SURGERY — FIXATION, FRACTURE, INTERTROCHANTERIC, WITH INTRAMEDULLARY ROD
Anesthesia: General | Laterality: Right

## 2015-03-15 MED ORDER — BUPIVACAINE-EPINEPHRINE (PF) 0.5% -1:200000 IJ SOLN
INTRAMUSCULAR | Status: DC | PRN
  Administered 2015-03-15: 60 mL

## 2015-03-15 MED ORDER — LACTATED RINGERS IV SOLN
INTRAVENOUS | Status: DC | PRN
  Administered 2015-03-15: 1000 mL
  Administered 2015-03-15: 13:00:00 via INTRAVENOUS

## 2015-03-15 MED ORDER — LIDOCAINE HCL (PF) 1 % IJ SOLN
INTRAMUSCULAR | Status: AC
  Filled 2015-03-15: qty 5

## 2015-03-15 MED ORDER — ACETAMINOPHEN 10 MG/ML IV SOLN
1000.0000 mg | Freq: Four times a day (QID) | INTRAVENOUS | Status: DC
  Filled 2015-03-15 (×3): qty 100

## 2015-03-15 MED ORDER — ROCURONIUM BROMIDE 100 MG/10ML IV SOLN
INTRAVENOUS | Status: DC | PRN
  Administered 2015-03-15: 20 mg via INTRAVENOUS
  Administered 2015-03-15: 5 mg via INTRAVENOUS

## 2015-03-15 MED ORDER — CLINDAMYCIN PHOSPHATE 600 MG/50ML IV SOLN
600.0000 mg | Freq: Four times a day (QID) | INTRAVENOUS | Status: AC
  Administered 2015-03-15 – 2015-03-16 (×2): 600 mg via INTRAVENOUS
  Filled 2015-03-15 (×2): qty 50

## 2015-03-15 MED ORDER — SODIUM CHLORIDE 0.9 % IJ SOLN
INTRAMUSCULAR | Status: AC
  Filled 2015-03-15: qty 10

## 2015-03-15 MED ORDER — HEPARIN SODIUM (PORCINE) 5000 UNIT/ML IJ SOLN
5000.0000 [IU] | Freq: Three times a day (TID) | INTRAMUSCULAR | Status: AC
  Administered 2015-03-15 – 2015-03-16 (×2): 5000 [IU] via SUBCUTANEOUS
  Filled 2015-03-15 (×2): qty 1

## 2015-03-15 MED ORDER — PROPOFOL 10 MG/ML IV BOLUS
INTRAVENOUS | Status: AC
  Filled 2015-03-15: qty 20

## 2015-03-15 MED ORDER — MAGNESIUM CITRATE PO SOLN: 1.0000 | Freq: Once | ORAL | Status: DC | PRN

## 2015-03-15 MED ORDER — MIDAZOLAM HCL 2 MG/2ML IJ SOLN
INTRAMUSCULAR | Status: AC
  Filled 2015-03-15: qty 4

## 2015-03-15 MED ORDER — SUCCINYLCHOLINE CHLORIDE 20 MG/ML IJ SOLN
INTRAMUSCULAR | Status: DC | PRN
  Administered 2015-03-15: 100 mg via INTRAVENOUS

## 2015-03-15 MED ORDER — SODIUM CHLORIDE 0.9 % IR SOLN
Status: DC | PRN
  Administered 2015-03-15: 1000 mL

## 2015-03-15 MED ORDER — EPHEDRINE SULFATE 50 MG/ML IJ SOLN
INTRAMUSCULAR | Status: AC
  Filled 2015-03-15: qty 1

## 2015-03-15 MED ORDER — METOCLOPRAMIDE HCL 5 MG/ML IJ SOLN
5.0000 mg | Freq: Three times a day (TID) | INTRAMUSCULAR | Status: DC | PRN
  Administered 2015-03-19: 10 mg via INTRAVENOUS
  Filled 2015-03-15: qty 2

## 2015-03-15 MED ORDER — HYDROMORPHONE HCL 1 MG/ML IJ SOLN: 1.0000 mg | INTRAMUSCULAR | Status: DC | PRN

## 2015-03-15 MED ORDER — BISACODYL 10 MG RE SUPP: 10.0000 mg | Freq: Every day | RECTAL | Status: DC | PRN

## 2015-03-15 MED ORDER — MIDAZOLAM HCL 5 MG/5ML IJ SOLN
INTRAMUSCULAR | Status: DC | PRN
  Administered 2015-03-15 (×2): 1 mg via INTRAVENOUS

## 2015-03-15 MED ORDER — PHENOL 1.4 % MT LIQD
1.0000 | OROMUCOSAL | Status: DC | PRN
  Filled 2015-03-15: qty 177

## 2015-03-15 MED ORDER — NEOSTIGMINE METHYLSULFATE 10 MG/10ML IV SOLN
INTRAVENOUS | Status: DC | PRN
  Administered 2015-03-15: 2 mg via INTRAVENOUS

## 2015-03-15 MED ORDER — ALUM & MAG HYDROXIDE-SIMETH 200-200-20 MG/5ML PO SUSP: 30.0000 mL | ORAL | Status: DC | PRN

## 2015-03-15 MED ORDER — MENTHOL 3 MG MT LOZG: 1.0000 | LOZENGE | OROMUCOSAL | Status: DC | PRN

## 2015-03-15 MED ORDER — HYDROMORPHONE HCL 1 MG/ML IJ SOLN
INTRAMUSCULAR | Status: AC
  Filled 2015-03-15: qty 1

## 2015-03-15 MED ORDER — FENTANYL CITRATE (PF) 100 MCG/2ML IJ SOLN
INTRAMUSCULAR | Status: DC | PRN
  Administered 2015-03-15: 25 ug via INTRAVENOUS
  Administered 2015-03-15 (×5): 50 ug via INTRAVENOUS
  Administered 2015-03-15: 25 ug via INTRAVENOUS

## 2015-03-15 MED ORDER — TRAMADOL HCL 50 MG PO TABS
50.0000 mg | ORAL_TABLET | Freq: Once | ORAL | Status: DC
  Filled 2015-03-15: qty 1

## 2015-03-15 MED ORDER — TRAMADOL HCL 50 MG PO TABS
50.0000 mg | ORAL_TABLET | Freq: Four times a day (QID) | ORAL | Status: DC
  Administered 2015-03-15 – 2015-03-24 (×33): 50 mg via ORAL
  Filled 2015-03-15 (×33): qty 1

## 2015-03-15 MED ORDER — ACETAMINOPHEN 10 MG/ML IV SOLN
1000.0000 mg | Freq: Four times a day (QID) | INTRAVENOUS | Status: AC
  Administered 2015-03-15: 1000 mg via INTRAVENOUS
  Filled 2015-03-15: qty 100

## 2015-03-15 MED ORDER — POLYETHYLENE GLYCOL 3350 17 G PO PACK
17.0000 g | PACK | Freq: Every day | ORAL | Status: DC
  Administered 2015-03-16 – 2015-03-22 (×6): 17 g via ORAL
  Filled 2015-03-15 (×6): qty 1

## 2015-03-15 MED ORDER — GLYCOPYRROLATE 0.2 MG/ML IJ SOLN
INTRAMUSCULAR | Status: DC | PRN
  Administered 2015-03-15: .3 mg via INTRAVENOUS

## 2015-03-15 MED ORDER — LABETALOL HCL 5 MG/ML IV SOLN
INTRAVENOUS | Status: DC | PRN
  Administered 2015-03-15: 2.5 mg via INTRAVENOUS

## 2015-03-15 MED ORDER — BUPIVACAINE-EPINEPHRINE (PF) 0.5% -1:200000 IJ SOLN
INTRAMUSCULAR | Status: AC
  Filled 2015-03-15: qty 60

## 2015-03-15 MED ORDER — FENTANYL CITRATE (PF) 100 MCG/2ML IJ SOLN
INTRAMUSCULAR | Status: AC
  Filled 2015-03-15: qty 4

## 2015-03-15 MED ORDER — SODIUM CHLORIDE 0.9 % IV SOLN
INTRAVENOUS | Status: DC
  Administered 2015-03-16 – 2015-03-22 (×15): via INTRAVENOUS

## 2015-03-15 MED ORDER — METOCLOPRAMIDE HCL 10 MG PO TABS: 5.0000 mg | ORAL_TABLET | Freq: Three times a day (TID) | ORAL | Status: DC | PRN

## 2015-03-15 MED ORDER — SODIUM CHLORIDE 0.9 % IV SOLN: Freq: Once | INTRAVENOUS | Status: DC

## 2015-03-15 MED ORDER — IPRATROPIUM-ALBUTEROL 0.5-2.5 (3) MG/3ML IN SOLN
3.0000 mL | Freq: Four times a day (QID) | RESPIRATORY_TRACT | Status: DC | PRN
  Administered 2015-03-19: 3 mL via RESPIRATORY_TRACT
  Filled 2015-03-15: qty 3

## 2015-03-15 MED ORDER — HYDROMORPHONE HCL 1 MG/ML IJ SOLN
0.5000 mg | INTRAMUSCULAR | Status: DC | PRN
  Administered 2015-03-16 – 2015-03-24 (×17): 0.5 mg via INTRAVENOUS
  Filled 2015-03-15 (×19): qty 1

## 2015-03-15 MED ORDER — LABETALOL HCL 5 MG/ML IV SOLN
INTRAVENOUS | Status: AC
  Filled 2015-03-15: qty 4

## 2015-03-15 MED ORDER — NEOSTIGMINE METHYLSULFATE 10 MG/10ML IV SOLN
INTRAVENOUS | Status: AC
  Filled 2015-03-15: qty 1

## 2015-03-15 MED ORDER — GLYCOPYRROLATE 0.2 MG/ML IJ SOLN
INTRAMUSCULAR | Status: AC
  Filled 2015-03-15: qty 2

## 2015-03-15 MED ORDER — ETOMIDATE 2 MG/ML IV SOLN
INTRAVENOUS | Status: DC | PRN
  Administered 2015-03-15: 10 mg via INTRAVENOUS

## 2015-03-15 MED ORDER — HYDROMORPHONE HCL 1 MG/ML IJ SOLN
1.0000 mg | INTRAMUSCULAR | Status: AC
  Administered 2015-03-15: 1 mg via INTRAVENOUS

## 2015-03-15 MED ORDER — CLINDAMYCIN PHOSPHATE 900 MG/50ML IV SOLN
900.0000 mg | INTRAVENOUS | Status: AC
  Administered 2015-03-15: 900 mg via INTRAVENOUS
  Filled 2015-03-15 (×2): qty 50

## 2015-03-15 MED ORDER — ACETAMINOPHEN 10 MG/ML IV SOLN
1000.0000 mg | Freq: Four times a day (QID) | INTRAVENOUS | Status: AC
Start: 2015-03-15 — End: 2015-03-16
  Administered 2015-03-15 – 2015-03-16 (×3): 1000 mg via INTRAVENOUS
  Filled 2015-03-15 (×3): qty 100

## 2015-03-15 MED ORDER — FENTANYL CITRATE (PF) 100 MCG/2ML IJ SOLN
INTRAMUSCULAR | Status: AC
Start: 2015-03-15 — End: 2015-03-15
  Filled 2015-03-15: qty 4

## 2015-03-15 MED ORDER — LABETALOL HCL 5 MG/ML IV SOLN
10.0000 mg | INTRAVENOUS | Status: DC | PRN
  Administered 2015-03-19 – 2015-03-20 (×2): 10 mg via INTRAVENOUS
  Filled 2015-03-15 (×2): qty 4

## 2015-03-15 SURGICAL SUPPLY — 51 items
BAG HAMPER (MISCELLANEOUS) ×3 IMPLANT
BIT DRILL AO GAMMA 4.2X300 (BIT) ×3 IMPLANT
BLADE HEX COATED 2.75 (ELECTRODE) ×3 IMPLANT
BNDG GAUZE ELAST 4 BULKY (GAUZE/BANDAGES/DRESSINGS) ×3 IMPLANT
CHLORAPREP W/TINT 26ML (MISCELLANEOUS) ×3 IMPLANT
CLOTH BEACON ORANGE TIMEOUT ST (SAFETY) ×3 IMPLANT
COVER LIGHT HANDLE STERIS (MISCELLANEOUS) ×6 IMPLANT
COVER MAYO STAND XLG (DRAPE) ×3 IMPLANT
DECANTER SPIKE VIAL GLASS SM (MISCELLANEOUS) ×3 IMPLANT
DRAPE STERI IOBAN 125X83 (DRAPES) ×3 IMPLANT
DRESSING MEPILEX BORDER 6X8 (GAUZE/BANDAGES/DRESSINGS) ×1 IMPLANT
DRSG AQUACEL AG ADV 3.5X10 (GAUZE/BANDAGES/DRESSINGS) ×3 IMPLANT
DRSG MEPILEX BORDER 6X8 (GAUZE/BANDAGES/DRESSINGS) ×3
ELECT REM PT RETURN 9FT ADLT (ELECTROSURGICAL) ×3
ELECTRODE REM PT RTRN 9FT ADLT (ELECTROSURGICAL) ×1 IMPLANT
GLOVE BIOGEL M 7.0 STRL (GLOVE) ×3 IMPLANT
GLOVE BIOGEL PI IND STRL 7.0 (GLOVE) ×3 IMPLANT
GLOVE BIOGEL PI INDICATOR 7.0 (GLOVE) ×6
GLOVE SKINSENSE NS SZ8.0 LF (GLOVE) ×2
GLOVE SKINSENSE STRL SZ8.0 LF (GLOVE) ×1 IMPLANT
GLOVE SS N UNI LF 8.5 STRL (GLOVE) ×3 IMPLANT
GOWN STRL REUS W/TWL LRG LVL3 (GOWN DISPOSABLE) ×6 IMPLANT
GOWN STRL REUS W/TWL XL LVL3 (GOWN DISPOSABLE) ×3 IMPLANT
GUIDEROD T2 3X1000 (ROD) ×3 IMPLANT
INST SET MAJOR BONE (KITS) ×3 IMPLANT
K-WIRE  3.2X450M STR (WIRE) ×2
K-WIRE 3.2X450M STR (WIRE) ×1
KIT BLADEGUARD II DBL (SET/KITS/TRAYS/PACK) ×3 IMPLANT
KIT ROOM TURNOVER AP CYSTO (KITS) ×3 IMPLANT
KWIRE 3.2X450M STR (WIRE) ×1 IMPLANT
MANIFOLD NEPTUNE II (INSTRUMENTS) ×3 IMPLANT
MARKER SKIN DUAL TIP RULER LAB (MISCELLANEOUS) ×3 IMPLANT
NAIL TROCH GAMMA 11X18 (Nail) ×3 IMPLANT
NEEDLE HYPO 21X1.5 SAFETY (NEEDLE) ×3 IMPLANT
NEEDLE SPNL 18GX3.5 QUINCKE PK (NEEDLE) ×3 IMPLANT
NS IRRIG 1000ML POUR BTL (IV SOLUTION) ×3 IMPLANT
PACK BASIC III (CUSTOM PROCEDURE TRAY) ×2
PACK SRG BSC III STRL LF ECLPS (CUSTOM PROCEDURE TRAY) ×1 IMPLANT
PENCIL HANDSWITCHING (ELECTRODE) ×3 IMPLANT
SCREW LAG GAMMA 3 TI 10.5X90MM (Screw) ×3 IMPLANT
SCREW LOCKING T2 F/T  5X37.5MM (Screw) ×2 IMPLANT
SCREW LOCKING T2 F/T 5X37.5MM (Screw) ×1 IMPLANT
SET BASIN LINEN APH (SET/KITS/TRAYS/PACK) ×3 IMPLANT
SPONGE LAP 18X18 X RAY DECT (DISPOSABLE) ×6 IMPLANT
STAPLER VISISTAT 35W (STAPLE) ×3 IMPLANT
SUT MNCRL 0 VIOLET CTX 36 (SUTURE) ×1 IMPLANT
SUT MON AB 2-0 CT1 36 (SUTURE) ×3 IMPLANT
SUT MONOCRYL 0 CTX 36 (SUTURE) ×2
SYR 30ML LL (SYRINGE) ×3 IMPLANT
SYR BULB IRRIGATION 50ML (SYRINGE) ×6 IMPLANT
YANKAUER SUCT 12FT TUBE ARGYLE (SUCTIONS) ×3 IMPLANT

## 2015-03-15 NOTE — Op Note (Addendum)
Operative report on 03/15/2015  Time 3 PM  History this is an 81 year old male who has multiple medical problems including abdominal aortic aneurysm who presented with a right hip fracture secondary to mechanical fall. Preoperative workup included CT angiogram and vascular surgery consultation. The patient's family accepted the risks of surgery related to the hip fracture with the underlying AAA.  Preop diagnosis right intertrochanteric hip fracture  Postop diagnosis same  Procedure open treatment internal fixation with intramedullary nail right hip  Implant gamma nail size 125 short with a 90 mm lag screw a proximal acorn in sliding mode and a 37.5 mm distal locking bolt  Surgeon Aline Brochure no assistants  Gen. anesthesia  100 mL estimated blood loss  Details of procedure The patient was identified in the preop area the right hip was marked for surgery the chart review was completed implants and radiographs were reviewed and available.  The patient had general anesthesia with no complications. He was transferred to the fracture table. The left leg was placed in a well-padded well leg holder  The right leg was placed in traction  The C-arm was brought in and AP and lateral radiographs confirmed reduction after mild internal rotation.  Timeout procedure was completed after sterile prep and drape  The incision was made over the proximal trochanter the all was passed into the femur radiographs confirmed position of the all. The long guidewire was placed down the cannulated awl the all was removed position was confirmed with x-ray.  125 nail was passed over the guidewire. Once it was in appropriate position a second incision was made proximally and with the use of a cannula a threaded guidepin was placed in the center of the femoral head on the lateral view and slightly inferior on the AP view. Once this was satisfactory. We measured it as a 90 mm lag screw. We overreamed the guidewire  with the triple reamer set at 90. Past the lag screw. Radiographs confirmed its position. We then turned our attention distally. A third incision was made with the distal locking screw. The cannula was advanced to bone drill was advanced across the distal locking hole. We measured off the drill bit. We passed a 37.5 mm screw  Final x-rays confirmed adequate reduction and stabilization and hardware position  All wounds were copiously irrigated. The proximal wound was closed with 2 layers of 0 Monocryl and staples. The second incision was closed with 2-0 Monocryl. The third with staples. The other 2 incisions were reapproximated with staples. The incisions were then injected with 60 mL of Marcaine with epinephrine  Sterile dressings were applied  Limb position was checked and found to be anatomic  Patient was taken to the recovery room in stable condition Cpt 27245

## 2015-03-15 NOTE — Progress Notes (Signed)
Patient's BP is 123/57. MD notified. Verbal order given to hold 12AM dose of labetalol. Will continue to monitor.

## 2015-03-15 NOTE — Anesthesia Postprocedure Evaluation (Signed)
  Anesthesia Post-op Note  Patient: [name]  Procedure(s) Performed: Procedure(s): INTRAMEDULLARY (IM) GAMMA NAIL INTERTROCHANTRIC RIGHT HIP (Right)  Patient Location: PACU  Anesthesia Type:General  Level of Consciousness: sedated  Airway and Oxygen Therapy: Patient Spontanous Breathing and Patient connected to nasal cannula oxygen  Post-op Pain: none  Post-op Assessment: Post-op Vital signs reviewed, Patient's Cardiovascular Status Stable, Respiratory Function Stable, Patent Airway and No signs of Nausea or vomiting              Post-op Vital Signs: Reviewed and stable  Last Vitals:  Filed Vitals:   03/15/15 1515  BP: 97/46  Pulse: 88  Temp: 37.1 C  Resp: 18    Complications: No apparent anesthesia complications

## 2015-03-15 NOTE — Brief Op Note (Signed)
03/14/2015 - 03/15/2015  2:29 PM  PATIENT:  [name]  81 y.o. male  PRE-OPERATIVE DIAGNOSIS:  right hip intertrochanteric fracture  POST-OPERATIVE DIAGNOSIS:  right hip intertrochanteric fracture  PROCEDURE:  Procedure(s): INTRAMEDULLARY (IM) GAMMA NAIL INTERTROCHANTRIC RIGHT HIP (Right)   short 125 degree gamma nail  90 mm lag screw  37.5 distal lock screw Acorn sliding mode  SURGEON:  Surgeon(s) and Role:    * Carole Civil, MD - Primary  PHYSICIAN ASSISTANT:   ASSISTANTS: none   ANESTHESIA:   general  EBL:  Total I/O In: 901 [I.V.:700; Blood:201] Out: 250 [Urine:150; Blood:100]  BLOOD ADMINISTERED:1 unit PLTS  DRAINS: none   LOCAL MEDICATIONS USED:  MARCAINE     SPECIMEN:  No Specimen  DISPOSITION OF SPECIMEN:  N/A  COUNTS:  YES  TOURNIQUET:  * No tourniquets in log *  DICTATION: .Dragon Dictation  PLAN OF CARE: Admit to inpatient   PATIENT DISPOSITION:  ICU - extubated and stable.   Delay start of Pharmacological VTE agent (>24hrs) due to surgical blood loss or risk of bleeding: no

## 2015-03-15 NOTE — Progress Notes (Signed)
Patient will require ORIF for right hip he incidentally has a 6.9 AAA which telephone consult at [facility] by Dr. Truman Hayward reassured that this should not be an issue during the preoperative operative and postoperative. His likely could've ruptured is the same as if he were not having surgery I do not see me met on the chart will ordered stat is a heavy smoker does have COPD we'll require nebulizer therapy postoperatively [name] NTI:[id number] DOB: Mar 09, 1929 DOA: 03/14/2015 PCP: Lanette Hampshire, MD             Physical Exam: Blood pressure 146/52, pulse 87, temperature 99 F (37.2 C), temperature source Oral, resp. rate 18, height 5' 9"  (1.753 m), weight 136 lb 6.4 oz (61.871 kg), SpO2 96 %. lungs show prolonged his return x-ray phase scattered rhonchi no rales appreciable heart regular rhythm no S3-S4 no heaves thrills rubs abdomen soft nontender bowel sounds normoactive   Investigations:  Recent Results (from the past 240 hour(s))  Surgical pcr screen     Status: None   Collection Time: 03/14/15 10:30 PM  Result Value Ref Range Status   MRSA, PCR NEGATIVE NEGATIVE Final   Staphylococcus aureus NEGATIVE NEGATIVE Final    Comment:        The Xpert SA Assay (FDA approved for NASAL specimens in patients over 74 years of age), is one component of a comprehensive surveillance program.  Test performance has been validated by [facility] for patients greater than or equal to 34 year old. It is not intended to diagnose infection nor to guide or monitor treatment.      Basic Metabolic Panel:  Recent Labs  03/14/15 1024 03/15/15 0545  NA 139 140  K 3.7 3.9  CL 107 106  CO2 28 27  GLUCOSE 120* 112*  BUN 11 11  CREATININE 0.83 0.83  CALCIUM 8.6* 8.7*   Liver Function Tests:  Recent Labs  03/15/15 0545  AST 118*  ALT 130*  ALKPHOS 135*  BILITOT 0.5  PROT 5.8*  ALBUMIN 3.5     CBC:  Recent Labs  03/14/15 1024 03/15/15 0545  WBC 9.7 9.4  NEUTROABS 8.7*   --   HGB 12.6* 11.6*  HCT 37.3* 34.9*  MCV 98.2 100.0  PLT 108* 111*    Dg Chest 1 View  03/14/2015   CLINICAL DATA:  [place] this morning when trying to sit in a chair.  EXAM: CHEST  1 VIEW  COMPARISON:  05/13/2012.  FINDINGS: The heart remains normal in size. Stable post CABG changes. The lungs remain clear and hyperexpanded. Thoracolumbar spine degenerative changes. Interval gaseous distention of the stomach.  IMPRESSION: Stable changes of COPD.  Gaseous distention of the stomach.   Electronically Signed   By: Claudie Revering M.D.   On: 03/14/2015 10:15   Dg Hip Unilat With Pelvis 2-3 Views Right  03/14/2015   CLINICAL DATA:  [place] this morning trying to sit down on a chair. Pain in the right hip.  EXAM: DG HIP (WITH OR WITHOUT PELVIS) 2-3V RIGHT  COMPARISON:  08/07/2011  FINDINGS: There is a comminuted but minimally displaced fracture of the right hip. This appears to involve the basicervical region as well as intertrochanteric region.  The visualized portion of the pelvis is intact. Left hip is intact. Dense atherosclerotic calcification of the scratched a dense arterial calcifications.  IMPRESSION: 1. Comminuted fracture of the right hip involving the basicervical and intertrochanteric region. 2. No dislocation.   Electronically Signed   By:  Nolon Nations M.D.   On: 03/14/2015 10:03   Ct Angio Abd/pel W/ And/or W/o  03/14/2015   CLINICAL DATA:  Followup abdominal aortic aneurysm.  EXAM: CTA ABDOMEN AND PELVIS WITHOUT AND WITH CONTRAST  TECHNIQUE: Multidetector CT imaging of the abdomen and pelvis was performed using the standard protocol during bolus administration of intravenous contrast. Multiplanar reconstructed images and MIPs were obtained and reviewed to evaluate the vascular anatomy.  CONTRAST:  129m OMNIPAQUE IOHEXOL 350 MG/ML SOLN  COMPARISON:  Previous examinations, the most recent dated 08/07/2011.  FINDINGS: Again demonstrated is a large infrarenal abdominal aortic aneurysm. This currently  measures 6.9 cm in maximum diameter on image number 59. This previously measured 5.9 cm in maximum diameter. Extensive mural thrombus is again demonstrated. The iliac arteries are normal in caliber. There are extensive arterial calcifications. No retroperitoneal hemorrhage is seen.  The prostate gland is moderately enlarged. Mild diffuse bladder wall thickening. 4 mm mid left renal calculus. Lower pole left renal cyst. The previously demonstrated 1.9 cm medium to high density upper pole left renal mass measures 2.0 cm in maximum diameter on image number 25 and 73 Hounsfield units in density, representing no significant change. 5 mm upper pole right renal calculus. No ureteral calculi or hydronephrosis on either side. No bladder calculi.  Unremarkable spleen, pancreas and right adrenal gland. Surgically absent gallbladder. A medium to low density left adrenal mass has not changed significantly, measuring 2.7 x 1.5 cm on image 19.  No gastrointestinal abnormalities or enlarged lymph nodes. No evidence of appendicitis. Clear lung bases. Lumbar and lower thoracic spine degenerative changes.  Review of the MIP images confirms the above findings.  IMPRESSION: 1. Interval increase in size of the infrarenal abdominal aortic aneurysm, currently measuring 6.9 cm in maximum diameter. Vascular surgery consultation recommended due to increased risk of rupture for AAA >5.5 cm. This recommendation follows ACR consensus guidelines: White Paper of the ACR Incidental Findings Committee II on Vascular Findings. J Am Coll Radiol 2013; 10:789-794. 2. Small, nonobstructing bilateral renal calculi. 3. Stable probable hemorrhagic or proteinaceous cyst in the upper pole of the left kidney. 4. Stable small lateral segment left lobe probable cyst. 5. Moderately enlarged prostate gland without significant change. 6. Extensive atheromatous arterial calcifications. 7. Stable left adrenal probable adenoma.   Electronically Signed   By: SClaudie ReveringM.D.   On: 03/14/2015 16:54      Medications:   Impression: Copd  Principal Problem:   Hip fracture Active Problems:   AAA (abdominal aortic aneurysm) without rupture   HTN (hypertension)   Blindness   AAA (abdominal aortic aneurysm)   On continuous oral anticoagulation     Plan: We'll add DuoNeb nebulizer therapy every ID when necessary postoperatively. be met daily 3  Consultants: Orthopedic surgery   Procedures for ORIF today   Antibiotics:                   Code Status:   Family Communication:  Discussed at length risk benefit of surgery with entire family  Disposition Plan see plan above  Time spent: 30 minutes   LOS: 1 day   Bita Cartwright M   03/15/2015, 10:14 AM

## 2015-03-15 NOTE — Care Management Note (Signed)
Case Management Note  Patient Details  Name: TERRY BOLOTIN MRN: [id number] Date of Birth: 1928-10-29  Expected Discharge Date:     03/18/2015             Expected Discharge Plan:  [place]  In-House Referral:  Clinical Social Work  Discharge planning Services  CM Consult  Post Acute Care Choice:  NA Choice offered to:  NA  DME Arranged:    DME Agency:     HH Arranged:    [place] Agency:     Status of Service:  In process, will continue to follow  Medicare Important Message Given:    Date Medicare IM Given:    Medicare IM give by:    Date Additional Medicare IM Given:    Additional Medicare Important Message give by:     If discussed at [place] of Stay Meetings, dates discussed:    Additional Comments: Pt admitted for hip fx and undergoing surgery today. Pt from home, anticipate need for SNF at DC. CSW is aware. Will cont to follow for DC planning.  Sherald Barge, RN 03/15/2015, 3:30 PM

## 2015-03-15 NOTE — Transfer of Care (Addendum)
Immediate Anesthesia Transfer of Care Note  Patient: [name]  Procedure(s) Performed: Procedure(s): INTRAMEDULLARY (IM) GAMMA NAIL INTERTROCHANTRIC RIGHT HIP (Right)  Patient Location: PACU  Anesthesia Type:General  Level of Consciousness: awake  Airway & Oxygen Therapy: Patient Spontanous Breathing and Patient connected to face mask oxygen  Post-op Assessment: Report given to RN  Post vital signs: Reviewed and stable, NSR with occasional PVC's.    Last Vitals:  Filed Vitals:   03/15/15 1230  BP: 119/35  Pulse: 73  Temp: 37 C  Resp: 16    Complications: No apparent anesthesia complications, Heart rate in upper 90's.  Labatelol po dose due at three, Iv 2.5 mg given IV

## 2015-03-15 NOTE — Anesthesia Procedure Notes (Signed)
Procedure Name: Intubation Date/Time: 03/15/2015 12:52 PM Performed by: Tressie Stalker E Pre-anesthesia Checklist: Patient identified, Patient being monitored, Timeout performed, Emergency Drugs available and Suction available Patient Re-evaluated:Patient Re-evaluated prior to inductionOxygen Delivery Method: Circle System Utilized Preoxygenation: Pre-oxygenation with 100% oxygen Intubation Type: IV induction Ventilation: Mask ventilation without difficulty Laryngoscope Size: Mac and 3 Grade View: Grade I Tube type: Oral Tube size: 7.0 mm Number of attempts: 1 Airway Equipment and Method: Stylet Placement Confirmation: ETT inserted through vocal cords under direct vision,  positive ETCO2 and breath sounds checked- equal and bilateral Secured at: 21 cm Tube secured with: Tape Dental Injury: Teeth and Oropharynx as per pre-operative assessment

## 2015-03-15 NOTE — Progress Notes (Signed)
Preoperative note  Diagnosis right hip intertrochanteric fracture   Plan surgical treatment IM Gamma nail  CBC  CBC Latest Ref Rng 03/15/2015 03/14/2015 08/24/2011  WBC 4.0 - 10.5 K/uL 9.4 9.7 7.7  Hemoglobin 13.0 - 17.0 g/dL 11.6(L) 12.6(L) 10.9(L)  Hematocrit 39.0 - 52.0 % 34.9(L) 37.3(L) 32.7(L)  Platelets 150 - 400 K/uL 111(L) 108(L) 98(L)    BMET    Component Value Date/Time   NA 140 03/15/2015 0545   K 3.9 03/15/2015 0545   CL 106 03/15/2015 0545   CO2 27 03/15/2015 0545   GLUCOSE 112* 03/15/2015 0545   BUN 11 03/15/2015 0545   CREATININE 0.83 03/15/2015 0545   CALCIUM 8.7* 03/15/2015 0545   GFRNONAA >60 03/15/2015 0545   GFRAA >60 03/15/2015 0545    INR=1.24  BLOOD UNITS AVAILABLE 3u + 1 unit of 6 platelets  Chest x-ray  NAD copd  EKG  nsr  Plain film findings  Stable intertrochanteric fracture   OR NOTIFIED yes   CT ANGIO and Vascular consult: 6.9 AAA no increase risk from hip surgery, family aware of risk

## 2015-03-15 NOTE — Anesthesia Preprocedure Evaluation (Addendum)
Anesthesia Evaluation  Patient identified by MRN, date of birth, ID band Patient awake    Reviewed: Allergy & Precautions, NPO status , Patient's Chart, lab work & pertinent test results  Airway Mallampati: II  TM Distance: >3 FB     Dental  (+) Edentulous Upper, Edentulous Lower   Pulmonary COPDCurrent Smoker,  breath sounds clear to auscultation        Cardiovascular hypertension, Pt. on home beta blockers + CAD and + CABG Rhythm:Regular Rate:Normal  AAA   Neuro/Psych CVA, No Residual Symptoms    GI/Hepatic GERD-  ,  Endo/Other    Renal/GU      Musculoskeletal   Abdominal   Peds  Hematology   Anesthesia Other Findings   Reproductive/Obstetrics                         Anesthesia Physical Anesthesia Plan  ASA: IV and emergent  Anesthesia Plan: General   Post-op Pain Management:    Induction: Intravenous, Rapid sequence and Cricoid pressure planned  Airway Management Planned: Oral ETT  Additional Equipment:   Intra-op Plan:   Post-operative Plan: Extubation in OR  Informed Consent: I have reviewed the patients History and Physical, chart, labs and discussed the procedure including the risks, benefits and alternatives for the proposed anesthesia with the patient or authorized representative who has indicated his/her understanding and acceptance.     Plan Discussed with: Anesthesiologist and Surgeon  Anesthesia Plan Comments:        Anesthesia Quick Evaluation

## 2015-03-16 LAB — CBC
HCT: 27 % — ABNORMAL LOW (ref 39.0–52.0)
Hemoglobin: 9.2 g/dL — ABNORMAL LOW (ref 13.0–17.0)
MCH: 33.7 pg (ref 26.0–34.0)
MCHC: 34.1 g/dL (ref 30.0–36.0)
MCV: 98.9 fL (ref 78.0–100.0)
PLATELETS: 81 10*3/uL — AB (ref 150–400)
RBC: 2.73 MIL/uL — ABNORMAL LOW (ref 4.22–5.81)
RDW: 13.7 % (ref 11.5–15.5)
WBC: 7.3 10*3/uL (ref 4.0–10.5)

## 2015-03-16 LAB — BASIC METABOLIC PANEL
Anion gap: 6 (ref 5–15)
BUN: 13 mg/dL (ref 6–20)
CALCIUM: 7.8 mg/dL — AB (ref 8.9–10.3)
CO2: 26 mmol/L (ref 22–32)
Chloride: 104 mmol/L (ref 101–111)
Creatinine, Ser: 0.78 mg/dL (ref 0.61–1.24)
GFR calc Af Amer: >60 mL/min (ref >60)
GLUCOSE: 115 mg/dL — AB (ref 65–99)
POTASSIUM: 4 mmol/L (ref 3.5–5.1)
Sodium: 136 mmol/L (ref 135–145)

## 2015-03-16 LAB — PREPARE PLATELET PHERESIS: UNIT DIVISION: 0

## 2015-03-16 NOTE — Plan of Care (Signed)
Problem: Phase II Progression Outcomes Goal: Progress activity as tolerated unless otherwise ordered Outcome: Progressing P.T. HAD PT UP IN RECLINER X2HR. PT TOLERATED WELL. Goal: Discharge plan established Outcome: Progressing [facility] Goal: Obtain order to discontinue catheter if appropriate Outcome: Progressing TOMORROW WILL BE 2ND DAY POSTOP. TO BE REMOVED THEN.

## 2015-03-16 NOTE — Progress Notes (Signed)
PT ALERT AND ORIENTED.LT FOREARM IV PATENT. RTHIP FOAM DRY DRY AND INTACT. BILATERAL SCD'S OFF X30MIN THEN REAPPLIED. FOLEY CATH PATENT DRAINING CLEAR AMBER URINE. HR 80'S IN  NSR. TRANSFER REPORT CALLED TO MATT RN . PT TRANSFERRED TO ROOM [id number]

## 2015-03-16 NOTE — Addendum Note (Signed)
Addendum  created 03/16/15 0853 by Vista Deck, CRNA   Modules edited: Notes Section   Notes Section:  File: [id number]

## 2015-03-16 NOTE — Addendum Note (Signed)
Addendum  created 03/16/15 0902 by Vista Deck, CRNA   Modules edited: Charges VN

## 2015-03-16 NOTE — Progress Notes (Signed)
Postoperative note  Postoperative day number  1 Status post RT GAMMA NAIL  Vital signs  BP 129/56 mmHg  Pulse 61  Temp(Src) 98.6 F (37 C) (Axillary)  Resp 32  Ht 5\' 9"  (1.753 m)  Wt 143 lb 11.8 oz (65.2 kg)  BMI 21.22 kg/m2  SpO2 99%   Pertinent labs   CBC Latest Ref Rng 03/16/2015 03/15/2015 03/14/2015  WBC 4.0 - 10.5 K/uL 7.3 9.4 9.7  Hemoglobin 13.0 - 17.0 g/dL 9.2(L) 11.6(L) 12.6(L)  Hematocrit 39.0 - 52.0 % 27.0(L) 34.9(L) 37.3(L)  Platelets 150 - 400 K/uL 81(L) 111(L) 108(L)    BMET    Component Value Date/Time   NA 136 03/16/2015 0417   K 4.0 03/16/2015 0417   CL 104 03/16/2015 0417   CO2 26 03/16/2015 0417   GLUCOSE 115* 03/16/2015 0417   BUN 13 03/16/2015 0417   CREATININE 0.78 03/16/2015 0417   CALCIUM 7.8* 03/16/2015 0417   GFRNONAA >60 03/16/2015 0417   GFRAA >60 03/16/2015 0417    Assessment and plan   NO SIGNIF ARRHYTMIAS  TRANSFER TO FLOOR PER MEDICINE  WATCH HG IF GOES BELOW 9 WITH OR WITHOUT SYMPTOMS WILL TRANSFUSE BASED ON CAD HISTORY AND AGE

## 2015-03-16 NOTE — Anesthesia Postprocedure Evaluation (Signed)
  Anesthesia Post-op Note  Patient: [name]  Procedure(s) Performed: Procedure(s): OPEN TREATMENT INTERNAL FIXATION WITH INTRAMEDULLARY  NAIL RIGHT HIP (Right)  Patient Location: ICU  Anesthesia Type:General  Level of Consciousness: awake, alert  and patient cooperative  Airway and Oxygen Therapy: Patient Spontanous Breathing  Post-op Pain: mild  Post-op Assessment: Post-op Vital signs reviewed, Patient's Cardiovascular Status Stable, Respiratory Function Stable, Patent Airway and No signs of Nausea or vomiting LLE Motor Response: Purposeful movement   RLE Motor Response: Purposeful movement        Post-op Vital Signs: Reviewed and stable  Last Vitals:  Filed Vitals:   03/16/15 0800  BP: 135/57  Pulse: 78  Temp:   Resp: 26    Complications: No apparent anesthesia complications

## 2015-03-16 NOTE — Progress Notes (Signed)
Physical Therapy Treatment Patient Details Name: [name] MRN: [id number] DOB: Jul 03, 1929 Today's Date: 03/16/2015    History of Present Illness Pt is an 81 y.o. male with hx of HTN, total blindness, 5.6 cm stable AAA (poor surgical candidate, followed by Dr Kellie Simmering), prior CVA, anxiety, depression, lives at home with his son, GERD, known CAD with CABG in the 90's, with no DOE or angina symptoms, tripped, fell, and suffered a comminuted Fx of the right hip.  His Cr is normal and Hb was at 12.6.  Dr Aline Brochure was consulted and will see him.  Hospitalist was asked to admit him for same.      PT Comments    Pt tolerated sitting in a recliner well but is fatigued.  He was instructed in standing from a chair to a walker and was then able to slowly turn to the bed.  He needed total assist to transfer sit to supine.  Pt was too fatigued to do any exercise.  He is making excellent progress.  Follow Up Recommendations  SNF     Equipment Recommendations  None recommended by PT    Recommendations for Other Services OT consult     Precautions / Restrictions Precautions Precautions: Fall Precaution Comments: pt is totally bilind Restrictions Weight Bearing Restrictions: Yes RLE Weight Bearing: Weight bearing as tolerated    Mobility  Bed Mobility Overal bed mobility: Needs Assistance Bed Mobility: Sit to Supine     Supine to sit: Max assist;+2 for physical assistance;HOB elevated Sit to supine: +2 for physical assistance;Total assist      Transfers Overall transfer level: Needs assistance Equipment used: Rolling walker (2 wheeled) Transfers: Sit to/from [facility] Sit to Stand: Min assist Stand pivot transfers: Min assist Squat pivot transfers: Mod assist     General transfer comment: pt was able to stand with a walker, then able to slowly turn to sit at edge of bed  Ambulation/Gait             General Gait Details: unable yet to ambulate                     Balance Overall balance assessment: Needs assistance Sitting-balance support: No upper extremity supported;Feet supported Sitting balance-Leahy Scale: Good     Standing balance support: Bilateral upper extremity supported Standing balance-Leahy Scale: Fair                      Cognition Arousal/Alertness: Awake/alert Behavior During Therapy: WFL for tasks assessed/performed Overall Cognitive Status: Within Functional Limits for tasks assessed                      Exercises General Exercises - Lower Extremity Ankle Circles/Pumps: AROM;Both;10 reps;Supine Quad Sets: AROM;Both;10 reps;Supine Gluteal Sets: AROM;Both;10 reps;Supine Short Arc Quad: AAROM;Right;10 reps;Supine Heel Slides: AAROM;Right;10 reps;Supine            Pertinent Vitals/Pain Pain Assessment: No/denies pain    Home Living Family/patient expects to be discharged to:: Skilled nursing facility Living Arrangements: Children             Additional Comments: PTA pt lived with son; daughter came by daily to check on pt    Prior Function Level of Independence: Independent with assistive device(s)      Comments: ambulated with a walker   PT Goals (current goals can now be found in the care plan section) Acute Rehab PT Goals Patient Stated Goal: none stated  PT Goal Formulation: With patient Time For Goal Achievement: 03/30/15 Potential to Achieve Goals: Good Progress towards PT goals: Progressing toward goals    Frequency  7X/week    PT Plan Current plan remains appropriate    Co-evaluation PT/OT/SLP Co-Evaluation/Treatment: Yes Reason for Co-Treatment: Complexity of the patient's impairments (multi-system involvement);For patient/therapist safety PT goals addressed during session: Mobility/safety with mobility       End of Session Equipment Utilized During Treatment: Gait belt Activity Tolerance: Patient limited by fatigue Patient left: in bed;with call  bell/phone within reach;with nursing/sitter in room     Time: 3716-9678 PT Time Calculation (min) (ACUTE ONLY): 33 min  Charges:  $Therapeutic Exercise: 8-22 mins $Therapeutic Activity: 8-22 mins                    G CodesSable Feil  PT 03/16/2015, 12:00 PM [phone]

## 2015-03-16 NOTE — Evaluation (Signed)
Physical Therapy Evaluation Patient Details Name: [name] MRN: [id number] DOB: 1928/09/12 Today's Date: 03/16/2015   History of Present Illness  [name] is an 81 y.o. male with hx of HTN, total blindness, 5.6 cm stable AAA (poor surgical candidate, followed by Dr Kellie Simmering), prior CVA, anxiety, depression, lives at home with his son, GERD, known CAD with CABG in the 90's, with no DOE or angina symptoms, tripped, fell, and suffered a comminuted Fx of the right hip.  His Cr is normal and Hb was at 12.6.  Dr Aline Brochure was consulted and will see him.  Hospitalist was asked to admit him for same.  He underwent OTIF of the right hip 03-15-15.  Clinical Impression   Pt was seen for initial eval/tx.  He was alert and oriented, very pleasant and cooperative.  Pt is totally blind but hearing is good.  He has the expected weakness/decreased ROM of the right hip with associated pain.  He tolerated therapeutic exercise fairly well but we did not complete all exercise due to increased pain (he was premedicated for pain and muscle spasm).  He was able to follow all directions well and once seated at the EOB he was then able to do a squat pivot transfer to chair with only mod assist.  Pt should progress with rehab very well and attain his prior functional level.  Will need SNF and he is agreeable.    Follow Up Recommendations SNF    Equipment Recommendations  None recommended by PT    Recommendations for Other Services OT consult     Precautions / Restrictions Precautions Precautions: Fall Precaution Comments: pt is totally bilind Restrictions Weight Bearing Restrictions: Yes RLE Weight Bearing: Weight bearing as tolerated      Mobility  Bed Mobility Overal bed mobility: Needs Assistance Bed Mobility: Supine to Sit     Supine to sit: Max assist;+2 for physical assistance;HOB elevated        Transfers Overall transfer level: Needs assistance Equipment used: 1 person hand held  assist Transfers: Squat Pivot Transfers     Squat pivot transfers: Mod assist     General transfer comment: pt needed max verbal cues due to total blindness  Ambulation/Gait    unable                                 Balance Overall balance assessment:  (good sitting balance)                                           Pertinent Vitals/Pain Pain Assessment: No/denies pain (at rest)    Home Living Family/patient expects to be discharged to:: Skilled nursing facility                      Prior Function Level of Independence: Independent with assistive device(s)         Comments: ambulated with a walker             Extremity/Trunk Assessment               Lower Extremity Assessment: RLE deficits/detail RLE Deficits / Details: strength and ROM limited due to recent surgery but pt is able to initiate hip flexion        Communication   Communication: No difficulties  Cognition  Arousal/Alertness: Awake/alert Behavior During Therapy: WFL for tasks assessed/performed Overall Cognitive Status: Within Functional Limits for tasks assessed                            Exercises General Exercises - Lower Extremity Ankle Circles/Pumps: AROM;Both;10 reps;Supine Quad Sets: AROM;Both;10 reps;Supine Gluteal Sets: AROM;Both;10 reps;Supine Short Arc Quad: AAROM;Right;10 reps;Supine Heel Slides: AAROM;Right;10 reps;Supine      Assessment/Plan    PT Assessment Patient needs continued PT services  PT Diagnosis Difficulty walking;Generalized weakness;Acute pain   PT Problem List Decreased strength;Decreased range of motion;Decreased activity tolerance;Decreased mobility;Decreased safety awareness;Cardiopulmonary status limiting activity;Pain  PT Treatment Interventions DME instruction;Gait training;Functional mobility training;Therapeutic exercise;Patient/family education   PT Goals (Current goals can be found in the  Care Plan section) Acute Rehab PT Goals Patient Stated Goal: none stated PT Goal Formulation: With patient Time For Goal Achievement: 03/30/15 Potential to Achieve Goals: Good    Frequency 7X/week   Barriers to discharge   none    Co-evaluation PT/OT/SLP Co-Evaluation/Treatment: Yes Reason for Co-Treatment: Complexity of the patient's impairments (multi-system involvement);For patient/therapist safety PT goals addressed during session: Mobility/safety with mobility         End of Session Equipment Utilized During Treatment: Gait belt Activity Tolerance: Patient tolerated treatment well;Patient limited by fatigue Patient left: in chair;with call bell/phone within reach;with family/visitor present Nurse Communication: Mobility status         Time: 0825-0900 PT Time Calculation (min) (ACUTE ONLY): 35 min   Charges:   PT Evaluation $Initial PT Evaluation Tier I: 1 Procedure PT Treatments $Therapeutic Exercise: 8-22 mins   PT G CodesSable Feil  PT 03/16/2015, 9:19 AM [phone]

## 2015-03-16 NOTE — Evaluation (Signed)
Occupational Therapy Evaluation Patient Details Name: [name] MRN: [id number] DOB: Jan 29, 1929 Today's Date: 03/16/2015    History of Present Illness Pt is an 81 y.o. male with hx of HTN, total blindness, 5.6 cm stable AAA (poor surgical candidate, followed by Dr Kellie Simmering), prior CVA, anxiety, depression, lives at home with his son, GERD, known CAD with CABG in the 90's, with no DOE or angina symptoms, tripped, fell, and suffered a comminuted Fx of the right hip.  His Cr is normal and Hb was at 12.6.  Dr Aline Brochure was consulted and will see him.  Hospitalist was asked to admit him for same.     Clinical Impression   Pt seen for evaluation this am, pt was alert and oriented. PTA pt living at home with son, son or daughter present at all times. Pt is totally blind and uses hand-held assist for functional mobility tasks at home. Pt and pt son report pt is independent in ADL tasks at home, with set-up for environment and equipment placement. Pt demonstrates good BUE range of motion and strength. Pt is pain limited in RLE and will most likely require assistance in immediate future for LB dressing and bathing tasks. Pt is agreeable to SNF on discharge, recommend OT evaluation at SNF. No further acute OT services required at this time.     Follow Up Recommendations  SNF;Supervision/Assistance - 24 hour    Equipment Recommendations  Tub/shower seat       Precautions / Restrictions Precautions Precautions: Fall Precaution Comments: pt is totally bilind Restrictions Weight Bearing Restrictions: Yes RLE Weight Bearing: Weight bearing as tolerated      Mobility Bed Mobility Overal bed mobility: Needs Assistance Bed Mobility: Supine to Sit     Supine to sit: Max assist;+2 for physical assistance;HOB elevated        Transfers Overall transfer level: Needs assistance Equipment used: 1 person hand held assist Transfers: Squat Pivot Transfers     Squat pivot transfers: Mod assist      General transfer comment: pt needed max verbal cues due to total blindness    Balance Overall balance assessment:  (good sitting balance)                                          ADL Overall ADL's : Needs assistance/impaired Eating/Feeding: Set up (cuing for set-up due to blindness)                                     General ADL Comments: Pt and son report independence with BADL tasks. Son helps with navigating environment such as getting in & out of tub, set-up of equipment. Pt will require assistance with LB dressing & bathing tasks in the immediate future due to increased pain in RLE.                Pertinent Vitals/Pain Pain Assessment: No/denies pain (when at rest)     Hand Dominance Right   Extremity/Trunk Assessment Upper Extremity Assessment Upper Extremity Assessment: Overall WFL for tasks assessed   Lower Extremity Assessment Lower Extremity Assessment: Defer to PT evaluation RLE Deficits / Details: strength and ROM limited due to recent surgery but pt is able to initiate hip flexion  RLE: Unable to fully assess due to pain  Communication Communication Communication: No difficulties   Cognition Arousal/Alertness: Awake/alert Behavior During Therapy: WFL for tasks assessed/performed Overall Cognitive Status: Within Functional Limits for tasks assessed                                Home Living Family/patient expects to be discharged to:: Skilled nursing facility Living Arrangements: Children                               Additional Comments: PTA pt lived with son; daughter came by daily to check on pt      Prior Functioning/Environment Level of Independence: Independent with assistive device(s)        Comments: ambulated with a walker    OT Diagnosis: Acute pain   OT Problem List: Pain      OT Goals(Current goals can be found in the care plan section) Acute Rehab OT  Goals Patient Stated Goal: none stated  OT Frequency:             Co-evaluation   Reason for Co-Treatment: Complexity of the patient's impairments (multi-system involvement);For patient/therapist safety PT goals addressed during session: Mobility/safety with mobility        End of Session Equipment Utilized During Treatment: Gait belt  Activity Tolerance: Patient limited by pain Patient left: in chair;with call bell/phone within reach   Time: 0825-0900 OT Time Calculation (min): 35 min Charges:  OT General Charges $OT Visit: 1 Procedure OT Evaluation $Initial OT Evaluation Tier I: 1 Procedure  Guadelupe Sabin, OTR/L  [phone]  03/16/2015, 9:25 AM

## 2015-03-16 NOTE — Progress Notes (Signed)
Subjective: The patient is alert this morning had a comfortable night. He was admitted following a fall at home and had intertrochanteric fracture right hip. He was taken to surgery yesterday and tolerated it well.  Objective: Vital signs in last 24 hours: Temp:  [98.2 F (36.8 C)-98.8 F (37.1 C)] 98.6 F (37 C) (09/06 0400) Pulse Rate:  [61-105] 61 (09/06 0600) Resp:  [14-32] 32 (09/06 0600) BP: (97-178)/(35-113) 129/56 mmHg (09/06 0600) SpO2:  [94 %-100 %] 99 % (09/06 0600) Weight:  [65.2 kg (143 lb 11.8 oz)] 65.2 kg (143 lb 11.8 oz) (09/06 0500) Weight change: -0.572 kg (-1 lb 4.2 oz) Last BM Date:  (Pt is unsure of last bowel movement)  Intake/Output from previous day: 09/05 0701 - 09/06 0700 In: 3545.7 [P.O.:480; I.V.:2354.7; Blood:201; IV Piggyback:510] Out: 850 [Urine:750; Blood:100] Intake/Output this shift: Total I/O In: 2244.7 [P.O.:480; I.V.:1354.7; IV Piggyback:410] Out: 600 [Urine:600]  Physical Exam: General appearance patient is alert and oriented  HEENT negative. Patient is totally blind  Neck supple no JVD or thyroid abnormalities  Heart regular rhythm no murmurs  Abdomen no palpable organs or masses  Tenderness at site of previous incisions right hip  Recent Labs  03/15/15 0545 03/16/15 0417  WBC 9.4 7.3  HGB 11.6* 9.2*  HCT 34.9* 27.0*  PLT 111* 81*   BMET  Recent Labs  03/15/15 0545 03/16/15 0417  NA 140 136  K 3.9 4.0  CL 106 104  CO2 27 26  GLUCOSE 112* 115*  BUN 11 13  CREATININE 0.83 0.78  CALCIUM 8.7* 7.8*    Studies/Results: Dg Chest 1 View  03/14/2015   CLINICAL DATA:  [place] this morning when trying to sit in a chair.  EXAM: CHEST  1 VIEW  COMPARISON:  05/13/2012.  FINDINGS: The heart remains normal in size. Stable post CABG changes. The lungs remain clear and hyperexpanded. Thoracolumbar spine degenerative changes. Interval gaseous distention of the stomach.  IMPRESSION: Stable changes of COPD.  Gaseous distention of the  stomach.   Electronically Signed   By: Claudie Revering M.D.   On: 03/14/2015 10:15   Dg Hip Operative Unilat With Pelvis Right  03/15/2015   CLINICAL DATA:  Fracture. IN nailing done. Fluoroscopy time reportedly 1 minutes 5 seconds.  EXAM: OPERATIVE RIGHT HIP (WITH PELVIS IF PERFORMED) 9 VIEWS  TECHNIQUE: Fluoroscopic spot image(s) were submitted for interpretation post-operatively.  COMPARISON:  03/14/2015  FINDINGS: Multiple images are performed during placement of IM nail and lag screw fixation. Alignment is near anatomic. No new fractures are identified. No dislocation on the images provided.  IMPRESSION: Status post ORIF right hip.  No adverse features identified.   Electronically Signed   By: Nolon Nations M.D.   On: 03/15/2015 14:24   Dg Hip Unilat With Pelvis 2-3 Views Right  03/14/2015   CLINICAL DATA:  [place] this morning trying to sit down on a chair. Pain in the right hip.  EXAM: DG HIP (WITH OR WITHOUT PELVIS) 2-3V RIGHT  COMPARISON:  08/07/2011  FINDINGS: There is a comminuted but minimally displaced fracture of the right hip. This appears to involve the basicervical region as well as intertrochanteric region.  The visualized portion of the pelvis is intact. Left hip is intact. Dense atherosclerotic calcification of the scratched a dense arterial calcifications.  IMPRESSION: 1. Comminuted fracture of the right hip involving the basicervical and intertrochanteric region. 2. No dislocation.   Electronically Signed   By: Nolon Nations M.D.   On: 03/14/2015  10:03   Ct Angio Abd/pel W/ And/or W/o  03/14/2015   CLINICAL DATA:  Followup abdominal aortic aneurysm.  EXAM: CTA ABDOMEN AND PELVIS WITHOUT AND WITH CONTRAST  TECHNIQUE: Multidetector CT imaging of the abdomen and pelvis was performed using the standard protocol during bolus administration of intravenous contrast. Multiplanar reconstructed images and MIPs were obtained and reviewed to evaluate the vascular anatomy.  CONTRAST:  133mL OMNIPAQUE  IOHEXOL 350 MG/ML SOLN  COMPARISON:  Previous examinations, the most recent dated 08/07/2011.  FINDINGS: Again demonstrated is a large infrarenal abdominal aortic aneurysm. This currently measures 6.9 cm in maximum diameter on image number 59. This previously measured 5.9 cm in maximum diameter. Extensive mural thrombus is again demonstrated. The iliac arteries are normal in caliber. There are extensive arterial calcifications. No retroperitoneal hemorrhage is seen.  The prostate gland is moderately enlarged. Mild diffuse bladder wall thickening. 4 mm mid left renal calculus. Lower pole left renal cyst. The previously demonstrated 1.9 cm medium to high density upper pole left renal mass measures 2.0 cm in maximum diameter on image number 25 and 73 Hounsfield units in density, representing no significant change. 5 mm upper pole right renal calculus. No ureteral calculi or hydronephrosis on either side. No bladder calculi.  Unremarkable spleen, pancreas and right adrenal gland. Surgically absent gallbladder. A medium to low density left adrenal mass has not changed significantly, measuring 2.7 x 1.5 cm on image 19.  No gastrointestinal abnormalities or enlarged lymph nodes. No evidence of appendicitis. Clear lung bases. Lumbar and lower thoracic spine degenerative changes.  Review of the MIP images confirms the above findings.  IMPRESSION: 1. Interval increase in size of the infrarenal abdominal aortic aneurysm, currently measuring 6.9 cm in maximum diameter. Vascular surgery consultation recommended due to increased risk of rupture for AAA >5.5 cm. This recommendation follows ACR consensus guidelines: White Paper of the ACR Incidental Findings Committee II on Vascular Findings. J Am Coll Radiol 2013; 10:789-794. 2. Small, nonobstructing bilateral renal calculi. 3. Stable probable hemorrhagic or proteinaceous cyst in the upper pole of the left kidney. 4. Stable small lateral segment left lobe probable cyst. 5.  Moderately enlarged prostate gland without significant change. 6. Extensive atheromatous arterial calcifications. 7. Stable left adrenal probable adenoma.   Electronically Signed   By: Claudie Revering M.D.   On: 03/14/2015 16:54    Medications:  . acetaminophen  1,000 mg Intravenous Q6H  . atorvastatin  40 mg Oral q1800  . docusate sodium  100 mg Oral BID  . folic acid  5,361 mcg Oral Daily  . lisinopril  40 mg Oral Daily  . methocarbamol (ROBAXIN)  IV  500 mg Intravenous Q6H  . metoprolol succinate  50 mg Oral Daily  . multivitamin with minerals  1 tablet Oral Daily  . polyethylene glycol  17 g Oral Daily  . sodium chloride  3 mL Intravenous Q12H  . traMADol  50 mg Oral 4 times per day  . traMADol  50 mg Oral Once    . sodium chloride 100 mL/hr at 03/16/15 0600     Assessment/Plan: 1. Intertrochanteric fracture right hip 1 day postop  2. Known aortic aneurysm-being followed by vascular surgery in [place]-stable CT angiogram 6.9 cm-needs past surgical consult when appropriate   LOS: 2 days   Andria Head G 03/16/2015, 6:40 AM

## 2015-03-16 NOTE — Clinical Social Work Placement (Signed)
   CLINICAL SOCIAL WORK PLACEMENT  NOTE  Date:  03/16/2015  Patient Details  Name: [name] MRN: [id number] Date of Birth: 1929-06-13  Clinical Social Work is seeking post-discharge placement for this patient at the [place] level of care (*CSW will initial, date and re-position this form in  chart as items are completed):  Yes   Patient/family provided with [place] Work Department's list of facilities offering this level of care within the geographic area requested by the patient (or if unable, by the patient's family).  Yes   Patient/family informed of their freedom to choose among providers that offer the needed level of care, that participate in Medicare, Medicaid or managed care program needed by the patient, have an available bed and are willing to accept the patient.  Yes   Patient/family informed of [facility]'s ownership interest in [facility] and [facility], as well as of the fact that they are under no obligation to receive care at these facilities.  PASRR submitted to EDS on 03/16/15     PASRR number received on 03/16/15     Existing PASRR number confirmed on       FL2 transmitted to all facilities in geographic area requested by pt/family on 03/16/15     FL2 transmitted to all facilities within larger geographic area on       Patient informed that his/her managed care company has contracts with or will negotiate with certain facilities, including the following:            Patient/family informed of bed offers received.  Patient chooses bed at       Physician recommends and patient chooses bed at      Patient to be transferred to   on  .  Patient to be transferred to facility by       Patient family notified on   of transfer.  Name of family member notified:        PHYSICIAN       Additional Comment:    _______________________________________________ Salome Arnt, [place] 03/16/2015, 10:54  AM [phone]

## 2015-03-16 NOTE — Clinical Social Work Note (Signed)
Clinical Social Work Assessment  Patient Details  Name: [name] MRN: [id number] Date of Birth: 01/20/1929  Date of referral:  03/16/15               Reason for consult:  Facility Placement                Permission sought to share information with:  Family Supports Permission granted to share information::  Yes, Verbal Permission Granted  Name::     [name], [name]  Agency::     Relationship::  children  Contact Information:     Housing/Transportation Living arrangements for the past 2 months:  Single Family Home Source of Information:  Patient, Adult Children Patient Interpreter Needed:  None Criminal Activity/Legal Involvement Pertinent to Current Situation/Hospitalization:  No - Comment as needed Significant Relationships:  Adult Children Lives with:  Adult Children Do you feel safe going back to the place where you live?  No Need for family participation in patient care:  Yes (Comment)  Care giving concerns:  Pt will need SNF.    Social Worker assessment / plan:  CSW met with pt and pt's children, [name] and [name] at bedside. Pt resting and requested that CSW discuss d/c planning with his children. Pt did participate some and agreed to SNF. Children report that pt lives with [name] and has someone with him around the clock as pt is blind. He was fairly independent at baseline and family assisted with ambulating while pt used walker. He fell at home and is admitted with hip fracture. Surgery was yesterday and pt has worked with PT. Recommendation is for SNF. This was discussed with family and they are agreeable. They request PNC only at this point, but have SNF list to begin searching for other options if needed. Children share that they are only interested in short term rehab and plan will be for pt to return home. CSW will initiate bed search.   Employment status:  Retired Insurance information:  Managed Medicare PT Recommendations:  Skilled Nursing Facility Information /  Referral to community resources:  Skilled Nursing Facility  Patient/Family's Response to care:  Pt and family agreeable to short term SNF prior to return home.   Patient/Family's Understanding of and Emotional Response to Diagnosis, Current Treatment, and Prognosis:  Pt and family have good understanding of treatment plan, with SNF as part of that plan. They are somewhat concerned about pt adjusting to new environment at SNF as he is blind, but feel as long as pt is local they will be able to visit very frequently.   Emotional Assessment Appearance:  Appears stated age Attitude/Demeanor/Rapport:  Other (pt resting) Affect (typically observed):  Accepting Orientation:  Oriented to Self, Oriented to Place, Oriented to  Time, Oriented to Situation Alcohol / Substance use:  Not Applicable Psych involvement (Current and /or in the community):  No (Comment)  Discharge Needs  Concerns to be addressed:  Discharge Planning Concerns Readmission within the last 30 days:  No Current discharge risk:  Physical Impairment Barriers to Discharge:  Continued Medical Work up   Stultz, Kara [place], LCSW 03/16/2015, 11:04 AM [phone] 

## 2015-03-16 NOTE — Clinical Documentation Improvement (Signed)
Family Medicine Internal Medicine Orthopedic  Abnormal Lab/Test Results: #1 of 2:  Component      RBC Hemoglobin HCT  Latest Ref Rng      4.22 - 5.81 MIL/uL 13.0 - 17.0 g/dL 39.0 - 52.0 %  03/14/2015     10:24 AM 3.80 (L) 12.6 (L) 37.3 (L)  03/15/2015      3.49 (L) 11.6 (L) 34.9 (L)  03/16/2015      2.73 (L) 9.2 (L) 27.0 (L)    Possible Clinical Conditions associated with below indicators  Anemia   Acuity - acute, chronic, acute on chronic  Type - blood loss, iron deficiency, etc.  Other Condition  Cannot Clinically Determine  Abnormal Lab/Test Results: #2 of 2:  Component      Platelets  Latest Ref Rng      150 - 400 K/uL  03/14/2015     10:24 AM 108 (L)  03/15/2015      111 (L)  03/16/2015      81 (L)    Thrombocytopenia  Other  Unable to determine at present  Please exercise your independent, professional judgment when responding. A specific answer is not anticipated or expected.  Thank you, Mateo Flow, RN [phone] Clinical Documentation Specialist

## 2015-03-17 ENCOUNTER — Encounter (HOSPITAL_COMMUNITY): Payer: Self-pay | Admitting: Orthopedic Surgery

## 2015-03-17 LAB — BASIC METABOLIC PANEL
ANION GAP: 4 — AB (ref 5–15)
Anion gap: 7 (ref 5–15)
BUN: 13 mg/dL (ref 6–20)
BUN: 10 mg/dL (ref 6–20)
CHLORIDE: 106 mmol/L (ref 101–111)
CO2: 26 mmol/L (ref 22–32)
CO2: 22 mmol/L (ref 22–32)
Calcium: 7.9 mg/dL — ABNORMAL LOW (ref 8.9–10.3)
Calcium: 7.7 mg/dL — ABNORMAL LOW (ref 8.9–10.3)
Chloride: 106 mmol/L (ref 101–111)
Creatinine, Ser: 0.83 mg/dL (ref 0.61–1.24)
Creatinine, Ser: 0.83 mg/dL (ref 0.61–1.24)
GFR calc Af Amer: >60 mL/min (ref >60)
GFR calc non Af Amer: >60 mL/min (ref >60)
GFR calc non Af Amer: >60 mL/min (ref >60)
GLUCOSE: 110 mg/dL — AB (ref 65–99)
Glucose, Bld: 113 mg/dL — ABNORMAL HIGH (ref 65–99)
Potassium: 4.1 mmol/L (ref 3.5–5.1)
Potassium: 3.8 mmol/L (ref 3.5–5.1)
Sodium: 136 mmol/L (ref 135–145)
Sodium: 135 mmol/L (ref 135–145)

## 2015-03-17 LAB — CBC
HEMATOCRIT: 27.5 % — AB (ref 39.0–52.0)
HEMOGLOBIN: 9.3 g/dL — AB (ref 13.0–17.0)
MCH: 33.6 pg (ref 26.0–34.0)
MCHC: 33.8 g/dL (ref 30.0–36.0)
MCV: 99.3 fL (ref 78.0–100.0)
Platelets: 88 10*3/uL — ABNORMAL LOW (ref 150–400)
RBC: 2.77 MIL/uL — ABNORMAL LOW (ref 4.22–5.81)
RDW: 13.6 % (ref 11.5–15.5)
WBC: 6.9 10*3/uL (ref 4.0–10.5)

## 2015-03-17 LAB — TROPONIN I: Troponin I: 0.03 ng/mL (ref <0.031)

## 2015-03-17 LAB — MAGNESIUM: Magnesium: 1.7 mg/dL (ref 1.7–2.4)

## 2015-03-17 MED ORDER — DILTIAZEM HCL 100 MG IV SOLR
5.0000 mg/h | INTRAVENOUS | Status: DC
  Administered 2015-03-17: 5 mg/h via INTRAVENOUS
  Filled 2015-03-17: qty 100

## 2015-03-17 MED ORDER — DILTIAZEM HCL 25 MG/5ML IV SOLN
10.0000 mg | INTRAVENOUS | Status: AC
  Administered 2015-03-17: 10 mg via INTRAVENOUS

## 2015-03-17 MED ORDER — METHOCARBAMOL 500 MG PO TABS
500.0000 mg | ORAL_TABLET | Freq: Four times a day (QID) | ORAL | Status: DC
  Administered 2015-03-17 – 2015-03-24 (×26): 500 mg via ORAL
  Filled 2015-03-17 (×26): qty 1

## 2015-03-17 MED ORDER — DILTIAZEM HCL 25 MG/5ML IV SOLN
INTRAVENOUS | Status: AC
  Administered 2015-03-17: 10 mg via INTRAVENOUS
  Filled 2015-03-17: qty 5

## 2015-03-17 NOTE — Clinical Social Work Placement (Signed)
   CLINICAL SOCIAL WORK PLACEMENT  NOTE  Date:  03/17/2015  Patient Details  Name: [name] MRN: [id number] Date of Birth: 14-Feb-1929  Clinical Social Work is seeking post-discharge placement for this patient at the [place] level of care (*CSW will initial, date and re-position this form in  chart as items are completed):  Yes   Patient/family provided with [place] Work Department's list of facilities offering this level of care within the geographic area requested by the patient (or if unable, by the patient's family).  Yes   Patient/family informed of their freedom to choose among providers that offer the needed level of care, that participate in Medicare, Medicaid or managed care program needed by the patient, have an available bed and are willing to accept the patient.  Yes   Patient/family informed of [facility]'s ownership interest in [facility] and [facility], as well as of the fact that they are under no obligation to receive care at these facilities.  PASRR submitted to EDS on 03/16/15     PASRR number received on 03/16/15     Existing PASRR number confirmed on       FL2 transmitted to all facilities in geographic area requested by pt/family on 03/16/15     FL2 transmitted to all facilities within larger geographic area on       Patient informed that his/her managed care company has contracts with or will negotiate with certain facilities, including the following:        Yes   Patient/family informed of bed offers received.  Patient [facility] bed at [facility]     Physician recommends and patient chooses bed at      Patient to be transferred to   on  .  Patient to be transferred to facility by       Patient family notified on   of transfer.  Name of family member notified:        PHYSICIAN       Additional Comment:    _______________________________________________ Salome Arnt,  [place] 03/17/2015, 9:17 AM [phone]

## 2015-03-17 NOTE — Progress Notes (Signed)
Subjective: The patient is alert and oriented. He is 2 days postop following surgery for fractured right heel. This is intertrochanteric fracture. He seems to be having minimal pain  Objective: Vital signs in last 24 hours: Temp:  [97.1 F (36.2 C)-99.3 F (37.4 C)] 97.1 F (36.2 C) (09/07 0505) Pulse Rate:  [72-90] 90 (09/07 0505) Resp:  [20-26] 20 (09/07 0505) BP: (135-160)/(57-83) 160/75 mmHg (09/07 0505) SpO2:  [91 %-99 %] 95 % (09/07 0505) Weight change:  Last BM Date:  (unknown/pt can not remember)  Intake/Output from previous day: 09/06 0701 - 09/07 0700 In: 2605 [P.O.:240; I.V.:2100; IV Piggyback:265] Out: 550 [Urine:550] Intake/Output this shift: Total I/O In: 2110 [I.V.:2000; IV Piggyback:110] Out: 300 [Urine:300]  Physical Exam: General appearance patient is alert and oriented  HEENT negative except for the fact the patient's totally blind  Heart regular rhythm no murmurs  Abdomen no palpable organs or masses  Tenderness at site of incision right hip   Recent Labs  03/15/15 0545 03/16/15 0417  WBC 9.4 7.3  HGB 11.6* 9.2*  HCT 34.9* 27.0*  PLT 111* 81*   BMET  Recent Labs  03/15/15 0545 03/16/15 0417  NA 140 136  K 3.9 4.0  CL 106 104  CO2 27 26  GLUCOSE 112* 115*  BUN 11 13  CREATININE 0.83 0.78  CALCIUM 8.7* 7.8*    Studies/Results: Dg Hip Operative Unilat With Pelvis Right  03/15/2015   CLINICAL DATA:  Fracture. IN nailing done. Fluoroscopy time reportedly 1 minutes 5 seconds.  EXAM: OPERATIVE RIGHT HIP (WITH PELVIS IF PERFORMED) 9 VIEWS  TECHNIQUE: Fluoroscopic spot image(s) were submitted for interpretation post-operatively.  COMPARISON:  03/14/2015  FINDINGS: Multiple images are performed during placement of IM nail and lag screw fixation. Alignment is near anatomic. No new fractures are identified. No dislocation on the images provided.  IMPRESSION: Status post ORIF right hip.  No adverse features identified.   Electronically Signed    By: Nolon Nations M.D.   On: 03/15/2015 14:24    Medications:  . atorvastatin  40 mg Oral q1800  . docusate sodium  100 mg Oral BID  . folic acid  4,098 mcg Oral Daily  . lisinopril  40 mg Oral Daily  . methocarbamol (ROBAXIN)  IV  500 mg Intravenous Q6H  . metoprolol succinate  50 mg Oral Daily  . multivitamin with minerals  1 tablet Oral Daily  . polyethylene glycol  17 g Oral Daily  . sodium chloride  3 mL Intravenous Q12H  . traMADol  50 mg Oral 4 times per day  . traMADol  50 mg Oral Once    . sodium chloride 100 mL/hr at 03/16/15 2342     Assessment/Plan: 1. Intertrochanteric fracture of right hip-2 days postop-continue with physical therapy  2. Known aortic aneurysm being followed by vascular surgery in [place] stable but 6.9 cm-vascular surgery consult when appropriate   LOS: 3 days   Wei Newbrough G 03/17/2015, 6:47 AM

## 2015-03-17 NOTE — Progress Notes (Signed)
Pt has converted to A.fib with a HR of 161. Pt states he doesn't feel well and is dry heaving with one episode of vomiting. BP 167/94. Paged Dr.Hawkins. There are no new orders will continue to monitor

## 2015-03-17 NOTE — Progress Notes (Signed)
Spoke with Dr.Hawkins who gave orders to administer one 10 mg dose of cardizem Iv and send pt to step down. Will administer and continue to monitor

## 2015-03-17 NOTE — Progress Notes (Signed)
Physical Therapy Treatment Patient Details Name: [name] MRN: [id number] DOB: 11-04-1928 Today's Date: 03/17/2015    History of Present Illness Pt is an 81 y.o. male with hx of HTN, total blindness, 5.6 cm stable AAA (poor surgical candidate, followed by [name]), prior CVA, anxiety, depression, lives at home with his son, GERD, known CAD with CABG in the 90's, with no DOE or angina symptoms, tripped, fell, and suffered a comminuted Fx of the right hip.  His Cr is normal and Hb was at 12.6.  [name] was consulted and will see him.  Hospitalist was asked to admit him for same.      PT Comments    Pt tolerating treatment session well, motivated and able to complete entire PT sesssion as planned, however continues to have right much difficulty with pain control. Pt continues to make progress toward goals as evidenced by improved tolerance and strength with HEP. Pt's greatest limitation continues to be painfully limited ROM, which continues to limit ability to perform weight bearing and ambulation at baseline function. Patient presenting with impairment of strength, pain, range of motion, balance, and activity tolerance, limiting ability to perform ADL and mobility tasks at  baseline level of function. Patient will benefit from skilled intervention to address the above impairments and limitations, in order to restore to prior level of function, improve patient safety upon discharge, and to decrease caregiver burden.    Follow Up Recommendations  SNF     Equipment Recommendations  None recommended by PT    Recommendations for Other Services OT consult     Precautions / Restrictions Precautions Precautions: Fall Precaution Comments: pt is totally bilind Restrictions Weight Bearing Restrictions: Yes RLE Weight Bearing: Weight bearing as tolerated    Mobility  Bed Mobility Overal bed mobility: Needs Assistance;+2 for physical assistance Bed Mobility: Sit to Supine;Supine to Sit      Supine to sit: Mod assist (Pain inhibition and trunk/BUE weakness, unable to pull self up. ) Sit to supine: Total assist (scooting in bed, TotalA +2)      Transfers Overall transfer level: Needs assistance Equipment used: Rolling walker (2 wheeled) Transfers: Sit to/from Stand Sit to Stand: From elevated surface;Min assist         General transfer comment: very painful, able to stand on bent knees for 45-60 seconds, requires a knee block on L to prevent knee buckling. Unable to stand up straight and errect.   Ambulation/Gait             General Gait Details: Unable   Stairs            Wheelchair Mobility    Modified Rankin (Stroke Patients Only)       Balance   Sitting-balance support: Single extremity supported;Feet supported;Feet unsupported Sitting balance-Leahy Scale: Fair Sitting balance - Comments: Pt is very anxious about falling.                             Cognition Arousal/Alertness: Awake/alert Behavior During Therapy: WFL for tasks assessed/performed Overall Cognitive Status: Within Functional Limits for tasks assessed                      Exercises General Exercises - Lower Extremity Ankle Circles/Pumps: AROM;Supine;Right;20 reps;Strengthening Gluteal Sets: AROM;Supine;Right;Strengthening;15 reps (3second hold) Short Arc QuadSinclair [name];Right;Supine;15 reps Heel Slides: AAROM;Right;Supine;15 reps Hip ABduction/ADduction: AAROM;Right;15 reps;Supine    General Comments  Pertinent Vitals/Pain Pain Assessment: 0-10 Pain Score: 10-Worst pain ever Pain Location: R hip, posterior Pain Intervention(s): Limited activity within patient's tolerance;Monitored during session;Premedicated before session;Repositioned;Utilized relaxation techniques    Home Living                      Prior Function            PT Goals (current goals can now be found in the care plan section) Acute Rehab PT Goals Patient  Stated Goal: Pt doesnt want to fall anymore, ever again.  PT Goal Formulation: With patient Time For Goal Achievement: 03/30/15 Potential to Achieve Goals: Good Progress towards PT goals: Progressing toward goals    Frequency  7X/week    PT Plan Current plan remains appropriate    Co-evaluation     PT goals addressed during session: Mobility/safety with mobility;Strengthening/ROM;Other (comment)       End of Session Equipment Utilized During Treatment: Gait belt Activity Tolerance: Patient limited by pain;Patient tolerated treatment well Patient left: in bed;with bed alarm set;with family/visitor present;with call bell/phone within reach     Time: 1010-1043 PT Time Calculation (min) (ACUTE ONLY): 33 min  Charges:  $Therapeutic Exercise: 8-22 mins $Therapeutic Activity: 8-22 mins                    G Codes:      [name] C 2015-03-20, 11:00 AM  11:01 AM  [name], PT, DPT [place] License # [id number]

## 2015-03-18 ENCOUNTER — Inpatient Hospital Stay (HOSPITAL_COMMUNITY): Payer: Medicare Other

## 2015-03-18 ENCOUNTER — Encounter (HOSPITAL_COMMUNITY): Payer: Self-pay | Admitting: *Deleted

## 2015-03-18 DIAGNOSIS — I714 Abdominal aortic aneurysm, without rupture: Secondary | ICD-10-CM

## 2015-03-18 DIAGNOSIS — H540 Blindness, both eyes: Secondary | ICD-10-CM

## 2015-03-18 DIAGNOSIS — I4891 Unspecified atrial fibrillation: Secondary | ICD-10-CM

## 2015-03-18 LAB — TYPE AND SCREEN
ABO/RH(D): O POS
Antibody Screen: NEGATIVE
UNIT DIVISION: 0
UNIT DIVISION: 0
Unit division: 0
Unit division: 0

## 2015-03-18 LAB — CBC
HCT: 25.6 % — ABNORMAL LOW (ref 39.0–52.0)
HEMOGLOBIN: 8.8 g/dL — AB (ref 13.0–17.0)
MCH: 33.8 pg (ref 26.0–34.0)
MCHC: 34.4 g/dL (ref 30.0–36.0)
MCV: 98.5 fL (ref 78.0–100.0)
PLATELETS: 75 10*3/uL — AB (ref 150–400)
RBC: 2.6 MIL/uL — AB (ref 4.22–5.81)
RDW: 13.6 % (ref 11.5–15.5)
WBC: 6.1 10*3/uL (ref 4.0–10.5)

## 2015-03-18 LAB — TROPONIN I: TROPONIN I: 0.04 ng/mL — AB (ref <0.031)

## 2015-03-18 MED ORDER — DILTIAZEM HCL 60 MG PO TABS
60.0000 mg | ORAL_TABLET | Freq: Two times a day (BID) | ORAL | Status: DC
  Administered 2015-03-18: 60 mg via ORAL
  Filled 2015-03-18: qty 1

## 2015-03-18 MED ORDER — DILTIAZEM HCL 30 MG PO TABS
30.0000 mg | ORAL_TABLET | Freq: Three times a day (TID) | ORAL | Status: DC
Start: 2015-03-18 — End: 2015-03-23
  Administered 2015-03-18 – 2015-03-22 (×14): 30 mg via ORAL
  Filled 2015-03-18 (×14): qty 1

## 2015-03-18 NOTE — Progress Notes (Signed)
PT Cancellation Note  Patient Details Name: [name] MRN: [id number] DOB: 03-25-1929   Cancelled Treatment:    Reason Eval/Treat Not Completed: Medical issues which prohibited therapy.  Pt is now in the ICU due to episode of Afib.  He is still on Cardizem drip.  We will hold PT until medical stability is reached and cleared by MD.   Demetrios Isaacs L 03/18/2015, 10:30 AM

## 2015-03-18 NOTE — Consult Note (Signed)
CARDIOLOGY CONSULT NOTE   Patient ID: [name] MRN: [id number] DOB/AGE: [birth year] 79 y.o.  Admit Date: 03/14/2015 Referring Physician: [name] MD Primary Physician: Lanette Hampshire, MD Consulting Cardiologist: Rozann Lesches MD Primary Cardiologist : Formerly Dr. Lattie Haw Reason for Consultation: Afib with RVR   Clinical Summary Mr. Miklas is an 81 y.o.male with a historyof hypertension, dyslipidemia, coronary artery disease status post coronary artery bypass grafting (1998 with a LIMA to the LAD, saphenousvein graft to the obtuse marginal and saphenous vein graft to the posterior descending branch), PAD with carotid artery disease, AAA (not a surgical candidate), femoral artery disease, hypertension and hyperlipidemia   He was admitted after tripping and falling sustaining a comminuted right hip fx. He had surgical repair by Dr. Aline Brochure on 03/15/2015.  He had episode of atrial fibrillation with RVR on 03/17/2015 while recovering from surgery and had been undergoing PT. He has symptoms of nausea vomiting and dry heaving. He was transferred to ICU and given IV diltiazem. EKG demonstrated atrial fibrillation with RVR, rate of 149 bpm and frequent PVC's. He is now on diltiazem gtt at 5 mg hour, with controlled HR in the 70's and 80's.   Review of labs demonstrates troponin of 0.03 and 0.4 respectively. He is anemic with Hgb of 8.8 decreased from 9.3, No leukocytosis. No recent CXR.    Allergies  Allergen Reactions  . Codeine Hives  . Penicillins Hives    Medications Scheduled Medications: . atorvastatin  40 mg Oral q1800  . diltiazem  60 mg Oral Q12H  . docusate sodium  100 mg Oral BID  . folic acid  4,665 mcg Oral Daily  . lisinopril  40 mg Oral Daily  . methocarbamol  500 mg Oral QID  . metoprolol succinate  50 mg Oral Daily  . multivitamin with minerals  1 tablet Oral Daily  . polyethylene glycol  17 g Oral Daily  . sodium chloride  3 mL Intravenous Q12H  .  traMADol  50 mg Oral 4 times per day  . traMADol  50 mg Oral Once    Infusions: . sodium chloride 100 mL/hr at 03/18/15 0612    PRN Medications: alum & mag hydroxide-simeth, bisacodyl, HYDROmorphone (DILAUDID) injection, ipratropium-albuterol, labetalol, magnesium citrate, menthol-cetylpyridinium **OR** phenol, metoCLOPramide **OR** metoCLOPramide (REGLAN) injection, ondansetron **OR** ondansetron (ZOFRAN) IV   Past Medical History  Diagnosis Date  . Essential hypertension   . COPD (chronic obstructive pulmonary disease)   . History of stroke   . Glaucoma   . BPH (benign prostatic hyperplasia)   . Anxiety   . Depression   . Blindness - both eyes     Secondary to glaucoma  . GERD (gastroesophageal reflux disease)   . Headache(784.0)   . Aortic aneurysm   . CAD (coronary artery disease)     Multivessel status post CABG 1998  . PAD (peripheral artery disease)   . Carotid disease, bilateral   . Femoral artery stenosis   . SVT (supraventricular tachycardia)     Past Surgical History  Procedure Laterality Date  . 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 Cataract extraction Left 2009  . Eye surgery      Corneal ruptured and removed eye completely  . Cholecystectomy  20+ yrs ago  . Carotid endarterectomy Right   . Transurethral resection of prostate    . Transurethral resection of prostate  08/23/2011    Procedure:  TRANSURETHRAL RESECTION OF THE PROSTATE (TURP);  Surgeon: Marissa Nestle, MD;  Location: [facility];  Service: Urology;  Laterality: N/A;  . Intramedullary (im) nail intertrochanteric Right 03/15/2015    Procedure: OPEN TREATMENT INTERNAL FIXATION WITH INTRAMEDULLARY  NAIL RIGHT HIP;  Surgeon: Carole Civil, MD;  Location: [facility];  Service: Orthopedics;  Laterality: Right;  . Coronary artery bypass graft      1998 with a LIMA to the LAD, saphenousvein graft to the obtuse marginal and saphenous vein graft to the  posterior descending branch    Family History  Problem Relation Age of Onset  . Anesthesia problems Neg Hx   . Hypotension Neg Hx   . Malignant hyperthermia Neg Hx   . Pseudochol deficiency Neg Hx   . Heart attack Brother     Deceased   . Heart attack Brother     Deceased    Social History Mr. Mozer reports that he has been smoking Cigarettes.  He has a 75 pack-year smoking history. He has never used smokeless tobacco. Mr. Schachter reports that he does not drink alcohol.  Review of Systems Complete review of systems are found to be negative unless outlined in H&P above.  Physical Examination Blood pressure 150/101, pulse 84, temperature 97.9 F (36.6 C), temperature source Oral, resp. rate 23, height 5\' 9"  (1.753 m), weight 151 lb 3.8 oz (68.6 kg), SpO2 99 %.  Intake/Output Summary (Last 24 hours) at 03/18/15 1107 Last data filed at 03/18/15 0500  Gross per 24 hour  Intake    360 ml  Output    700 ml  Net   -340 ml    Telemetry: Atrial fib rates in the 70's and 80's with occasional PVC's.   GEN: No acute distress.  HEENT: Conjunctiva and lids normal, oropharynx clear with moist mucosa. He is blind.  Neck: Supple, no elevated JVP or carotid bruits, no thyromegaly. Lungs: Bilateral rales and frequent coughing, non-productive.  Cardiac: Irregular rate and rhythm, no S3 or significant systolic murmur, no pericardial rub. Abdomen: Soft, nontender, no hepatomegaly, bowel sounds present, no guarding or rebound. Extremities: No pitting edema, distal pulses 2+.Dressing to right hip.  Skin: Warm and dry. Musculoskeletal: No kyphosis. Frail thin. Neuropsychiatric: Alert and oriented x3, affect grossly appropriate.   Echocardiogram 2010 Overall left ventricular systolic function was at the lower    limits of normal. Left ventricular ejection fraction was    estimated , range being 50 % to 55 %. There was mild diffuse    left ventricular hypokinesis. Left ventricular  wall thickness    was mildly increased. - Possibly bicuspid aortic valve. The aortic valve was mildly    calcified. There was trivial aortic valvular regurgitation. - There was mild fibrocalcific change of the aortic root. - The effective orifice of mitral regurgitation by proximal    isovelocity surface area was 0.06 cm^2. The volume of mitral    regurgitation by proximal isovelocity surface area was 11 cc. - Left atrial size was at the upper limits of normal.  CT of abdomen and pelvis 03/14/2015 IMPRESSION: 1. Interval increase in size of the infrarenal abdominal aortic aneurysm, currently measuring 6.9 cm in maximum diameter. Vascular surgery consultation recommended due to increased risk of rupture for AAA >5.5 cm. This recommendation follows ACR consensus guidelines: White Paper of the ACR Incidental Findings Committee II on Vascular Findings. J Am Coll Radiol 2013; 10:789-794. 2. Small, nonobstructing bilateral renal calculi. 3. Stable probable hemorrhagic or proteinaceous cyst in  the upper pole of the left kidney. 4. Stable small lateral segment left lobe probable cyst. 5. Moderately enlarged prostate gland without significant change. 6. Extensive atheromatous arterial calcifications. 7. Stable left adrenal probable adenoma.  Lab Results  Basic Metabolic Panel:  Recent Labs Lab 03/14/15 1024 03/15/15 0545 03/16/15 0417 03/17/15 0636 03/17/15 2116  NA 139 140 136 136 135  K 3.7 3.9 4.0 4.1 3.8  CL 107 106 104 106 106  CO2 28 27 26 26 22   GLUCOSE 120* 112* 115* 110* 113*  BUN 11 11 13 13 10   CREATININE 0.83 0.83 0.78 0.83 0.83  CALCIUM 8.6* 8.7* 7.8* 7.9* 7.7*  MG  --   --   --   --  1.7    Liver Function Tests:  Recent Labs Lab 03/15/15 0545  AST 118*  ALT 130*  ALKPHOS 135*  BILITOT 0.5  PROT 5.8*  ALBUMIN 3.5    CBC:  Recent Labs Lab 03/14/15 1024 03/15/15 0545 03/16/15 0417 03/17/15 0636 03/18/15 0242  WBC 9.7 9.4  7.3 6.9 6.1  NEUTROABS 8.7*  --   --   --   --   HGB 12.6* 11.6* 9.2* 9.3* 8.8*  HCT 37.3* 34.9* 27.0* 27.5* 25.6*  MCV 98.2 100.0 98.9 99.3 98.5  PLT 108* 111* 81* 88* 75*    Cardiac Enzymes:  Recent Labs Lab 03/17/15 2116 03/18/15 0242 03/18/15 0908  TROPONINI 0.03 0.04* <0.03    ECG: Atrial fib with RVR rate of 149 bpm with frequent PVC's.    Impression and Recommendations  1. Newly documented atrial fibrillation: This occurred post op day #2. No previous hx of atrial fibrillation. He is now rate controlled with IV diltiazem at 5 mg/hour. Will transition to PO and monitor response. CHADSVASC score is 6, although with history of falls, legal blindness, large abdominal aortic aneurysm that is nonoperable, anemia and thrombocytopenia, he is not a good candidate for anticoagulation. Check echocardiogram for changes in LV fx and to assist in medical management.   2. CAD: Hx of CABG (3 vessel). He remains on ACE, metoprolol, and atorvastatin.  3. AAA: Measuring 6.9 cm in maximum diameter. He is not a surgical candidate per [facility] - Dr. Kellie Simmering.  4. S/P Right Hip fx: Repaired on 9/5/ 2016. Defer to Dr. Aline Brochure.   5. COPD: Continues congestion. Now on abx therapy per Dr. Luan Pulling with inhaler therapy.   6. Hypertension: Currently well controlled and tolerating diltiazem, metoprolol, and lisinopril.    Signed: Phill Myron. Lawrence NP [place]  03/18/2015, 11:07 AM Co-Sign MD   Attending note:  Patient seen and examined. Reviewed records and modified above note by Ms. Lawrence NP. Mr. Creamer presents after a fall with resulting right intertrochanteric hip fracture. He underwent internal fixation with intramedullary nail by Dr. Aline Brochure on September 5, and was been noted to develop atrial fibrillation in the postoperative setting. He was placed on intravenous diltiazem for rate control, and has subsequently converted to sinus rhythm spontaneously. On examination today he is comfortable,  denies chest pain or palpitations. Lungs are clear with decreased breath sounds, cardiac reveals RRR with soft systolic murmur. Cardiac markers argue against ACS, ECGs reviewed as well showing nonspecific ST changes in the setting of atrial fibrillation with RVR. CHADSVASC score is 6, however he is not a good candidate for coagulation in light of several concerns including legal blindness with falls, anemia and thrombocytopenia, and also a large AAA measuring 6.9 cm it is felt to be inoperable for  assessment by Dr. Kellie Simmering. May be able to tolerate low-dose aspirin. Pre-was switched to oral diltiazem for now. He also continues on Toprol-XL at baseline. Follow-up ECG in sinus rhythm. Echocardiogram also ordered.  Satira Sark, M.D., F.A.C.C.

## 2015-03-18 NOTE — Progress Notes (Signed)
Pt changed over to 50% v. Mask from 4lpm cann due to dropping in o2 sats ranging from 87-93% not a consisted reading on spo2.

## 2015-03-18 NOTE — Progress Notes (Signed)
Subjective: The patient is alert and oriented. He had to be moved to the intensive care stepdown bed because of development of atrial fibrillation with rapid ventricular rate. He was placed on IV Cardizem and current pulse rate is 72. He is 3 days postop following surgery for intertrochanteric fracture of right hip. He is having minimal pain with reference to this procedure  Objective: Vital signs in last 24 hours: Temp:  [97.9 F (36.6 C)-98.7 F (37.1 C)] 97.9 F (36.6 C) (09/08 0415) Pulse Rate:  [65-151] 72 (09/08 0600) Resp:  [16-30] 21 (09/08 0600) BP: (128-167)/(44-94) 142/55 mmHg (09/08 0600) SpO2:  [84 %-97 %] 95 % (09/08 0628) FiO2 (%):  [50 %] 50 % (09/08 0628) Weight:  [68.6 kg (151 lb 3.8 oz)] 68.6 kg (151 lb 3.8 oz) (09/08 0500) Weight change:  Last BM Date:  (unknown)  Intake/Output from previous day: 09/07 0701 - 09/08 0700 In: 600 [P.O.:600] Out: 800 [Urine:800] Intake/Output this shift: Total I/O In: 120 [P.O.:120] Out: 700 [Urine:700]  Physical Exam: General appearance patient is alert and oriented  HEENT negative patient is totally blind  Heart irregular rhythm no murmur murmurs no cardiomegaly  Lungs clear to P&A  Abdomen no palpable organs or masses  Slight tenderness in that side of the incision of right hip   Recent Labs  03/17/15 0636 03/18/15 0242  WBC 6.9 6.1  HGB 9.3* 8.8*  HCT 27.5* 25.6*  PLT 88* 75*   BMET  Recent Labs  03/17/15 0636 03/17/15 2116  NA 136 135  K 4.1 3.8  CL 106 106  CO2 26 22  GLUCOSE 110* 113*  BUN 13 10  CREATININE 0.83 0.83  CALCIUM 7.9* 7.7*    Studies/Results: No results found.  Medications:  . atorvastatin  40 mg Oral q1800  . docusate sodium  100 mg Oral BID  . folic acid  3,435 mcg Oral Daily  . lisinopril  40 mg Oral Daily  . methocarbamol  500 mg Oral QID  . metoprolol succinate  50 mg Oral Daily  . multivitamin with minerals  1 tablet Oral Daily  . polyethylene glycol  17 g Oral  Daily  . sodium chloride  3 mL Intravenous Q12H  . traMADol  50 mg Oral 4 times per day  . traMADol  50 mg Oral Once    . sodium chloride 100 mL/hr at 03/18/15 0612  . diltiazem (CARDIZEM) infusion 5 mg/hr (03/18/15 0201)     Assessment/Plan: 1. Intertrochanteric fracture right hip 3 days postop-plan to continue physical therapy when appropriate and patient is scheduled for move to skilled care  2. Known aortic aneurysm 6.9 cm-patient then followed by vascular surgery [place] for this condition  3. Acute onset of atrial fibrillation with rapid ventricular response-2 continue IV Cardizem will obtain cardiology consult   LOS: 4 days   Nadirah Socorro G 03/18/2015, 6:58 AM

## 2015-03-19 DIAGNOSIS — I480 Paroxysmal atrial fibrillation: Secondary | ICD-10-CM

## 2015-03-19 DIAGNOSIS — I10 Essential (primary) hypertension: Secondary | ICD-10-CM

## 2015-03-19 MED ORDER — CLONIDINE HCL 0.1 MG PO TABS
0.1000 mg | ORAL_TABLET | Freq: Four times a day (QID) | ORAL | Status: DC | PRN
  Administered 2015-03-19 – 2015-03-22 (×5): 0.1 mg via ORAL
  Filled 2015-03-19 (×5): qty 1

## 2015-03-19 MED ORDER — LEVOFLOXACIN 500 MG PO TABS
500.0000 mg | ORAL_TABLET | Freq: Every day | ORAL | Status: DC
  Administered 2015-03-19 – 2015-03-24 (×6): 500 mg via ORAL
  Filled 2015-03-19 (×6): qty 1

## 2015-03-19 NOTE — Progress Notes (Signed)
Consulting cardiologist: Jenkins Rouge MD Primary Cardiologist:  Dr. Bronson Ing.  Cardiology Specific Problem List: 1. Newly Documented Atrial fib-CHADS 6. 2. CAD-Hx of CABG 3. AAA 4. Hypertension   Subjective:    Complaints of pain in his stomach and hip with coughing.   Objective:   Temp:  [98.1 F (36.7 C)-98.9 F (37.2 C)] 98.7 F (37.1 C) (09/09 0400) Pulse Rate:  [59-106] 106 (09/09 0700) Resp:  [18-31] 20 (09/09 0700) BP: (143-198)/(57-101) 198/92 mmHg (09/09 0700) SpO2:  [85 %-100 %] 94 % (09/09 0700) Weight:  [158 lb 1.1 oz (71.7 kg)] 158 lb 1.1 oz (71.7 kg) (09/09 0500) Last BM Date:  (unknown)  Filed Weights   03/17/15 2124 03/18/15 0500 03/19/15 0500  Weight: 151 lb 3.8 oz (68.6 kg) 151 lb 3.8 oz (68.6 kg) 158 lb 1.1 oz (71.7 kg)    Intake/Output Summary (Last 24 hours) at 03/19/15 0847 Last data filed at 03/19/15 0500  Gross per 24 hour  Intake   1140 ml  Output    850 ml  Net    290 ml    Telemetry: NSR with frequent PVC's   Exam:  General: No acute distress.  HEENT: Conjunctiva and lids normal, oropharynx clear.He is blind  Lungs: Bilateral crackles with coughing.  Cardiac: No elevated JVP or bruits. RRR, occasional irregular  no gallop or rub.   Abdomen: Normoactive bowel sounds, nontender, nondistended.  Extremities: No pitting edema, distal pulses full.  Neuropsychiatric: Alert and oriented x3, affect appropriate.   Lab Results:  Basic Metabolic Panel:  Recent Labs Lab 03/16/15 0417 03/17/15 0636 03/17/15 2116  NA 136 136 135  K 4.0 4.1 3.8  CL 104 106 106  CO2 26 26 22   GLUCOSE 115* 110* 113*  BUN 13 13 10   CREATININE 0.78 0.83 0.83  CALCIUM 7.8* 7.9* 7.7*  MG  --   --  1.7    Liver Function Tests:  Recent Labs Lab 03/15/15 0545  AST 118*  ALT 130*  ALKPHOS 135*  BILITOT 0.5  PROT 5.8*  ALBUMIN 3.5    CBC:  Recent Labs Lab 03/16/15 0417 03/17/15 0636 03/18/15 0242  WBC 7.3 6.9 6.1  HGB 9.2* 9.3*  8.8*  HCT 27.0* 27.5* 25.6*  MCV 98.9 99.3 98.5  PLT 81* 88* 75*    Cardiac Enzymes:  Recent Labs Lab 03/17/15 2116 03/18/15 0242 03/18/15 0908  TROPONINI 0.03 0.04* <0.03   Coagulation:  Recent Labs Lab 03/15/15 0545  INR 1.24    Radiology: Dg Chest Port 1 View  03/18/2015   CLINICAL DATA:  Cardiac palpitations, COPD  EXAM: PORTABLE CHEST - 1 VIEW  COMPARISON:  03/14/2015  FINDINGS: Cardiac shadow is stable. Postoperative changes are again seen. The lungs are well aerated bilaterally although new infiltrate is noted in the medial right lung base. No acute bony abnormality is seen.  IMPRESSION: New right basilar pneumonia. Followup PA and lateral chest X-ray is recommended in 3-4 weeks following trial of antibiotic therapy to ensure resolution and exclude underlying malignancy.   Electronically Signed   By: Inez Catalina M.D.   On: 03/18/2015 09:57      Medications:   Scheduled Medications: . atorvastatin  40 mg Oral q1800  . diltiazem  30 mg Oral 3 times per day  . docusate sodium  100 mg Oral BID  . folic acid  2,725 mcg Oral Daily  . levofloxacin  500 mg Oral Daily  . lisinopril  40 mg Oral Daily  .  methocarbamol  500 mg Oral QID  . metoprolol succinate  50 mg Oral Daily  . multivitamin with minerals  1 tablet Oral Daily  . polyethylene glycol  17 g Oral Daily  . sodium chloride  3 mL Intravenous Q12H  . traMADol  50 mg Oral 4 times per day  . traMADol  50 mg Oral Once    Infusions: . sodium chloride 100 mL/hr at 03/19/15 0211    PRN Medications: alum & mag hydroxide-simeth, bisacodyl, HYDROmorphone (DILAUDID) injection, ipratropium-albuterol, labetalol, magnesium citrate, menthol-cetylpyridinium **OR** phenol, metoCLOPramide **OR** metoCLOPramide (REGLAN) injection, ondansetron **OR** ondansetron (ZOFRAN) IV   Assessment and Plan:   1. New Onset Atrial Fib: Now transitioned to po diltiazem 30 mg Q 8 hours and remains in NSR. Heart rate is well controlled for  the most part, but does go up with coughing and pain. He is NOT a candidate for anticoagulation with multiple co-morbidities, to include AAA, (inoperable) anemia and thrombocytopenia, despite CHADS VASC Score of 6.   2. Hypertension: BP is elevated this am, trends from 291'B to 166'M systolic. He is on lisinopril, metoprolol, diltiazem and labetalol prn. Can consider going up on diltiazem to 60 mg Q 8 hrs vs adding hydralazine. Will monitor for need to adjust medications.   3. CAD: Hx of CABG. Continue statin, BB, and ACE.   4. Pneumonia: Noted on CXR done yesterday. Being treated by PCP  Phill Myron. Lawrence NP [place]  03/19/2015, 8:47 AM   Patient examined chart reviewed.  Maint NSR  Agree with no anticoagulation given age and co morbidities.  Continue current BP meds.  No active ischemia with history of CABG  Pneumonia Rx per primary team.  On levofloxin.    Jenkins Rouge

## 2015-03-19 NOTE — Clinical Social Work Note (Signed)
CSW updated [facility] on pt. Facility continues to be willing to accept pt at d/c. Anticipate early next week per MD.   Benay Pike, [place]

## 2015-03-19 NOTE — Progress Notes (Signed)
Physical Therapy is here to work with patient. Because pt had a change in status when he went into a-fib with RVR, physical therapy wants to make sure he is stable enough to receive therapy. Pt is now off of IV Cardizem and HR is controlled (in the 80s-90s) with PO medications. Dr. Everette Rank is aware and he asked that physical therapy continue treatment. Order has been updated.

## 2015-03-19 NOTE — Progress Notes (Signed)
PT Cancellation Note  Patient Details Name: [name] MRN: [id number] DOB: 02-02-1929   Cancelled Treatment:    Reason Eval/Treat Not Completed: Medical issues which prohibited therapy;Patient not medically ready. Chart reviewed, RN consulted. Pt recently transferred to ICU, and have since received verbal confirmation to continue treatment from Dr. Everette Rank via RN. Holding pt treatment at this time due to continued HTN in 170-180's/80 c PVCs as high as 8. Granted pt history of large stable AAA, will wait until pt is more medically stable and able to safely tolerate exertion. Will attempt at later date/time.     [name] C 03/19/2015, 2:09 PM  2:12 PM  Etta Grandchild, PT, DPT [place] License # [id number]

## 2015-03-19 NOTE — Progress Notes (Signed)
Subjective: The patient is alert and oriented. He currently is on by mouth Cardizem and currently pulse rate is 72 but he still in atrial fibrillation. He has developed a cough x-ray did show evidence of early pneumonia. Patient's 4 days postop following surgery for intertrochanteric fracture right hip. He continues to have minimal pain. Of incision  Objective: Vital signs in last 24 hours: Temp:  [98.1 F (36.7 C)-98.9 F (37.2 C)] 98.7 F (37.1 C) (09/09 0400) Pulse Rate:  [59-103] 88 (09/09 0600) Resp:  [18-31] 21 (09/09 0600) BP: (143-170)/(57-101) 162/76 mmHg (09/09 0637) SpO2:  [85 %-100 %] 92 % (09/09 0600) Weight:  [71.7 kg (158 lb 1.1 oz)] 71.7 kg (158 lb 1.1 oz) (09/09 0500) Weight change: 3.1 kg (6 lb 13.4 oz) Last BM Date:  (unknown)  Intake/Output from previous day: 09/08 0701 - 09/09 0700 In: 1360 [P.O.:360; I.V.:1000] Out: 850 [Urine:850] Intake/Output this shift:    Physical Exam: General appearance patient is alert and oriented  HEENT negative except the patient is totally blind  Lungs clear to P&A occasional rhonchus heard lower lung field  Heart irregular rhythm no murmurs  Abdomen no palpable organs or masses  Slight tenderness over incision of right hip   Recent Labs  03/17/15 0636 03/18/15 0242  WBC 6.9 6.1  HGB 9.3* 8.8*  HCT 27.5* 25.6*  PLT 88* 75*   BMET  Recent Labs  03/17/15 0636 03/17/15 2116  NA 136 135  K 4.1 3.8  CL 106 106  CO2 26 22  GLUCOSE 110* 113*  BUN 13 10  CREATININE 0.83 0.83  CALCIUM 7.9* 7.7*    Studies/Results: Dg Chest Port 1 View  03/18/2015   CLINICAL DATA:  Cardiac palpitations, COPD  EXAM: PORTABLE CHEST - 1 VIEW  COMPARISON:  03/14/2015  FINDINGS: Cardiac shadow is stable. Postoperative changes are again seen. The lungs are well aerated bilaterally although new infiltrate is noted in the medial right lung base. No acute bony abnormality is seen.  IMPRESSION: New right basilar pneumonia. Followup PA and  lateral chest X-ray is recommended in 3-4 weeks following trial of antibiotic therapy to ensure resolution and exclude underlying malignancy.   Electronically Signed   By: Inez Catalina M.D.   On: 03/18/2015 09:57    Medications:  . atorvastatin  40 mg Oral q1800  . diltiazem  30 mg Oral 3 times per day  . docusate sodium  100 mg Oral BID  . folic acid  0,938 mcg Oral Daily  . levofloxacin  500 mg Oral Daily  . lisinopril  40 mg Oral Daily  . methocarbamol  500 mg Oral QID  . metoprolol succinate  50 mg Oral Daily  . multivitamin with minerals  1 tablet Oral Daily  . polyethylene glycol  17 [name] Oral Daily  . sodium chloride  3 mL Intravenous Q12H  . traMADol  50 mg Oral 4 times per day  . traMADol  50 mg Oral Once    . sodium chloride 100 mL/hr at 03/19/15 0211     Assessment/Plan: 1 intratrochanteric fracture right hip 4 days postop-plan to continue physical therapy when appropriate-patient scheduled for move to skilled care. This will have to be postponed until the first of the week  2. Aortic aneurysm 6.9 cm-this is inoperable has been followed by vascular surgery in [facility]  3. Atrial fibrillation currently being controlled with by mouth Cardizem  4. New-onset pneumonia-plan to start Levaquin 500 mg daily   LOS: 5 days  [name] 03/19/2015, 7:05 AM

## 2015-03-19 NOTE — Progress Notes (Signed)
   Postop day #4 status post gamma nail right hip. Patient has developed pneumonia  Continues to have intermittent arrhythmia issues and is continued to be in the ICU  From an orthopedic standpoint he can be weightbearing as tolerated when medical condition allows  Dressing can be changed daily  Continue to follow.

## 2015-03-19 NOTE — Care Management Important Message (Signed)
Important Message  Patient Details  Name: [name] MRN: [id number] Date of Birth: Aug 01, 1928   Medicare Important Message Given:  Yes-second notification given    Sherald Barge, RN 03/19/2015, 3:59 PM

## 2015-03-20 LAB — OCCULT BLOOD X 1 CARD TO LAB, STOOL: Fecal Occult Bld: NEGATIVE

## 2015-03-20 MED ORDER — METOPROLOL TARTRATE 25 MG PO TABS
25.0000 mg | ORAL_TABLET | Freq: Two times a day (BID) | ORAL | Status: DC
  Administered 2015-03-20 – 2015-03-24 (×9): 25 mg via ORAL
  Filled 2015-03-20 (×9): qty 1

## 2015-03-20 NOTE — Progress Notes (Signed)
Subjective: The patient is alert and oriented. His had some difficulty with swallowing. His atrial fibrillation is controlled. He is 5 days postop following surgery for intertrochanteric fracture right hip. He continues to have minimal pain and incision  Objective: Vital signs in last 24 hours: Temp:  [98.4 F (36.9 C)-98.7 F (37.1 C)] 98.7 F (37.1 C) (09/10 0812) Pulse Rate:  [71-104] 75 (09/10 0800) Resp:  [18-32] 25 (09/10 0800) BP: (148-187)/(59-142) 160/65 mmHg (09/10 0800) SpO2:  [94 %-100 %] 98 % (09/10 0800) Weight change:  Last BM Date: 03/20/15  Intake/Output from previous day: 09/09 0701 - 09/10 0700 In: 3703 [I.V.:3703] Out: 1300 [Urine:1300] Intake/Output this shift: Total I/O In: 176.7 [I.V.:176.7] Out: 125 [Urine:125]  Physical Exam: General appearance patient is alert and oriented  HEENT negative except patient is totally blind  Lungs clear to P&A  Heart irregular rhythm no murmurs  Abdomen no palpable organs or masses  Slight tenderness over incision of right hip   Recent Labs  03/18/15 0242  WBC 6.1  HGB 8.8*  HCT 25.6*  PLT 75*   BMET  Recent Labs  03/17/15 2116  NA 135  K 3.8  CL 106  CO2 22  GLUCOSE 113*  BUN 10  CREATININE 0.83  CALCIUM 7.7*    Studies/Results: Dg Chest Port 1 View  03/18/2015   CLINICAL DATA:  Cardiac palpitations, COPD  EXAM: PORTABLE CHEST - 1 VIEW  COMPARISON:  03/14/2015  FINDINGS: Cardiac shadow is stable. Postoperative changes are again seen. The lungs are well aerated bilaterally although new infiltrate is noted in the medial right lung base. No acute bony abnormality is seen.  IMPRESSION: New right basilar pneumonia. Followup PA and lateral chest X-ray is recommended in 3-4 weeks following trial of antibiotic therapy to ensure resolution and exclude underlying malignancy.   Electronically Signed   By: Inez Catalina M.D.   On: 03/18/2015 09:57    Medications:  . atorvastatin  40 mg Oral q1800  .  diltiazem  30 mg Oral 3 times per day  . docusate sodium  100 mg Oral BID  . folic acid  1,610 mcg Oral Daily  . levofloxacin  500 mg Oral Daily  . lisinopril  40 mg Oral Daily  . methocarbamol  500 mg Oral QID  . metoprolol succinate  50 mg Oral Daily  . multivitamin with minerals  1 tablet Oral Daily  . polyethylene glycol  17 g Oral Daily  . sodium chloride  3 mL Intravenous Q12H  . traMADol  50 mg Oral 4 times per day  . traMADol  50 mg Oral Once    . sodium chloride 100 mL/hr at 03/20/15 0600     Assessment/Plan: 1. Intertrochanteric fracture right hip 5 days postop-plan to continue physical therapy when appropriate and moved to skilled care when appropriate  2. Aortic aneurysm 6.9 cm-this is inoperable and is followed by vascular surgery [place]  3. Chronic atrial fibrillation currently being controlled with Cardizem by mouth  4. New-onset pneumonia continue Levaquin 500 mg daily   LOS: 6 days   Tamkia Temples G 03/20/2015, 8:48 AM

## 2015-03-20 NOTE — Progress Notes (Signed)
Pt transferring to unit 300 today. Pt/family is aware and agreeable to transfer. Assessment is unchanged from this morning and receiving RN has been given report. Belongings sent with pt at bedside.  

## 2015-03-20 NOTE — Progress Notes (Signed)
PT Cancellation Note  Patient Details Name: [name] MRN: [id number] DOB: 31-Aug-1928   Cancelled Treatment:    Reason Eval/Treat Not Completed: Medical issues which prohibited therapy.  BP has been extremely volatile with high readings.  It is not appropriate to work with him today.  I will check on him tomorrow.   Demetrios Isaacs L  PT 03/20/2015, 11:40 AM [phone]

## 2015-03-20 NOTE — Progress Notes (Signed)
Patient arrived to floor. Alert and oriented. SCD's in place. Patient denies pain. Right hip dressing clean dry and intact. Telemetry applid.

## 2015-03-21 NOTE — Progress Notes (Signed)
Subjective: The patient is alert and oriented and had a fairly good night. His atrial fibrillation is controlled is 6 days postop following surgery for intratrochanteric fracture right hip. He did develop pneumonia which is improving he still coughing intermittently  Objective: Vital signs in last 24 hours: Temp:  [98 F (36.7 C)-98.9 F (37.2 C)] 98.4 F (36.9 C) (09/11 0629) Pulse Rate:  [70-88] 86 (09/11 0629) Resp:  [20-26] 20 (09/11 0629) BP: (136-189)/(60-138) 183/80 mmHg (09/11 0629) SpO2:  [95 %-100 %] 95 % (09/11 0629) Weight change:  Last BM Date: 03/20/15  Intake/Output from previous day: 09/10 0701 - 09/11 0700 In: 2488.3 [P.O.:120; I.V.:2368.3] Out: 725 [Urine:725] Intake/Output this shift:    Physical Exam: Gen. appearance patient is alert and oriented  HEENT negative except patient is totally blind  Lungs occasional rhonchus  Heart irregular rhythm no murmurs  Abdomen no palpable organs or masses  Slight tenderness over incision of right hip  No results for input(s): WBC, HGB, HCT, PLT in the last 72 hours. BMET No results for input(s): NA, K, CL, CO2, GLUCOSE, BUN, CREATININE, CALCIUM in the last 72 hours.  Studies/Results: No results found.  Medications:  . atorvastatin  40 mg Oral q1800  . diltiazem  30 mg Oral 3 times per day  . docusate sodium  100 mg Oral BID  . folic acid  8,478 mcg Oral Daily  . levofloxacin  500 mg Oral Daily  . lisinopril  40 mg Oral Daily  . methocarbamol  500 mg Oral QID  . metoprolol tartrate  25 mg Oral BID  . multivitamin with minerals  1 tablet Oral Daily  . polyethylene glycol  17 g Oral Daily  . sodium chloride  3 mL Intravenous Q12H  . traMADol  50 mg Oral 4 times per day  . traMADol  50 mg Oral Once    . sodium chloride 100 mL/hr at 03/21/15 0100     Assessment/Plan: 1 intratrochanteric fracture right hip-plan to continue physical therapy as appropriate and patient will be moved to skilled care when  appropriate for further rehabilitation  2. Aortic aneurysm 6.9 cm-this is an operable and followed by basket surgery in [place]  3. Chronic atrial fibrillation-being controlled with cauterization by mouth  4. New-onset pneumonia plan to continue Levaquin 500 milligrams daily   LOS: 7 days   Gurman Ashland G 03/21/2015, 7:25 AM

## 2015-03-21 NOTE — Progress Notes (Signed)
Physical Therapy Treatment Patient Details Name: [name] MRN: [id number] DOB: 12-Jun-1929 Today's Date: 03/21/2015    History of Present Illness Pt is an 81 y.o. male with hx of HTN, total blindness, 5.6 cm stable AAA (poor surgical candidate, followed by Dr [name]), prior CVA, anxiety, depression, lives at home with his son, GERD, known CAD with CABG in the 90's, with no DOE or angina symptoms, tripped, fell, and suffered a comminuted Fx of the right hip.  His Cr is normal and Hb was at 12.6.  Dr [name] was consulted and will see him.  Hospitalist was asked to admit him for same.  Pt underwent an OTIF of the right hip on 03-15-15, beginning rehab on 03-16-15.  He had to be transferred to ICU on 03-17-15 due to Afib and has had an unstable cardiac status since then.  Today, he is now on the Med-Surg floor and his HR/BP are stable.  PT is resumed.    PT Comments    Pt was found to be in bed, alert/oriented and very cooperative.  He was anxious to get OOB.  He had no pain in the hip at rest.  We initiated therapeutic exercise to both LEs which was tolerated well.  He was able to achieve about 60 degrees of R hip flexion.  He required mod assist to transfer to EOB, max assist to stand to a walker.  He was able to partially weight bear on the RLE and slowly pivot with the walker to the chair.  He did c/o aching in the right hip with weight bearing.  He is now up in the chair.  I discussed with nursing staff how to assist him back to the bed.  He will try to sit up for 2 hours.  He should progress well in rehab.  Follow Up Recommendations  SNF     Equipment Recommendations  None recommended by PT    Recommendations for Other Services OT consult     Precautions / Restrictions Precautions Precautions: Fall Precaution Comments: pt is totally bilind Restrictions Weight Bearing Restrictions: No RLE Weight Bearing: Weight bearing as tolerated    Mobility  Bed Mobility Overal bed mobility: Needs  Assistance Bed Mobility: Supine to Sit     Supine to sit: HOB elevated;Mod assist     General bed mobility comments: pt is able to assist to a small degree to transfer to EOB  Transfers Overall transfer level: Needs assistance Equipment used: Rolling walker (2 wheeled) Transfers: Sit to/from Stand Sit to Stand: From elevated surface;Max assist Stand pivot transfers: Min assist       General transfer comment: pt able to stand to a walker in fully erect position and slowly pivot walker and feet to sit to a chair  Ambulation/Gait             General Gait Details: Unable                       Balance Overall balance assessment: Needs assistance   Sitting balance-Leahy Scale: Good     Standing balance support: Bilateral upper extremity supported Standing balance-Leahy Scale: Fair                      Cognition Arousal/Alertness: Awake/alert Behavior During Therapy: WFL for tasks assessed/performed Overall Cognitive Status: Within Functional Limits for tasks assessed  Exercises General Exercises - Lower Extremity Ankle Circles/Pumps: AROM;Both;10 reps;Supine Quad Sets: AROM;Both;10 reps;Supine Gluteal Sets: AROM;Both;10 reps;Supine Short Arc Quad: AROM;AAROM;Both;10 reps;Supine Heel Slides: AROM;AAROM;Both;10 reps;Supine Hip ABduction/ADduction: AROM;AAROM;Both;10 reps;Supine            Pertinent Vitals/Pain Pain Assessment: No/denies pain (at rest)                                      PT Goals (current goals can now be found in the care plan section) Progress towards PT goals: Progressing toward goals    Frequency  7X/week    PT Plan Current plan remains appropriate    Co-evaluation             End of Session Equipment Utilized During Treatment: Gait belt;Oxygen Activity Tolerance: Patient tolerated treatment well;No increased pain Patient left: in chair;with call bell/phone  within reach;with chair alarm set;with nursing/sitter in room     Time: 0943-1020 PT Time Calculation (min) (ACUTE ONLY): 37 min  Charges:  $Therapeutic Exercise: 8-22 mins $Therapeutic Activity: 8-22 mins                    G CodesSable [name]   PT  03/21/2015, 10:42 AM [phone]

## 2015-03-21 NOTE — Progress Notes (Signed)
1853 PRN Clonidine 0.1mg  PO given for BP 172/67 as ordered.

## 2015-03-22 MED ORDER — ZOLPIDEM TARTRATE 5 MG PO TABS
5.0000 mg | ORAL_TABLET | Freq: Every evening | ORAL | Status: DC | PRN
  Administered 2015-03-22 (×2): 5 mg via ORAL
  Filled 2015-03-22 (×2): qty 1

## 2015-03-22 NOTE — Evaluation (Signed)
Clinical/Bedside Swallow Evaluation Patient Details  Name: [name] MRN: [id number] Date of Birth: 06-29-1929  Today's Date: 03/22/2015 Time: SLP Start Time (ACUTE ONLY): 58 SLP Stop Time (ACUTE ONLY): 1600 SLP Time Calculation (min) (ACUTE ONLY): 20 min  Past Medical History:  Past Medical History  Diagnosis Date  . Essential hypertension   . COPD (chronic obstructive pulmonary disease)   . History of stroke   . Glaucoma   . BPH (benign prostatic hyperplasia)   . Anxiety   . Depression   . Blindness - both eyes     Secondary to glaucoma  . GERD (gastroesophageal reflux disease)   . Headache(784.0)   . Aortic aneurysm   . CAD (coronary artery disease)     Multivessel status post CABG 1998  . PAD (peripheral artery disease)   . Carotid disease, bilateral   . Femoral artery stenosis   . SVT (supraventricular tachycardia)    Past Surgical History:  Past Surgical History  Procedure Laterality Date  . Eye surgery      Stick removed from eye as child;deadened nerves in right eye age 32  . Cataract extraction Left 2009  . Eye surgery      Corneal ruptured and removed eye completely  . Cholecystectomy  20+ yrs ago  . Carotid endarterectomy Right   . Transurethral resection of prostate    . Transurethral resection of prostate  08/23/2011    Procedure: TRANSURETHRAL RESECTION OF THE PROSTATE (TURP);  Surgeon: Marissa Nestle, MD;  Location: [facility];  Service: Urology;  Laterality: N/A;  . Intramedullary (im) nail intertrochanteric Right 03/15/2015    Procedure: OPEN TREATMENT INTERNAL FIXATION WITH INTRAMEDULLARY  NAIL RIGHT HIP;  Surgeon: Carole Civil, MD;  Location: [facility];  Service: Orthopedics;  Laterality: Right;  . Coronary artery bypass graft      1998 with a LIMA to the LAD, saphenousvein graft to the obtuse marginal and saphenous vein graft to the posterior descending branch   HPI:  81 year old male who presents after fall. He has a history of hypertension,  CVA, COPD, and blindness. Ambulates with assistance, and was walking back from the restroom with his son this morning. He was assisted into a swivel chair, but the chair swivels out from underneath him, and he fell on his right side. He did not have head strike or loss of consciousness. He is not on any anticoagulation. Complains of significant pain on the right hip, was unable to ambulate after his fall. EMS was subsequently called, and in route received 6 mg of morphine. Pt is totally blind but hearing is good. He had episode of atrial fibrillation with RVR on 03/17/2015 while recovering from surgery and had been undergoing PT. He has symptoms of nausea vomiting and dry heaving. He was transferred to ICU and given IV diltiazem.  Chest X-Ray 9/8 showed New right basilar pneumonia. Followup PA and lateral chest X-ray is   Assessment / Plan / Recommendation Clinical Impression  Pt presents with mild oropharyngeal dysphagia with ST referral following chest X-Ray showing new R Lobe basilar pneumonia and occasional reported coughing and swallowing difficulty. Pt with no overt s/sx of aspiration with thin presentations although with mech soft textures pt demonstrated one episode of wet vocal quality and coughing X2 during trials. Pt will be d/c to SNF tomorrow, recommend speech consult and f/u upon admission to ensure diet tolerance. Recommend continue D3/thin and medications to be administered one at a time with thin liquids.  Aspiration Risk  Mild    Diet Recommendation Dysphagia 3 (Mech soft);Thin   Medication Administration: Whole meds with liquid (ONE AT A TIME) Compensations: Slow rate;Small sips/bites;Follow solids with liquid;Clear throat intermittently;Multiple dry swallows after each bite/sip    Other  Recommendations Oral Care Recommendations: Oral care BID   Follow Up Recommendations       Frequency and Duration        Pertinent Vitals/Pain     SLP Swallow Goals     Swallow  Study Prior Functional Status       General Date of Onset: 03/14/15 Other Pertinent Information: 81 year old male who presents after fall. He has a history of hypertension, CVA, COPD, and blindness. Ambulates with assistance, and was walking back from the restroom with his son this morning. He was assisted into a swivel chair, but the chair swivels out from underneath him, and he fell on his right side. He did not have head strike or loss of consciousness. He is not on any anticoagulation. Complains of significant pain on the right hip, was unable to ambulate after his fall. EMS was subsequently called, and in route received 6 mg of morphine. Pt is totally blind but hearing is good. He had episode of atrial fibrillation with RVR on 03/17/2015 while recovering from surgery and had been undergoing PT. He has symptoms of nausea vomiting and dry heaving. He was transferred to ICU and given IV diltiazem.  Chest X-Ray 9/8 showed New right basilar pneumonia. Followup PA and lateral chest X-ray is Type of Study: Bedside swallow evaluation Diet Prior to this Study: Dysphagia 3 (soft);Thin liquids Temperature Spikes Noted: No Respiratory Status: Supplemental O2 delivered via (comment) History of Recent Intubation: No Behavior/Cognition: Alert;Cooperative;Pleasant mood Oral Cavity - Dentition: Edentulous Self-Feeding Abilities: Able to feed self;Needs set up;Needs assist (assist d/t blindness) Patient Positioning: Upright in bed Baseline Vocal Quality: Normal Volitional Cough: Strong Volitional Swallow: Able to elicit    Oral/Motor/Sensory Function Overall Oral Motor/Sensory Function: Appears within functional limits for tasks assessed   Ice Chips Ice chips: Within functional limits Presentation: Spoon   Thin Liquid Thin Liquid: Within functional limits Presentation: Cup;Straw;Self Fed    Nectar Thick Nectar Thick Liquid: Not tested   Honey Thick Honey Thick Liquid: Not tested   Puree Puree: Not tested    Solid   GO    Solid: Impaired Presentation: Self Fed Oral Phase Impairments: Impaired mastication (d/t edentulous status) Oral Phase Functional Implications: Oral residue Pharyngeal Phase Impairments: Wet Vocal Quality;Cough - Delayed       Wende Bushy 03/22/2015,4:25 PM

## 2015-03-22 NOTE — Clinical Social Work Note (Signed)
CSW spoke with MD regarding pt. Chest xray ordered for today and anticipate d/c to SNF tomorrow. [facility] and daughter updated.   Benay Pike, [place]

## 2015-03-22 NOTE — Progress Notes (Signed)
Patient ID: [name], male   DOB: 06-19-1929, 81 y.o.   MRN: [id number]    Consulting cardiologist: Jenkins Rouge MD Primary Cardiologist:  Dr. Bronson Ing.  Cardiology Specific Problem List: 1. Newly Documented Atrial fib-CHADS 6. 2. CAD-Hx of CABG 3. AAA 4. Hypertension   Subjective:    Doing well Getting bedside PT   Objective:   Temp:  [98 F (36.7 C)-98.2 F (36.8 C)] 98 F (36.7 C) (09/12 0631) Pulse Rate:  [72-74] 74 (09/12 0631) Resp:  [20] 20 (09/12 0631) BP: (165-176)/(59-74) 176/74 mmHg (09/12 0631) SpO2:  [95 %-96 %] 96 % (09/12 0631) Last BM Date: 03/21/15  Filed Weights   03/17/15 2124 03/18/15 0500 03/19/15 0500  Weight: 68.6 kg (151 lb 3.8 oz) 68.6 kg (151 lb 3.8 oz) 71.7 kg (158 lb 1.1 oz)    Intake/Output Summary (Last 24 hours) at 03/22/15 0848 Last data filed at 03/22/15 0800  Gross per 24 hour  Intake 2958.33 ml  Output    900 ml  Net 2058.33 ml    Telemetry: NSR with frequent PVC's   Filed Vitals:   03/22/15 0631  BP: 176/74  Pulse: 74  Temp: 98 F (36.7 C)  Resp: 20    Exam:  General: No acute distress.  HEENT: Conjunctiva and lids normal, oropharynx clear.He is blind  Lungs: Bilateral crackles with coughing.  Cardiac: No elevated JVP or bruits. RRR, occasional irregular  no gallop or rub.   Abdomen: Normoactive bowel sounds, nontender, nondistended.  Extremities: No pitting edema, distal pulses full.  Neuropsychiatric: Alert and oriented x3, affect appropriate.   Lab Results:  Basic Metabolic Panel:  Recent Labs Lab 03/16/15 0417 03/17/15 0636 03/17/15 2116  NA 136 136 135  K 4.0 4.1 3.8  CL 104 106 106  CO2 26 26 22   GLUCOSE 115* 110* 113*  BUN 13 13 10   CREATININE 0.78 0.83 0.83  CALCIUM 7.8* 7.9* 7.7*  MG  --   --  1.7     CBC:  Recent Labs Lab 03/16/15 0417 03/17/15 0636 03/18/15 0242  WBC 7.3 6.9 6.1  HGB 9.2* 9.3* 8.8*  HCT 27.0* 27.5* 25.6*  MCV 98.9 99.3 98.5  PLT 81* 88* 75*     Cardiac Enzymes:  Recent Labs Lab 03/17/15 2116 03/18/15 0242 03/18/15 0908  TROPONINI 0.03 0.04* <0.03   Coagulation: No results for input(s): INR in the last 168 hours.  Radiology: No results found.    Medications:   Scheduled Medications: . atorvastatin  40 mg Oral q1800  . diltiazem  30 mg Oral 3 times per day  . docusate sodium  100 mg Oral BID  . folic acid  2,703 mcg Oral Daily  . levofloxacin  500 mg Oral Daily  . lisinopril  40 mg Oral Daily  . methocarbamol  500 mg Oral QID  . metoprolol tartrate  25 mg Oral BID  . multivitamin with minerals  1 tablet Oral Daily  . polyethylene glycol  17 g Oral Daily  . sodium chloride  3 mL Intravenous Q12H  . traMADol  50 mg Oral 4 times per day  . traMADol  50 mg Oral Once    Infusions: . sodium chloride 100 mL/hr at 03/21/15 2155    PRN Medications: alum & mag hydroxide-simeth, bisacodyl, cloNIDine, HYDROmorphone (DILAUDID) injection, ipratropium-albuterol, labetalol, magnesium citrate, menthol-cetylpyridinium **OR** phenol, metoCLOPramide **OR** metoCLOPramide (REGLAN) injection, ondansetron **OR** ondansetron (ZOFRAN) IV, zolpidem   Assessment and Plan:   1. New Onset Atrial Fib:  Now transitioned to po diltiazem 30 mg Q 8 hours and remains in NSR. Heart rate is well controlled for the most part, but does go up with coughing and pain. He is NOT a candidate for anticoagulation with multiple co-morbidities, to include AAA, (inoperable) anemia and thrombocytopenia, despite CHADS VASC Score of 6.   2. Hypertension: BP is elevated this am, trends from 361'Q to 244'L systolic. He is on lisinopril, metoprolol, diltiazem and labetalol prn. Can consider going up on diltiazem to 60 mg Q 8 hrs vs adding hydralazine. Will monitor for need to adjust medications.   3. CAD: Hx of CABG. Continue statin, BB, and ACE.   4. Pneumonia: Noted on CXR done yesterday. Being treated by PCP  Ok to transfer to rehab.    Jenkins Rouge

## 2015-03-22 NOTE — Progress Notes (Signed)
Subjective: The patient is alert and oriented and had a fairly good night. His atrial fibrillation is controlled he is 7 days postop following surgery for intratrochanteric fracture of right hip. He did develop pneumonia which is improving he still coughing intermittently but afebrile  Objective: Vital signs in last 24 hours: Temp:  [98 F (36.7 C)-98.4 F (36.9 C)] 98 F (36.7 C) (09/11 2309) Pulse Rate:  [72-86] 72 (09/11 2309) Resp:  [20] 20 (09/11 2309) BP: (165-183)/(59-80) 166/59 mmHg (09/11 2309) SpO2:  [95 %] 95 % (09/11 2309) Weight change:  Last BM Date: 03/21/15  Intake/Output from previous day: 09/11 0701 - 09/12 0700 In: 360 [P.O.:360] Out: 900 [Urine:900] Intake/Output this shift: Total I/O In: -  Out: 700 [Urine:700]  Physical Exam: Gen. appearance patient is alert and oriented  HEENT negative except the patient is totally blind  Neck supple no JVD or thyroid abnormalities  Lungs occasional rhonchus heard bilaterally  Heart irregular rhythm no murmurs  Abdomen no palpable organs or masses  Slight tenderness over the incision of right hip  No results for input(s): WBC, HGB, HCT, PLT in the last 72 hours. BMET No results for input(s): NA, K, CL, CO2, GLUCOSE, BUN, CREATININE, CALCIUM in the last 72 hours.  Studies/Results: No results found.  Medications:  . atorvastatin  40 mg Oral q1800  . diltiazem  30 mg Oral 3 times per day  . docusate sodium  100 mg Oral BID  . folic acid  5,462 mcg Oral Daily  . levofloxacin  500 mg Oral Daily  . lisinopril  40 mg Oral Daily  . methocarbamol  500 mg Oral QID  . metoprolol tartrate  25 mg Oral BID  . multivitamin with minerals  1 tablet Oral Daily  . polyethylene glycol  17 g Oral Daily  . sodium chloride  3 mL Intravenous Q12H  . traMADol  50 mg Oral 4 times per day  . traMADol  50 mg Oral Once    . sodium chloride 100 mL/hr at 03/21/15 2155     Assessment/Plan: 1. Intertrochanteric fracture right  hip-continue physical therapy as appropriate-patient will be moved when appropriate to skilled care for further rehabilitation  2. Aortic aneurysm 6.9 cm this is not operable and is followed by vascular surgery in   3. Chronic atrial fibrillation controlled by Cardizem  4. New-onset pneumonia plan to continue Levaquin 500 mg daily repeat chest x-ray today   LOS: 8 days   Millissa Deese G 03/22/2015, 6:06 AM

## 2015-03-22 NOTE — Progress Notes (Signed)
Physical Therapy Treatment Patient Details Name: [name] MRN: [id number] DOB: 1928/08/23 Today's Date: 03/22/2015    History of Present Illness Pt is an 81 y.o. male with hx of HTN, total blindness, 5.6 cm stable AAA (poor surgical candidate, followed by Dr Kellie Simmering), prior CVA, anxiety, depression, lives at home with his son, GERD, known CAD with CABG in the 90's, with no DOE or angina symptoms, tripped, fell, and suffered a comminuted Fx of the right hip.  His Cr is normal and Hb was at 12.6.  Dr Aline Brochure was consulted and will see him.  Hospitalist was asked to admit him for same.      PT Comments    Pt reported no c/o this AM.  He was able to tolerate therapeutic exercise to both LEs with no significant increase in pain, however right hip and knee flexion are still very limited in the supine position.  On an attempt to assist pt to sit at EOB he had severe right hip pain so he was returned to supine and RN was asked to give pt pain meds  As soon as allowed.  I returned after he had been medicated  And we resumed tx.  He required max assist to transfer to seated position and then to stand to a walker.  He was unable to fully extend his knees in stance.  He was able to very slowly take a few steps forward with a walker but was limited by hip pain and fatigue.  He was transferred to a chair where he was comfortable.  Follow Up Recommendations  SNF     Equipment Recommendations  None recommended by PT    Recommendations for Other Services OT consult     Precautions / Restrictions Precautions Precautions: Fall Precaution Comments: pt is totally bilind Restrictions Weight Bearing Restrictions: No RLE Weight Bearing: Weight bearing as tolerated    Mobility  Bed Mobility Overal bed mobility: Needs Assistance Bed Mobility: Supine to Sit     Supine to sit: HOB elevated;Mod assist        Transfers Overall transfer level: Needs assistance Equipment used: Rolling walker (2  wheeled) Transfers: Sit to/from Stand Sit to Stand: From elevated surface;Max assist         General transfer comment: pt has difficulty fully extending either knee today when in stance.  Ambulation/Gait Ambulation/Gait assistance: Mod assist Ambulation Distance (Feet): 4 Feet Assistive device: Rolling walker (2 wheeled) Gait Pattern/deviations: Decreased step length - right;Decreased step length - left;Decreased stance time - right;Decreased dorsiflexion - left;Decreased dorsiflexion - right   Gait velocity interpretation: <1.8 ft/sec, indicative of risk for recurrent falls General Gait Details: pt was able to take a few steps to a chair this morning.  He fatigued very rapidly and c/o severe hip pain in stance.   Stairs            Wheelchair Mobility    Modified Rankin (Stroke Patients Only)       Balance Overall balance assessment: Needs assistance Sitting-balance support: Feet supported;Single extremity supported Sitting balance-Leahy Scale: Fair     Standing balance support: Bilateral upper extremity supported Standing balance-Leahy Scale: Fair                      Cognition Arousal/Alertness: Awake/alert Behavior During Therapy: WFL for tasks assessed/performed Overall Cognitive Status: Within Functional Limits for tasks assessed  Exercises General Exercises - Lower Extremity Ankle Circles/Pumps: AROM;Both;10 reps;Supine Quad Sets: AROM;Both;10 reps;Supine Gluteal Sets: AROM;Both;10 reps;Supine Short Arc Quad: AROM;AAROM;Both;10 reps;Supine Heel Slides: AROM;AAROM;Both;10 reps;Supine    General Comments        Pertinent Vitals/Pain Pain Assessment: 0-10 (at rest)    Home Living                      Prior Function            PT Goals (current goals can now be found in the care plan section) Progress towards PT goals: Progressing toward goals    Frequency  7X/week    PT Plan Current plan  remains appropriate    Co-evaluation             End of Session Equipment Utilized During Treatment: Gait belt;Oxygen Activity Tolerance: Patient tolerated treatment well;No increased pain Patient left: in chair;with call bell/phone within reach;with chair alarm set     Time: 0912 (treatment split into 2 sessions for pain control.Marland KitchenMarland Kitchen2nd visit 10:50-11:09)-0936 PT Time Calculation (min) (ACUTE ONLY): 24 min  Charges:  $Gait Training: 8-22 mins $Therapeutic Exercise: 8-22 mins $Therapeutic Activity: 8-22 mins                    G CodesSable Feil  PT 03/22/2015, 1:19 PM [phone]

## 2015-03-23 ENCOUNTER — Inpatient Hospital Stay (HOSPITAL_COMMUNITY): Payer: Medicare Other

## 2015-03-23 DIAGNOSIS — I480 Paroxysmal atrial fibrillation: Secondary | ICD-10-CM

## 2015-03-23 MED ORDER — LOPERAMIDE HCL 2 MG PO CAPS: 2.0000 mg | ORAL_CAPSULE | ORAL | Status: DC | PRN

## 2015-03-23 MED ORDER — DILTIAZEM HCL ER COATED BEADS 120 MG PO CP24
120.0000 mg | ORAL_CAPSULE | Freq: Every day | ORAL | Status: DC
  Administered 2015-03-23 – 2015-03-24 (×2): 120 mg via ORAL
  Filled 2015-03-23 (×2): qty 1

## 2015-03-23 NOTE — Progress Notes (Signed)
Physical Therapy Treatment Patient Details Name: [name] MRN: [id number] DOB: 1929-01-28 Today's Date: 03/23/2015    History of Present Illness Pt is an 81 y.o. male with hx of HTN, total blindness, 5.6 cm stable AAA (poor surgical candidate, followed by Dr Kellie Simmering), prior CVA, anxiety, depression, lives at home with his son, GERD, known CAD with CABG in the 90's, with no DOE or angina symptoms, tripped, fell, and suffered a comminuted Fx of the right hip.  His Cr is normal and Hb was at 12.6.  Dr Aline Brochure was consulted and will see him.  Hospitalist was asked to admit him for same.      PT Comments    Pt c/o increased fatigue from not having slept well.  He is having less right hip pain with therapeutic exercise and is able to transfer with a bit less assist from therapist.  Gait was not attempted due to pt fatigue.  He is currently up in a chair  Follow Up Recommendations  SNF     Equipment Recommendations  None recommended by PT    Recommendations for Other Services OT consult     Precautions / Restrictions Precautions Precautions: Fall Precaution Comments: pt is totally bilind Restrictions Weight Bearing Restrictions: No RLE Weight Bearing: Weight bearing as tolerated    Mobility  Bed Mobility Overal bed mobility: Needs Assistance Bed Mobility: Supine to Sit     Supine to sit: HOB elevated;Mod assist     General bed mobility comments: pt is able to assist a bit more  to transfer to EOB  Transfers Overall transfer level: Needs assistance Equipment used: Rolling walker (2 wheeled) Transfers: Sit to/from Stand Sit to Stand: From elevated surface;Mod assist Stand pivot transfers: Min assist       General transfer comment: pt is able to extend knees almost fully this morning once in stance  Ambulation/Gait             General Gait Details: gait not attempted today as pt had c/o increased fatigue   Stairs            Wheelchair Mobility     Modified Rankin (Stroke Patients Only)       Balance   Sitting-balance support: Feet supported;No upper extremity supported Sitting balance-Leahy Scale: Fair     Standing balance support: Bilateral upper extremity supported Standing balance-Leahy Scale: Fair                      Cognition Arousal/Alertness: Awake/alert Behavior During Therapy: WFL for tasks assessed/performed Overall Cognitive Status: Within Functional Limits for tasks assessed                      Exercises General Exercises - Lower Extremity Ankle Circles/Pumps: AROM;Both;10 reps;Supine Quad Sets: AROM;Both;10 reps;Supine Gluteal Sets: AROM;Both;10 reps;Supine Heel Slides: AROM;AAROM;Both;10 reps;Supine Hip ABduction/ADduction: AROM;AAROM;Both;10 reps;Supine Straight Leg Raises: AAROM;Left;10 reps;Supine    General Comments        Pertinent Vitals/Pain Pain Assessment: No/denies pain (at rest)    Home Living                      Prior Function            PT Goals (current goals can now be found in the care plan section) Progress towards PT goals: Progressing toward goals    Frequency  7X/week    PT Plan Current plan remains appropriate    Co-evaluation  End of Session Equipment Utilized During Treatment: Gait belt;Oxygen Activity Tolerance: Patient tolerated treatment well;No increased pain Patient left: in chair;with call bell/phone within reach;with chair alarm set     Time: 1015-1100 PT Time Calculation (min) (ACUTE ONLY): 45 min  Charges:  $Therapeutic Exercise: 8-22 mins $Therapeutic Activity: 8-22 mins                    G CodesSable Feil  PT 03/23/2015, 11:12 AM [phone]

## 2015-03-23 NOTE — Progress Notes (Signed)
Subjective: The patient is alert and oriented. He did have uncomfortable night has had several loose stools. He still has cough and is wheezing some. Repeat chest x-ray shows persistent pneumonia in lower lung  Objective: Vital signs in last 24 hours: Temp:  [98 F (36.7 C)-98.7 F (37.1 C)] 98.1 F (36.7 C) (09/13 0350) Pulse Rate:  [68-83] 79 (09/13 0350) Resp:  [18-24] 20 (09/13 0350) BP: (156-180)/(60-78) 179/72 mmHg (09/13 0350) SpO2:  [88 %-99 %] 99 % (09/13 0350) Weight change:  Last BM Date: 03/23/15  Intake/Output from previous day: 09/12 0701 - 09/13 0700 In: 1457.8 [P.O.:480; I.V.:977.8] Out: 550 [Urine:550] Intake/Output this shift: Total I/O In: -  Out: 300 [Urine:300]  Physical Exam: Gen. appearance patient is alert and oriented uncomfortable  HEENT negative except for the fact patient is totally blindness  Neck supple no JVD or thyroid abnormalities  Lungs occasional rhonchus heard bilaterally  Heart irregular rhythm no murmurs  Abdomen no palpable organs or masses  Slight tenderness over the right hip  Rectal examination negative    No results for input(s): WBC, HGB, HCT, PLT in the last 72 hours. BMET No results for input(s): NA, K, CL, CO2, GLUCOSE, BUN, CREATININE, CALCIUM in the last 72 hours.  Studies/Results: No results found.  Medications:  . atorvastatin  40 mg Oral q1800  . diltiazem  30 mg Oral 3 times per day  . folic acid  3,614 mcg Oral Daily  . levofloxacin  500 mg Oral Daily  . lisinopril  40 mg Oral Daily  . methocarbamol  500 mg Oral QID  . metoprolol tartrate  25 mg Oral BID  . multivitamin with minerals  1 tablet Oral Daily  . sodium chloride  3 mL Intravenous Q12H  . traMADol  50 mg Oral 4 times per day  . traMADol  50 mg Oral Once    . sodium chloride 100 mL/hr at 03/22/15 2240     Assessment/Plan: 1. Intertrochanteric fracture right hip-continue physical therapy is appropriate-patient will be moved when  appropriate to skilled care for further rehabilitation dilatation  2. Aortic aneurysm 6.9 Cm inoperable-followed by vascular surgery in [facility]  3 chronic atrial fibrillation-controlled  4. Pneumonia improved continue Levaquin-chest x-ray showed some improvement  5. Acute diarrhea-rule out C. difficile with culture   LOS: 9 days   Alijah Hyde G 03/23/2015, 6:06 AM

## 2015-03-23 NOTE — Progress Notes (Signed)
Primary cardiologist: Dr. Kate Sable  Seen for followup: Atrial fibrillation  Subjective:    Recent diarrhea, intermittent cough. No chest pain or palpitations.  Objective:   Temp:  [98.1 F (36.7 C)-98.6 F (37 C)] 98.1 F (36.7 C) (09/13 0350) Pulse Rate:  [73-83] 79 (09/13 0350) Resp:  [18-20] 20 (09/13 0350) BP: (156-180)/(60-78) 167/78 mmHg (09/13 1029) SpO2:  [93 %-99 %] 99 % (09/13 0350) Last BM Date: 03/23/15  Filed Weights   03/17/15 2124 03/18/15 0500 03/19/15 0500  Weight: 151 lb 3.8 oz (68.6 kg) 151 lb 3.8 oz (68.6 kg) 158 lb 1.1 oz (71.7 kg)    Intake/Output Summary (Last 24 hours) at 03/23/15 1105 Last data filed at 03/23/15 0800  Gross per 24 hour  Intake 1337.83 ml  Output    550 ml  Net 787.83 ml    Telemetry: Sinus rhythm.  Exam:  General: No distress.  Lungs: Scattered rhonchi, coarse breath sounds.   Cardiac: RRR, no gallop.  Extremities: No pitting edema.  Lab Results:  Basic Metabolic Panel:  Recent Labs Lab 03/17/15 0636 03/17/15 2116  NA 136 135  K 4.1 3.8  CL 106 106  CO2 26 22  GLUCOSE 110* 113*  BUN 13 10  CREATININE 0.83 0.83  CALCIUM 7.9* 7.7*  MG  --  1.7    CBC:  Recent Labs Lab 03/17/15 0636 03/18/15 0242  WBC 6.9 6.1  HGB 9.3* 8.8*  HCT 27.5* 25.6*  MCV 99.3 98.5  PLT 88* 75*    Cardiac Enzymes:  Recent Labs Lab 03/17/15 2116 03/18/15 0242 03/18/15 0908  TROPONINI 0.03 0.04* <0.03    Echocardiogram 03/18/2015: Study Conclusions  - Left ventricle: The cavity size was normal. Wall thickness was increased in a pattern of mild LVH. Systolic function was normal. The estimated ejection fraction was in the range of 50% to 55%. Although no diagnostic regional wall motion abnormality was identified, this possibility cannot be completely excluded on the basis of this study. Doppler parameters are consistent with abnormal left ventricular relaxation (grade 1  diastolic dysfunction). - Aortic valve: Mildly calcified annulus. Trileaflet; mildly calcified leaflets. There was mild regurgitation. - Aortic root: The aortic root was mildly dilated. - Mitral valve: Calcified annulus. There was mild regurgitation. - Left atrium: The atrium was mildly dilated. - Right atrium: Central venous pressure (est): 3 mm Hg. - Tricuspid valve: There was trivial regurgitation. - Pulmonic valve: There was mild regurgitation. - Pulmonary arteries: Systolic pressure could not be accurately estimated. - Pericardium, extracardiac: There was no pericardial effusion.  Impressions:  - Mild LVH with LVEF 50-55% and grade 1 diastolic dysfunction. Mild left atrial enlargement. MAC with mild mitral regurgitation. Mildly dilated aortic root. Sclerotic aortic valve with mild aortic regurgitation.   Medications:   Scheduled Medications: . atorvastatin  40 mg Oral q1800  . diltiazem  30 mg Oral 3 times per day  . folic acid  9,604 mcg Oral Daily  . levofloxacin  500 mg Oral Daily  . lisinopril  40 mg Oral Daily  . methocarbamol  500 mg Oral QID  . metoprolol tartrate  25 mg Oral BID  . multivitamin with minerals  1 tablet Oral Daily  . sodium chloride  3 mL Intravenous Q12H  . traMADol  50 mg Oral 4 times per day  . traMADol  50 mg Oral Once    Infusions: . sodium chloride 10 mL/hr at 03/23/15 0700    PRN Medications: alum & mag hydroxide-simeth,  bisacodyl, cloNIDine, HYDROmorphone (DILAUDID) injection, ipratropium-albuterol, labetalol, loperamide, magnesium citrate, menthol-cetylpyridinium **OR** phenol, metoCLOPramide **OR** metoCLOPramide (REGLAN) injection, ondansetron **OR** ondansetron (ZOFRAN) IV, zolpidem   Assessment:   1. Newly documented, paroxysmal atrial fibrillation with CHADSVASC score of 6. He is maintaining sinus rhythm on diltiazem. Not good candidate for anticoagulation (falls, legal blindness, large abdominal aortic aneurysm that  is nonoperable, anemia and thrombocytopenia).  2. Essential hypertension.  3. CAD status post previous CABG, no active angina symptoms.  4. Status post fall with subsequent right intertrochanteric hip fracture. Underwent internal fixation with intramedullary nail by Dr. Aline Brochure on September 5.  5. Large AAA measuring 6.9 cm, felt to be inoperable per Dr. Kellie Simmering.   Plan/Discussion:    Transition from short acting diltiazem to diltiazem CD 120 mg daily.   Satira Sark, M.D., F.A.C.C.

## 2015-03-24 ENCOUNTER — Inpatient Hospital Stay
Admission: RE | Admit: 2015-03-24 | Discharge: 2015-04-08 | Disposition: A | Discharge status: Home / Self Care | Payer: Medicare Other | Source: Ambulatory Visit | Attending: Family Medicine | Admitting: Family Medicine

## 2015-03-24 DIAGNOSIS — R1310 Dysphagia, unspecified: Secondary | ICD-10-CM

## 2015-03-24 DIAGNOSIS — J180 Bronchopneumonia, unspecified organism: Principal | ICD-10-CM

## 2015-03-24 DIAGNOSIS — T148XXA Other injury of unspecified body region, initial encounter: Secondary | ICD-10-CM

## 2015-03-24 LAB — HEMOGLOBIN AND HEMATOCRIT, BLOOD
HEMATOCRIT: 30.9 % — AB (ref 39.0–52.0)
HEMOGLOBIN: 10.5 g/dL — AB (ref 13.0–17.0)

## 2015-03-24 MED ORDER — ZOLPIDEM TARTRATE 5 MG PO TABS: 5.0000 mg | ORAL_TABLET | Freq: Every evening | ORAL | Status: DC | PRN

## 2015-03-24 MED ORDER — LOPERAMIDE HCL 2 MG PO CAPS: 2.0000 mg | ORAL_CAPSULE | ORAL | Status: AC | PRN

## 2015-03-24 MED ORDER — LEVOFLOXACIN 500 MG PO TABS: 500.0000 mg | ORAL_TABLET | Freq: Every day | ORAL | Status: DC

## 2015-03-24 NOTE — Progress Notes (Signed)
0750 MD made aware of patient's lab value Hgb 10.5 this AM, ok to proceed with transfer to [facility] today.

## 2015-03-24 NOTE — Progress Notes (Signed)
[place] notified and report given to nurse Asa Lente, LPN. IV catheter removed from patient's LEFT wrist/distal forearm region, catheter intact, no s/s of infection noted, patient tolerated well w/no c/o pain or discomfort noted. Telemetry monitor and wiring removed from patient and central telemetry notified and made aware. Patient's belongings gathered by patient's daughter and son who accompanied patient during transfer to [facility] room# 121. Paperwork and hard Rxs given to [place], LPN upon arrival to [facility].

## 2015-03-24 NOTE — Discharge Summary (Signed)
Physician Discharge Summary  [name] WFU:[id number] DOB: 10/29/1928 DOA: 03/14/2015  PCP: Lanette Hampshire, MD  Admit date: 03/14/2015 Discharge date: 03/24/2015     Discharge Diagnoses:  1. Intratrochanteric fracture right hip 2. Abdominal aortic aneurysm 3. Atrial fibrillation with controlled rate 4. Bronchopneumonia 5. History coronary artery disease with CABG 6. Hypertension essential 7. Anemia due to previous blood loss  Discharge Condition: Stable Disposition: Discharged to skilled facility  Diet recommendation: Regular diet  Filed Weights   03/17/15 2124 03/18/15 0500 03/19/15 0500  Weight: 68.6 kg (151 lb 3.8 oz) 68.6 kg (151 lb 3.8 oz) 71.7 kg (158 lb 1.1 oz)    History of present illness:  The patient was admitted through the ED after having tripped at home and fallen. He suffered comminuted fracture right hip. He has additional problems of total blindness hypertension 5.6 cm stable abdominal aortic aneurysm has known coronary artery disease with CABG in the 90s. He was admitted for further care orthopedist was notified the aneurysm has been followed by Dr. Kellie Simmering and is inoperable.  Hospital Course:  The patient was admitted to [place] floor. IV fluids were started. Orthopedist was consult to and patient was taken to surgery on 03/15/1599 general anesthesia patient had open internal fixation and intramedullary nail in right hip. Patient tolerated the  procedure in satisfactory manner. Because of the development of atrial fibrillation with rapid ventricular response subsequently the patient was moved to stepdown unit was given intravenous Cardizem 10 mg and converted to sinus rhythm. He was seen in consultation by cardiology and was transitioned to by mouth diltiazem 30 mg every 8 hours. The recommendation was to increase this if needed to 60 mg every 8 hours. During this period time he began to cough intermittently. X-ray of chest revealed areas of atelectasis and possible  early pneumonia. Patient was started on Levaquin 500 mg daily. The patient gradually improved and was moved out of the unit and placed on MedSurg floor. He did develop some diarrhea which improved. He did have a low hemoglobin. This will be checked again today and treated if needed.   Discharge Instructions The patient will be discharged to skilled nursing facility. He will be continued on medications listed below. His hemoglobin will be monitored and he will continue with rehabilitation ambulation etc. with help. Patient needs appointment with orthopedic physician in approximately 1-2 weeks and primary care    Medication List    STOP taking these medications        multivitamin with minerals Tabs tablet      TAKE these medications        acetaminophen 500 MG tablet  Commonly known as:  TYLENOL  Take 1,000 mg by mouth every 6 (six) hours as needed for mild pain.     aspirin EC 81 MG tablet  Take 81 mg by mouth daily.     clopidogrel 75 MG tablet  Commonly known as:  PLAVIX  Take 75 mg by mouth daily.     ferrous sulfate 325 (65 FE) MG tablet  Take 325 mg by mouth daily.     folic acid 202 MCG tablet  Commonly known as:  FOLVITE  Take 1,600 mcg by mouth daily.     levofloxacin 500 MG tablet  Commonly known as:  LEVAQUIN  Take 1 tablet (500 mg total) by mouth daily.     lisinopril 40 MG tablet  Commonly known as:  PRINIVIL,ZESTRIL  Take 40 mg by mouth daily.  loperamide 2 MG capsule  Commonly known as:  IMODIUM  Take 1 capsule (2 mg total) by mouth as needed for diarrhea or loose stools.     metoprolol succinate 50 MG 24 hr tablet  Commonly known as:  TOPROL-XL  Take 50 mg by mouth daily. Take with or immediately following a meal.     simvastatin 80 MG tablet  Commonly known as:  ZOCOR  Take 40 mg by mouth daily. For cholesterol     zolpidem 5 MG tablet  Commonly known as:  AMBIEN  Take 1 tablet (5 mg total) by mouth at bedtime as needed for sleep.        Allergies  Allergen Reactions  . Codeine Hives  . Penicillins Hives    The results of significant diagnostics from this hospitalization (including imaging, microbiology, ancillary and laboratory) are listed below for reference.    Significant Diagnostic Studies: Dg Chest 1 View  03/14/2015   CLINICAL DATA:  [place] this morning when trying to sit in a chair.  EXAM: CHEST  1 VIEW  COMPARISON:  05/13/2012.  FINDINGS: The heart remains normal in size. Stable post CABG changes. The lungs remain clear and hyperexpanded. Thoracolumbar spine degenerative changes. Interval gaseous distention of the stomach.  IMPRESSION: Stable changes of COPD.  Gaseous distention of the stomach.   Electronically Signed   By: Claudie Revering M.D.   On: 03/14/2015 10:15   Dg Chest Port 1 View  03/23/2015   CLINICAL DATA:  Pneumonia.  EXAM: PORTABLE CHEST - 1 VIEW  COMPARISON:  03/18/2015 and prior chest radiographs  FINDINGS: Cardiomediastinal silhouette is unchanged. CABG changes again noted.  Small bilateral pleural effusions are now noted.  Right basilar opacity has slightly increased - question atelectasis versus airspace disease/pneumonia.  Mild left basilar atelectasis is noted.  There is no evidence of pneumothorax.  IMPRESSION: New small bilateral pleural effusions with increased right basilar opacity which may represent atelectasis versus airspace disease/ pneumonia.  Mild left basilar atelectasis.   Electronically Signed   By: Margarette Canada M.D.   On: 03/23/2015 12:04   Dg Chest Port 1 View  03/18/2015   CLINICAL DATA:  Cardiac palpitations, COPD  EXAM: PORTABLE CHEST - 1 VIEW  COMPARISON:  03/14/2015  FINDINGS: Cardiac shadow is stable. Postoperative changes are again seen. The lungs are well aerated bilaterally although new infiltrate is noted in the medial right lung base. No acute bony abnormality is seen.  IMPRESSION: New right basilar pneumonia. Followup PA and lateral chest X-ray is recommended in 3-4 weeks  following trial of antibiotic therapy to ensure resolution and exclude underlying malignancy.   Electronically Signed   By: Inez Catalina M.D.   On: 03/18/2015 09:57   Dg Hip Operative Unilat With Pelvis Right  03/15/2015   CLINICAL DATA:  Fracture. IN nailing done. Fluoroscopy time reportedly 1 minutes 5 seconds.  EXAM: OPERATIVE RIGHT HIP (WITH PELVIS IF PERFORMED) 9 VIEWS  TECHNIQUE: Fluoroscopic spot image(s) were submitted for interpretation post-operatively.  COMPARISON:  03/14/2015  FINDINGS: Multiple images are performed during placement of IM nail and lag screw fixation. Alignment is near anatomic. No new fractures are identified. No dislocation on the images provided.  IMPRESSION: Status post ORIF right hip.  No adverse features identified.   Electronically Signed   By: Nolon Nations M.D.   On: 03/15/2015 14:24   Dg Hip Unilat With Pelvis 2-3 Views Right  03/14/2015   CLINICAL DATA:  [place] this morning trying to sit  down on a chair. Pain in the right hip.  EXAM: DG HIP (WITH OR WITHOUT PELVIS) 2-3V RIGHT  COMPARISON:  08/07/2011  FINDINGS: There is a comminuted but minimally displaced fracture of the right hip. This appears to involve the basicervical region as well as intertrochanteric region.  The visualized portion of the pelvis is intact. Left hip is intact. Dense atherosclerotic calcification of the scratched a dense arterial calcifications.  IMPRESSION: 1. Comminuted fracture of the right hip involving the basicervical and intertrochanteric region. 2. No dislocation.   Electronically Signed   By: Nolon Nations M.D.   On: 03/14/2015 10:03   Ct Angio Abd/pel W/ And/or W/o  03/14/2015   CLINICAL DATA:  Followup abdominal aortic aneurysm.  EXAM: CTA ABDOMEN AND PELVIS WITHOUT AND WITH CONTRAST  TECHNIQUE: Multidetector CT imaging of the abdomen and pelvis was performed using the standard protocol during bolus administration of intravenous contrast. Multiplanar reconstructed images and MIPs were  obtained and reviewed to evaluate the vascular anatomy.  CONTRAST:  127mL OMNIPAQUE IOHEXOL 350 MG/ML SOLN  COMPARISON:  Previous examinations, the most recent dated 08/07/2011.  FINDINGS: Again demonstrated is a large infrarenal abdominal aortic aneurysm. This currently measures 6.9 cm in maximum diameter on image number 59. This previously measured 5.9 cm in maximum diameter. Extensive mural thrombus is again demonstrated. The iliac arteries are normal in caliber. There are extensive arterial calcifications. No retroperitoneal hemorrhage is seen.  The prostate gland is moderately enlarged. Mild diffuse bladder wall thickening. 4 mm mid left renal calculus. Lower pole left renal cyst. The previously demonstrated 1.9 cm medium to high density upper pole left renal mass measures 2.0 cm in maximum diameter on image number 25 and 73 Hounsfield units in density, representing no significant change. 5 mm upper pole right renal calculus. No ureteral calculi or hydronephrosis on either side. No bladder calculi.  Unremarkable spleen, pancreas and right adrenal gland. Surgically absent gallbladder. A medium to low density left adrenal mass has not changed significantly, measuring 2.7 x 1.5 cm on image 19.  No gastrointestinal abnormalities or enlarged lymph nodes. No evidence of appendicitis. Clear lung bases. Lumbar and lower thoracic spine degenerative changes.  Review of the MIP images confirms the above findings.  IMPRESSION: 1. Interval increase in size of the infrarenal abdominal aortic aneurysm, currently measuring 6.9 cm in maximum diameter. Vascular surgery consultation recommended due to increased risk of rupture for AAA >5.5 cm. This recommendation follows ACR consensus guidelines: White Paper of the ACR Incidental Findings Committee II on Vascular Findings. J Am Coll Radiol 2013; 10:789-794. 2. Small, nonobstructing bilateral renal calculi. 3. Stable probable hemorrhagic or proteinaceous cyst in the upper pole of  the left kidney. 4. Stable small lateral segment left lobe probable cyst. 5. Moderately enlarged prostate gland without significant change. 6. Extensive atheromatous arterial calcifications. 7. Stable left adrenal probable adenoma.   Electronically Signed   By: Claudie Revering M.D.   On: 03/14/2015 16:54    Microbiology: Recent Results (from the past 240 hour(s))  Surgical pcr screen     Status: None   Collection Time: 03/14/15 10:30 PM  Result Value Ref Range Status   MRSA, PCR NEGATIVE NEGATIVE Final   Staphylococcus aureus NEGATIVE NEGATIVE Final    Comment:        The Xpert SA Assay (FDA approved for NASAL specimens in patients over 48 years of age), is one component of a comprehensive surveillance program.  Test performance has been validated by [facility] for  patients greater than or equal to 16 year old. It is not intended to diagnose infection nor to guide or monitor treatment.      Labs: Basic Metabolic Panel:  Recent Labs Lab 03/17/15 0636 03/17/15 2116  NA 136 135  K 4.1 3.8  CL 106 106  CO2 26 22  GLUCOSE 110* 113*  BUN 13 10  CREATININE 0.83 0.83  CALCIUM 7.9* 7.7*  MG  --  1.7   Liver Function Tests: No results for input(s): AST, ALT, ALKPHOS, BILITOT, PROT, ALBUMIN in the last 168 hours. No results for input(s): LIPASE, AMYLASE in the last 168 hours. No results for input(s): AMMONIA in the last 168 hours. CBC:  Recent Labs Lab 03/17/15 0636 03/18/15 0242  WBC 6.9 6.1  HGB 9.3* 8.8*  HCT 27.5* 25.6*  MCV 99.3 98.5  PLT 88* 75*   Cardiac Enzymes:  Recent Labs Lab 03/17/15 2116 03/18/15 0242 03/18/15 0908  TROPONINI 0.03 0.04* <0.03   BNP: BNP (last 3 results) No results for input(s): BNP in the last 8760 hours.  ProBNP (last 3 results) No results for input(s): PROBNP in the last 8760 hours.  CBG: No results for input(s): GLUCAP in the last 168 hours.  Principal Problem:   Hip fracture Active Problems:   AAA (abdominal aortic  aneurysm) without rupture   HTN (hypertension)   Blindness   AAA (abdominal aortic aneurysm)   On continuous oral anticoagulation   Intertrochanteric fracture of right femur   PAF (paroxysmal atrial fibrillation)   Time coordinating discharge: 45 minutes  Signed:  [name], MD 03/24/2015, 6:18 AM

## 2015-03-24 NOTE — Clinical Social Work Placement (Signed)
   CLINICAL SOCIAL WORK PLACEMENT  NOTE  Date:  03/24/2015  Patient Details  Name: [name] MRN: [id number] Date of Birth: 06/20/1929  Clinical Social Work is seeking post-discharge placement for this patient at the [place] level of care (*CSW will initial, date and re-position this form in  chart as items are completed):  Yes   Patient/family provided with [place] Work Department's list of facilities offering this level of care within the geographic area requested by the patient (or if unable, by the patient's family).  Yes   Patient/family informed of their freedom to choose among providers that offer the needed level of care, that participate in Medicare, Medicaid or managed care program needed by the patient, have an available bed and are willing to accept the patient.  Yes   Patient/family informed of [facility]'s ownership interest in [facility] and [facility], as well as of the fact that they are under no obligation to receive care at these facilities.  PASRR submitted to EDS on 03/16/15     PASRR number received on 03/16/15     Existing PASRR number confirmed on       FL2 transmitted to all facilities in geographic area requested by pt/family on 03/16/15     FL2 transmitted to all facilities within larger geographic area on       Patient informed that his/her managed care company has contracts with or will negotiate with certain facilities, including the following:        Yes   Patient/family informed of bed offers received.  Patient chooses bed at [facility]     Physician recommends and patient chooses bed at      Patient to be transferred to [facility] on 03/24/15.  Patient to be transferred to facility by staff     Patient family notified on 03/24/15 of transfer.  Name of family member notified:  [place]- son     PHYSICIAN       Additional Comment:     _______________________________________________ Salome Arnt, [place] 03/24/2015, 8:45 AM [phone]

## 2015-03-24 NOTE — Care Management Important Message (Signed)
Important Message  Patient Details  Name: [name] MRN: [id number] Date of Birth: 23-Mar-1929   Medicare Important Message Given:  Yes-third notification given    Joylene Draft, RN 03/24/2015, 9:17 AM

## 2015-03-24 NOTE — Care Management Note (Signed)
Case Management Note  Patient Details  Name: [name] MRN: [id number] Date of Birth: 08-14-1928  Subjective/Objective:                    Action/Plan:   Expected Discharge Date:                  Expected Discharge Plan:  Skilled Nursing Facility  In-House Referral:  Clinical Social Work  Discharge planning Services  CM Consult  Post Acute Care Choice:  NA Choice offered to:  NA  DME Arranged:    DME Agency:     HH Arranged:    [place] Agency:     Status of Service:  Completed, signed off  Medicare Important Message Given:  Yes-third notification given Date Medicare IM Given:    Medicare IM give by:    Date Additional Medicare IM Given:    Additional Medicare Important Message give by:     If discussed at [place] of Stay Meetings, dates discussed:    Additional Comments: Pt discharged to [facility] today. CSW to arrange discharge to facility. Christinia Gully [place], RN 03/24/2015, 9:17 AM

## 2015-03-25 ENCOUNTER — Telehealth: Payer: Self-pay | Admitting: Orthopedic Surgery

## 2015-03-25 NOTE — Telephone Encounter (Signed)
Called back to [facility]; relayed via voice message; faxed Dr Ruthe Mannan note of reply to facility and scheduled appointment (included appointment in faxed note for 04/05/15) .

## 2015-03-25 NOTE — Telephone Encounter (Signed)
Per call from [facility] -- Regarding hospital follow up "1 to 2 weeks" -- please review and advise regarding scheduling (as discussed) - appears to be follow up wound check, post op right hip/IM nail, date of surgery 03/15/15. Okay for work-in next week, or schedule for 04/05/15?  Direct ph# [phone], contact Vickie or Joaquim Lai.

## 2015-03-25 NOTE — Telephone Encounter (Signed)
Remove staples 9/19  F/u here 9/26   xrays right hip 9/20

## 2015-03-31 ENCOUNTER — Ambulatory Visit (HOSPITAL_COMMUNITY)
Admit: 2015-03-31 | Discharge: 2015-03-31 | Disposition: A | Discharge status: Home / Self Care | Payer: Medicare Other | Source: Skilled Nursing Facility | Attending: Internal Medicine | Admitting: Internal Medicine

## 2015-03-31 DIAGNOSIS — J180 Bronchopneumonia, unspecified organism: Secondary | ICD-10-CM | POA: Diagnosis not present

## 2015-03-31 DIAGNOSIS — X58XXXD Exposure to other specified factors, subsequent encounter: Secondary | ICD-10-CM | POA: Insufficient documentation

## 2015-03-31 DIAGNOSIS — S72141D Displaced intertrochanteric fracture of right femur, subsequent encounter for closed fracture with routine healing: Secondary | ICD-10-CM | POA: Diagnosis not present

## 2015-04-01 ENCOUNTER — Telehealth: Payer: Self-pay | Admitting: Orthopedic Surgery

## 2015-04-01 NOTE — Telephone Encounter (Signed)
[place] visit  i saw the xrays   Everything fine on my end    FINDINGS: Interval dynamic screw and intramedullary nail fixation of the comminuted intertrochanteric femur fracture. The fracture appears unchanged without significant displacement. The bones are demineralized. There is no evidence of new fracture or dislocation. Extensive aortoiliac atherosclerosis noted.   IMPRESSION: Intact hardware and stable alignment status post right intertrochanteric fracture fixation.     Electronically Signed   By: Richardean Sale M.D.   On: 03/31/2015 15:45

## 2015-04-01 NOTE — Telephone Encounter (Signed)
Call received from [place], regarding this patient's hospital follow up visit scheduled 04/05/15, for post op hip surgery/IM nail, date of procedure, 03/15/15.  States Xrays have been done at [facility], and relays that family requests to not have patient transported to office for the appointment at this time, due to "not feeling well"; asking if you can possibly review the films and advise, and see him at a later date, or see him at the facility?  Please advise.  [facility] Ph [phone]

## 2015-04-01 NOTE — Telephone Encounter (Signed)
[place] per Dr Ruthe Mannan response; relayed to patient's nurse, Myrla Halsted; faxed note as well to her attention at fax#[phone].

## 2015-04-01 NOTE — Telephone Encounter (Signed)
Attempted to contact patient's family to notify directly regarding appointment cancellation per their request; automated message indicates no voice mail set up.

## 2015-04-05 ENCOUNTER — Ambulatory Visit: Payer: Medicare Other | Admitting: Orthopedic Surgery

## 2015-04-06 ENCOUNTER — Ambulatory Visit (HOSPITAL_COMMUNITY): Payer: Medicare Other | Admitting: Speech Pathology

## 2015-04-08 ENCOUNTER — Ambulatory Visit (HOSPITAL_COMMUNITY): Payer: Medicare Other | Admitting: Speech Pathology

## 2015-04-08 ENCOUNTER — Other Ambulatory Visit (HOSPITAL_COMMUNITY): Payer: Medicare Other

## 2015-04-08 ENCOUNTER — Encounter: Payer: Medicare Other | Admitting: Adult Health

## 2015-10-25 ENCOUNTER — Emergency Department (HOSPITAL_COMMUNITY)
Admission: EM | Admit: 2015-10-25 | Discharge: 2015-10-25 | Disposition: A | Discharge status: Home / Self Care | Payer: Medicare Other | Attending: Emergency Medicine | Admitting: Emergency Medicine

## 2015-10-25 ENCOUNTER — Encounter (HOSPITAL_COMMUNITY): Payer: Self-pay | Admitting: Emergency Medicine

## 2015-10-25 DIAGNOSIS — Z79899 Other long term (current) drug therapy: Secondary | ICD-10-CM | POA: Diagnosis not present

## 2015-10-25 DIAGNOSIS — Z7982 Long term (current) use of aspirin: Secondary | ICD-10-CM | POA: Insufficient documentation

## 2015-10-25 DIAGNOSIS — I10 Essential (primary) hypertension: Secondary | ICD-10-CM | POA: Insufficient documentation

## 2015-10-25 DIAGNOSIS — F17210 Nicotine dependence, cigarettes, uncomplicated: Secondary | ICD-10-CM | POA: Diagnosis not present

## 2015-10-25 DIAGNOSIS — H6123 Impacted cerumen, bilateral: Secondary | ICD-10-CM

## 2015-10-25 DIAGNOSIS — J449 Chronic obstructive pulmonary disease, unspecified: Secondary | ICD-10-CM | POA: Insufficient documentation

## 2015-10-25 DIAGNOSIS — I2510 Atherosclerotic heart disease of native coronary artery without angina pectoris: Secondary | ICD-10-CM | POA: Insufficient documentation

## 2015-10-25 DIAGNOSIS — F329 Major depressive disorder, single episode, unspecified: Secondary | ICD-10-CM | POA: Diagnosis not present

## 2015-10-25 MED ORDER — CARBAMIDE PEROXIDE 6.5 % OT SOLN
5.0000 [drp] | Freq: Once | OTIC | Status: AC
  Administered 2015-10-25: 5 [drp] via OTIC

## 2015-10-25 MED ORDER — CARBAMIDE PEROXIDE 6.5 % OT SOLN
OTIC | Status: AC
  Filled 2015-10-25: qty 15

## 2015-10-25 NOTE — ED Provider Notes (Signed)
CSN: [id number]     Arrival date & time 10/25/15  1054 History  By signing my name below, I, [name], attest that this documentation has been prepared under the direction and in the presence of Samuel Parkinson, PA-C Electronically Signed: Soijett [name], ED Scribe. 10/25/2015. 11:33 AM.    Chief Complaint  Patient presents with  . Cerumen Impaction       The history is provided by the patient. No language interpreter was used.    HPI Comments:  [name] is a 82 y.o. male  who presents to the Emergency Department complaining of cerumen impaction to right ear onset this morning. Pt son notes that after the pt took his bath this morning, he was complaining of muffled hearing to his right ear.  Family states this is a recurring problem.   He states that he has tried OTC ear wax removal with no relief for his symptoms. He denies ear pain/discharge, tinnitus, CP, SOB, dizziness, and any other symptoms. Pt is blind.     Past Medical History  Diagnosis Date  . Essential hypertension   . COPD (chronic obstructive pulmonary disease) ([place])   . History of stroke   . Glaucoma   . BPH (benign prostatic hyperplasia)   . Anxiety   . Depression   . Blindness - both eyes     Secondary to glaucoma  . GERD (gastroesophageal reflux disease)   . Headache(784.0)   . Aortic aneurysm ([place])   . CAD (coronary artery disease)     Multivessel status post CABG 1998  . PAD (peripheral artery disease) ([place])   . Carotid disease, bilateral ([place])   . Femoral artery stenosis ([place])   . SVT (supraventricular tachycardia) [facility])    Past Surgical History  Procedure Laterality Date  . Eye surgery      Stick removed from eye as child;deadened nerves in right eye age 46  . Cataract extraction Left 2009  . Eye surgery      Corneal ruptured and removed eye completely  . Cholecystectomy  20+ yrs ago  . Carotid endarterectomy Right   . Transurethral resection of prostate    . Transurethral resection of  prostate  08/23/2011    Procedure: TRANSURETHRAL RESECTION OF THE PROSTATE (TURP);  Surgeon: Marissa Nestle, MD;  Location: [facility];  Service: Urology;  Laterality: N/A;  . Intramedullary (im) nail intertrochanteric Right 03/15/2015    Procedure: OPEN TREATMENT INTERNAL FIXATION WITH INTRAMEDULLARY  NAIL RIGHT HIP;  Surgeon: Carole Civil, MD;  Location: [facility];  Service: Orthopedics;  Laterality: Right;  . Coronary artery bypass graft      1998 with a LIMA to the LAD, saphenousvein graft to the obtuse marginal and saphenous vein graft to the posterior descending branch   Family History  Problem Relation Age of Onset  . Anesthesia problems Neg Hx   . Hypotension Neg Hx   . Malignant hyperthermia Neg Hx   . Pseudochol deficiency Neg Hx   . Heart attack Brother     Deceased   . Heart attack Brother     Deceased   Social History  Substance Use Topics  . Smoking status: Current Every Day Smoker -- 1.50 packs/day for 50 years    Types: Cigarettes  . Smokeless tobacco: Never Used  . Alcohol Use: No    Review of Systems  HENT: Negative for ear discharge, ear pain and tinnitus.        Wax buildup in right ear with  muffled hearing  Respiratory: Negative for shortness of breath.   Cardiovascular: Negative for chest pain.  Neurological: Negative for dizziness.  All other systems reviewed and are negative.     Allergies  Codeine and Penicillins  Home Medications   Prior to Admission medications   Medication Sig Start Date End Date Taking? Authorizing Provider  acetaminophen (TYLENOL) 500 MG tablet Take 1,000 mg by mouth every 6 (six) hours as needed for mild pain.    Historical Provider, MD  aspirin EC 81 MG tablet Take 81 mg by mouth daily.    Historical Provider, MD  clopidogrel (PLAVIX) 75 MG tablet Take 75 mg by mouth daily.    Historical Provider, MD  ferrous sulfate 325 (65 FE) MG tablet Take 325 mg by mouth daily.      Historical Provider, MD  folic acid (FOLVITE) [id number]  MCG tablet Take 1,600 mcg by mouth daily.    Historical Provider, MD  levofloxacin (LEVAQUIN) 500 MG tablet Take 1 tablet (500 mg total) by mouth daily. 03/24/15   Angus McInnis, MD  lisinopril (PRINIVIL,ZESTRIL) 40 MG tablet Take 40 mg by mouth daily.    Historical Provider, MD  loperamide (IMODIUM) 2 MG capsule Take 1 capsule (2 mg total) by mouth as needed for diarrhea or loose stools. 03/24/15   [name], MD  metoprolol succinate (TOPROL-XL) 50 MG 24 hr tablet Take 50 mg by mouth daily. Take with or immediately following a meal.    Historical Provider, MD  simvastatin (ZOCOR) 80 MG tablet Take 40 mg by mouth daily. For cholesterol    Historical Provider, MD  zolpidem (AMBIEN) 5 MG tablet Take 1 tablet (5 mg total) by mouth at bedtime as needed for sleep. 03/24/15   Angus McInnis, MD   BP 190/91 mmHg  Pulse 86  Temp(Src) 97.8 F (36.6 C) (Oral)  Resp 18  Ht 5\' 9"  (1.753 m)  Wt 135 lb (61.236 kg)  BMI 19.93 kg/m2  SpO2 100% Physical Exam  Constitutional: He is oriented to person, place, and time. He appears well-developed and well-nourished. No distress.  HENT:  Head: Normocephalic and atraumatic.  Bilateral cerumen impaction. TMs not visualized.  Eyes: EOM are normal.  Neck: Neck supple.  Cardiovascular: Normal rate, regular rhythm and normal heart sounds.  Exam reveals no gallop and no friction rub.   No murmur heard. Pulmonary/Chest: Effort normal and breath sounds normal. No respiratory distress. He has no wheezes. He has no rales.  Abdominal: He exhibits no distension.  Musculoskeletal: Normal range of motion.  Neurological: He is alert and oriented to person, place, and time.  Skin: Skin is warm and dry.  Psychiatric: He has a normal mood and affect. His behavior is normal.  Nursing note and vitals reviewed.   ED Course  Procedures (including critical care time) DIAGNOSTIC STUDIES: Oxygen Saturation is 100% on RA, nl by my interpretation.    COORDINATION OF  CARE: 11:33 AM Discussed treatment plan with pt at bedside and pt agreed to plan.    Labs Review Labs Reviewed - No data to display  Imaging Review No results found.    EKG Interpretation None      MDM   Final diagnoses:  Cerumen impaction, bilateral    Pt is well appearing.  Bilateral cerumen impaction w/o other complaints.  Ambulates with steady gait. No focal neuro deficits.  Right ear irrigated and moderate amt of cerumen removed by me using a curette.  Pain and hearing improved. TM still not  well visualized due to cerumen but appears normal. Family agrees to PMD f/u if needed.   I personally performed the services described in this documentation, which was scribed in my presence. The recorded information has been reviewed and is accurate.    Samuel Parkinson, PA-C 10/27/15 1942  Noemi Chapel, MD 10/28/15 [phone]

## 2015-10-25 NOTE — ED Notes (Signed)
Wax in ears.  Denies pain.

## 2015-10-25 NOTE — Discharge Instructions (Signed)
Cerumen Impaction The structures of the external ear canal secrete a waxy substance known as cerumen. Excess cerumen can build up in the ear canal, causing a condition known as cerumen impaction. Cerumen impaction can cause ear pain and disrupt the function of the ear. The rate of cerumen production differs for each individual. In certain individuals, the configuration of the ear canal may decrease his or her ability to naturally remove cerumen. CAUSES Cerumen impaction is caused by excessive cerumen production or buildup. RISK FACTORS  Frequent use of swabs to clean ears.  Having narrow ear canals.  Having eczema.  Being dehydrated. SIGNS AND SYMPTOMS  Diminished hearing.  Ear drainage.  Ear pain.  Ear itch. TREATMENT Treatment may involve:  Over-the-counter or prescription ear drops to soften the cerumen.  Removal of cerumen by a health care provider. This may be done with:  Irrigation with warm water. This is the most common method of removal.  Ear curettes and other instruments.  Surgery. This may be done in severe cases. HOME CARE INSTRUCTIONS  Take medicines only as directed by your health care provider.  Do not insert objects into the ear with the intent of cleaning the ear. PREVENTION  Do not insert objects into the ear, even with the intent of cleaning the ear. Removing cerumen as a part of normal hygiene is not necessary, and the use of swabs in the ear canal is not recommended.  Drink enough water to keep your urine clear or pale yellow.  Control your eczema if you have it. SEEK MEDICAL CARE IF:  You develop ear pain.  You develop bleeding from the ear.  The cerumen does not clear after you use ear drops as directed.   This information is not intended to replace advice given to you by your health care provider. Make sure you discuss any questions you have with your health care provider.   Document Released: 08/03/2004 Document Revised: 07/17/2014  Document Reviewed: 02/10/2015 Elsevier Interactive Patient Education 2016 [employer] Inc[employer]  

## 2016-12-05 ENCOUNTER — Emergency Department (HOSPITAL_COMMUNITY)
Admission: EM | Admit: 2016-12-05 | Discharge: 2016-12-05 | Disposition: A | Discharge status: Home / Self Care | Payer: Medicare Other | Attending: Emergency Medicine | Admitting: Emergency Medicine

## 2016-12-05 ENCOUNTER — Encounter (HOSPITAL_COMMUNITY): Payer: Self-pay

## 2016-12-05 DIAGNOSIS — I2510 Atherosclerotic heart disease of native coronary artery without angina pectoris: Secondary | ICD-10-CM | POA: Diagnosis not present

## 2016-12-05 DIAGNOSIS — J449 Chronic obstructive pulmonary disease, unspecified: Secondary | ICD-10-CM | POA: Insufficient documentation

## 2016-12-05 DIAGNOSIS — H6122 Impacted cerumen, left ear: Secondary | ICD-10-CM | POA: Diagnosis not present

## 2016-12-05 DIAGNOSIS — F17210 Nicotine dependence, cigarettes, uncomplicated: Secondary | ICD-10-CM | POA: Diagnosis not present

## 2016-12-05 DIAGNOSIS — I10 Essential (primary) hypertension: Secondary | ICD-10-CM | POA: Insufficient documentation

## 2016-12-05 DIAGNOSIS — Z7982 Long term (current) use of aspirin: Secondary | ICD-10-CM | POA: Insufficient documentation

## 2016-12-05 DIAGNOSIS — Z7902 Long term (current) use of antithrombotics/antiplatelets: Secondary | ICD-10-CM | POA: Diagnosis not present

## 2016-12-05 DIAGNOSIS — Z79899 Other long term (current) drug therapy: Secondary | ICD-10-CM | POA: Diagnosis not present

## 2016-12-05 DIAGNOSIS — H938X2 Other specified disorders of left ear: Secondary | ICD-10-CM | POA: Diagnosis present

## 2016-12-05 MED ORDER — HYDROGEN PEROXIDE 3 % EX SOLN
CUTANEOUS | Status: AC
  Filled 2016-12-05: qty 473

## 2016-12-05 NOTE — ED Provider Notes (Signed)
[facility] DEPT Provider Note   CSN: [id number] Arrival date & time: 12/05/16  1016  By signing my name below, I, [name], attest that this documentation has been prepared under the direction and in the presence of No att. providers found. Electronically Signed: Mayer [name], Scribe. 12/05/16. 11:35 AM.  History   Chief Complaint Chief Complaint  Patient presents with  . ear stopped up   The history is provided by the patient. No language interpreter was used.    HPI Comments: [name] is a 83 y.o. male who presents to the Emergency Department complaining of constant, gradually worsening left ear clogging for a month. He has tried using ear drops with no relief. He denies other associated symptoms or injuries. He denies fever, CP, and cough.  Past Medical History:  Diagnosis Date  . Anxiety   . Aortic aneurysm ([place])   . Blindness - both eyes    Secondary to glaucoma  . BPH (benign prostatic hyperplasia)   . CAD (coronary artery disease)    Multivessel status post CABG 1998  . Carotid disease, bilateral ([place])   . COPD (chronic obstructive pulmonary disease) ([place])   . Depression   . Essential hypertension   . Femoral artery stenosis ([place])   . GERD (gastroesophageal reflux disease)   . Glaucoma   . Headache(784.0)   . History of stroke   . PAD (peripheral artery disease) ([place])   . SVT (supraventricular tachycardia) [facility])     Patient Active Problem List   Diagnosis Date Noted  . PAF (paroxysmal atrial fibrillation) ([place]) 03/19/2015  . Intertrochanteric fracture of right femur ([place]) 03/15/2015  . Hip fracture ([place]) 03/14/2015  . AAA (abdominal aortic aneurysm) without rupture ([place]) 03/14/2015  . HTN (hypertension) 03/14/2015  . Blindness 03/14/2015  . AAA (abdominal aortic aneurysm) ([place])   . On continuous oral anticoagulation   . Abdominal aneurysm without mention of rupture 12/12/2011  . ANEMIA, IRON DEFICIENCY, MICROCYTIC 03/17/2010  . HIATAL HERNIA  WITH REFLUX 03/17/2010  . DIVERTICULAR BLEEDING, HX OF 03/17/2010    Past Surgical History:  Procedure Laterality Date  . CAROTID ENDARTERECTOMY Right   . CATARACT EXTRACTION Left 2009  . CHOLECYSTECTOMY  20+ yrs ago  . CORONARY ARTERY BYPASS GRAFT     1998 with a LIMA to the LAD, saphenousvein graft to the obtuse marginal and saphenous vein graft to the posterior descending branch  . EYE SURGERY     Stick removed from eye as child;deadened nerves in right eye age 77  . EYE SURGERY     Corneal ruptured and removed eye completely  . INTRAMEDULLARY (IM) NAIL INTERTROCHANTERIC Right 03/15/2015   Procedure: OPEN TREATMENT INTERNAL FIXATION WITH INTRAMEDULLARY  NAIL RIGHT HIP;  Surgeon: Carole Civil, MD;  Location: [facility];  Service: Orthopedics;  Laterality: Right;  . TRANSURETHRAL RESECTION OF PROSTATE    . TRANSURETHRAL RESECTION OF PROSTATE  08/23/2011   Procedure: TRANSURETHRAL RESECTION OF THE PROSTATE (TURP);  Surgeon: Marissa Nestle, MD;  Location: [facility];  Service: Urology;  Laterality: N/A;       Home Medications    Prior to Admission medications   Medication Sig Start Date End Date Taking? Authorizing Provider  acetaminophen (TYLENOL) 500 MG tablet Take 1,000 mg by mouth every 6 (six) hours as needed for mild pain.    [provider]  aspirin EC 81 MG tablet Take 81 mg by mouth daily.    [provider]  clopidogrel (PLAVIX) 75 MG  tablet Take 75 mg by mouth daily.    [provider]  ferrous sulfate 325 (65 FE) MG tablet Take 325 mg by mouth daily.      [provider]  folic acid (FOLVITE) 929 MCG tablet Take 1,600 mcg by mouth daily.    [provider]  levofloxacin (LEVAQUIN) 500 MG tablet Take 1 tablet (500 mg total) by mouth daily. 03/24/15   [name], MD  lisinopril (PRINIVIL,ZESTRIL) 40 MG tablet Take 40 mg by mouth daily.    [provider]  loperamide (IMODIUM) 2 MG capsule Take 1 capsule (2 mg total)  by mouth as needed for diarrhea or loose stools. 03/24/15   [name], MD  metoprolol succinate (TOPROL-XL) 50 MG 24 hr tablet Take 50 mg by mouth daily. Take with or immediately following a meal.    [provider]  simvastatin (ZOCOR) 80 MG tablet Take 40 mg by mouth daily. For cholesterol    [provider]  zolpidem (AMBIEN) 5 MG tablet Take 1 tablet (5 mg total) by mouth at bedtime as needed for sleep. 03/24/15   [name], MD    Family History Family History  Problem Relation Age of Onset  . Heart attack Brother        Deceased   . Heart attack Brother        Deceased  . Anesthesia problems Neg Hx   . Hypotension Neg Hx   . Malignant hyperthermia Neg Hx   . Pseudochol deficiency Neg Hx     Social History Social History  Substance Use Topics  . Smoking status: Current Every Day Smoker    Packs/day: 1.50    Years: 50.00    Types: Cigarettes  . Smokeless tobacco: Never Used  . Alcohol use No     Allergies   Codeine and Penicillins   Review of Systems Review of Systems  Constitutional: Negative for fever.  Eyes:       Left ear clog  Respiratory: Negative for cough.   Cardiovascular: Negative for chest pain.  All other systems reviewed and are negative.    Physical Exam Updated Vital Signs BP (!) 195/87 (BP Location: Right Arm)   Pulse 89   Temp 98.1 F (36.7 C) (Oral)   Resp 20   Wt 63.5 kg (140 lb)   SpO2 98%   BMI 20.67 kg/m   Physical Exam  Constitutional: He is oriented to person, place, and time. He appears well-developed and well-nourished.  HENT:  Head: Normocephalic and atraumatic.  Cerumen impaction in left TM  Moderate cerumen on right ear   Cardiovascular: Normal rate.   Pulmonary/Chest: Effort normal.  Neurological: He is alert and oriented to person, place, and time.  Skin: Skin is warm and dry.  Psychiatric: He has a normal mood and affect.  Nursing note and vitals reviewed.    ED Treatments / Results   DIAGNOSTIC STUDIES: Oxygen Saturation is 98% on RA, normal by my interpretation.    COORDINATION OF CARE: 11:35 AM Discussed treatment plan with pt at bedside and pt agreed to plan.   Labs (all labs ordered are listed, but only abnormal results are displayed) Labs Reviewed - No data to display  EKG  EKG Interpretation  Date/Time:  Tuesday Dec 05 2016 10:40:24 EDT Ventricular Rate:  89 PR Interval:  136 QRS Duration: 92 QT Interval:  416 QTC Calculation: 506 R Axis:   73 Text Interpretation:  Sinus rhythm with frequent Premature ventricular complexes in  a pattern of bigeminy Nonspecific ST abnormality Prolonged QT Abnormal ECG Confirmed by Reather Converse MD, Sahid Borba [phone]) on 12/05/2016 12:09:02 PM       Radiology No results found.  Procedures Procedures (including critical care time) Cerumen impaction left ear Curett in addition to flush warm water and peroxide. Mild improvement  Medications Ordered in ED Medications  hydrogen peroxide 3 % external solution (not administered)     Initial Impression / Assessment and Plan / ED Course  I have reviewed the triage vital signs and the nursing notes.  Pertinent labs & imaging results that were available during my care of the patient were reviewed by me and considered in my medical decision making (see chart for details).    Patient presents with cerumen impaction. EKG done in triage due to possible abnormal heart rate. Repeat heart rate normal. EKG bigeminy and patient stable for outpatient follow-up for that. Curet and flush used for impaction.  Results and differential diagnosis were discussed with the patient/parent/guardian. Xrays were independently reviewed by myself.  Close follow up outpatient was discussed, comfortable with the plan.   Medications  hydrogen peroxide 3 % external solution (not administered)    Vitals:   12/05/16 1038 12/05/16 1039 12/05/16 1042  BP: (!) 195/87    Pulse: (!) 44  89  Resp: 20      Temp: 98.1 F (36.7 C)    TempSrc: Oral    SpO2: 98%    Weight:  63.5 kg (140 lb)     Final diagnoses:  Hearing loss due to cerumen impaction, left     Final Clinical Impressions(s) / ED Diagnoses   Final diagnoses:  Hearing loss due to cerumen impaction, left    New Prescriptions Discharge Medication List as of 12/05/2016 12:03 PM        Elnora Morrison, MD 12/05/16 1355

## 2016-12-05 NOTE — ED Notes (Signed)
Pt's HR with pulse ox was reading 44.  EKG performed and shows HR 89.

## 2016-12-05 NOTE — Discharge Instructions (Signed)
Continue drops as needed. See ENT if no improvement.

## 2016-12-05 NOTE — ED Triage Notes (Signed)
Pt c/o left ear being stopped up for approx 1 week.  Denies pain

## 2017-12-26 ENCOUNTER — Inpatient Hospital Stay (HOSPITAL_COMMUNITY)
Admission: EM | Admit: 2017-12-26 | Discharge: 2017-12-31 | DRG: 301 | Disposition: A | Discharge status: Hospice | Payer: Medicare Other | Source: Other Acute Inpatient Hospital | Attending: Internal Medicine | Admitting: Internal Medicine

## 2017-12-26 ENCOUNTER — Emergency Department (HOSPITAL_COMMUNITY): Payer: Medicare Other

## 2017-12-26 ENCOUNTER — Other Ambulatory Visit: Payer: Self-pay

## 2017-12-26 ENCOUNTER — Encounter (HOSPITAL_COMMUNITY): Payer: Self-pay

## 2017-12-26 DIAGNOSIS — R51 Headache: Secondary | ICD-10-CM | POA: Diagnosis not present

## 2017-12-26 DIAGNOSIS — I714 Abdominal aortic aneurysm, without rupture, unspecified: Secondary | ICD-10-CM

## 2017-12-26 DIAGNOSIS — H543 Unqualified visual loss, both eyes: Secondary | ICD-10-CM | POA: Diagnosis present

## 2017-12-26 DIAGNOSIS — G4700 Insomnia, unspecified: Secondary | ICD-10-CM | POA: Diagnosis present

## 2017-12-26 DIAGNOSIS — Z9049 Acquired absence of other specified parts of digestive tract: Secondary | ICD-10-CM

## 2017-12-26 DIAGNOSIS — K591 Functional diarrhea: Secondary | ICD-10-CM | POA: Diagnosis not present

## 2017-12-26 DIAGNOSIS — I2510 Atherosclerotic heart disease of native coronary artery without angina pectoris: Secondary | ICD-10-CM | POA: Diagnosis present

## 2017-12-26 DIAGNOSIS — K921 Melena: Secondary | ICD-10-CM | POA: Diagnosis not present

## 2017-12-26 DIAGNOSIS — F329 Major depressive disorder, single episode, unspecified: Secondary | ICD-10-CM | POA: Diagnosis present

## 2017-12-26 DIAGNOSIS — Z515 Encounter for palliative care: Secondary | ICD-10-CM | POA: Diagnosis not present

## 2017-12-26 DIAGNOSIS — I700 Atherosclerosis of aorta: Secondary | ICD-10-CM | POA: Diagnosis present

## 2017-12-26 DIAGNOSIS — Z8673 Personal history of transient ischemic attack (TIA), and cerebral infarction without residual deficits: Secondary | ICD-10-CM

## 2017-12-26 DIAGNOSIS — Z951 Presence of aortocoronary bypass graft: Secondary | ICD-10-CM

## 2017-12-26 DIAGNOSIS — H4089 Other specified glaucoma: Secondary | ICD-10-CM | POA: Diagnosis present

## 2017-12-26 DIAGNOSIS — J449 Chronic obstructive pulmonary disease, unspecified: Secondary | ICD-10-CM | POA: Diagnosis present

## 2017-12-26 DIAGNOSIS — I480 Paroxysmal atrial fibrillation: Secondary | ICD-10-CM | POA: Diagnosis present

## 2017-12-26 DIAGNOSIS — Z66 Do not resuscitate: Secondary | ICD-10-CM | POA: Diagnosis present

## 2017-12-26 DIAGNOSIS — K219 Gastro-esophageal reflux disease without esophagitis: Secondary | ICD-10-CM | POA: Diagnosis present

## 2017-12-26 DIAGNOSIS — K922 Gastrointestinal hemorrhage, unspecified: Secondary | ICD-10-CM | POA: Diagnosis present

## 2017-12-26 DIAGNOSIS — Z8249 Family history of ischemic heart disease and other diseases of the circulatory system: Secondary | ICD-10-CM

## 2017-12-26 DIAGNOSIS — Z7902 Long term (current) use of antithrombotics/antiplatelets: Secondary | ICD-10-CM

## 2017-12-26 DIAGNOSIS — E785 Hyperlipidemia, unspecified: Secondary | ICD-10-CM | POA: Diagnosis present

## 2017-12-26 DIAGNOSIS — F17210 Nicotine dependence, cigarettes, uncomplicated: Secondary | ICD-10-CM | POA: Diagnosis present

## 2017-12-26 DIAGNOSIS — Z9842 Cataract extraction status, left eye: Secondary | ICD-10-CM

## 2017-12-26 DIAGNOSIS — Z9079 Acquired absence of other genital organ(s): Secondary | ICD-10-CM

## 2017-12-26 DIAGNOSIS — I739 Peripheral vascular disease, unspecified: Secondary | ICD-10-CM | POA: Diagnosis present

## 2017-12-26 DIAGNOSIS — N400 Enlarged prostate without lower urinary tract symptoms: Secondary | ICD-10-CM | POA: Diagnosis present

## 2017-12-26 DIAGNOSIS — D649 Anemia, unspecified: Secondary | ICD-10-CM | POA: Diagnosis present

## 2017-12-26 DIAGNOSIS — Z7982 Long term (current) use of aspirin: Secondary | ICD-10-CM

## 2017-12-26 DIAGNOSIS — I10 Essential (primary) hypertension: Secondary | ICD-10-CM | POA: Diagnosis present

## 2017-12-26 DIAGNOSIS — Z880 Allergy status to penicillin: Secondary | ICD-10-CM

## 2017-12-26 DIAGNOSIS — F419 Anxiety disorder, unspecified: Secondary | ICD-10-CM | POA: Diagnosis present

## 2017-12-26 DIAGNOSIS — Z7189 Other specified counseling: Secondary | ICD-10-CM | POA: Diagnosis not present

## 2017-12-26 DIAGNOSIS — Z79899 Other long term (current) drug therapy: Secondary | ICD-10-CM

## 2017-12-26 DIAGNOSIS — Z885 Allergy status to narcotic agent status: Secondary | ICD-10-CM

## 2017-12-26 DIAGNOSIS — Z9001 Acquired absence of eye: Secondary | ICD-10-CM

## 2017-12-26 DIAGNOSIS — R197 Diarrhea, unspecified: Secondary | ICD-10-CM | POA: Diagnosis present

## 2017-12-26 HISTORY — DX: Abdominal aortic aneurysm, without rupture, unspecified: I71.40

## 2017-12-26 HISTORY — DX: Abdominal aortic aneurysm, without rupture: I71.4

## 2017-12-26 LAB — TYPE AND SCREEN
ABO/RH(D): O POS
Antibody Screen: NEGATIVE

## 2017-12-26 LAB — CBC WITH DIFFERENTIAL/PLATELET
Basophils Absolute: 0 10*3/uL (ref 0.0–0.1)
Basophils Relative: 0 %
EOS PCT: 2 %
Eosinophils Absolute: 0.3 10*3/uL (ref 0.0–0.7)
HEMATOCRIT: 29.5 % — AB (ref 39.0–52.0)
Hemoglobin: 10.1 g/dL — ABNORMAL LOW (ref 13.0–17.0)
LYMPHS ABS: 0.8 10*3/uL (ref 0.7–4.0)
Lymphocytes Relative: 6 %
MCH: 35.4 pg — AB (ref 26.0–34.0)
MCHC: 34.2 g/dL (ref 30.0–36.0)
MCV: 103.5 fL — AB (ref 78.0–100.0)
MONOS PCT: 5 %
Monocytes Absolute: 0.7 10*3/uL (ref 0.1–1.0)
NEUTROS ABS: 11.3 10*3/uL — AB (ref 1.7–7.7)
Neutrophils Relative %: 87 %
Platelets: 116 10*3/uL — ABNORMAL LOW (ref 150–400)
RBC: 2.85 MIL/uL — ABNORMAL LOW (ref 4.22–5.81)
RDW: 16.1 % — AB (ref 11.5–15.5)
WBC: 13.1 10*3/uL — ABNORMAL HIGH (ref 4.0–10.5)

## 2017-12-26 LAB — CBC
HEMATOCRIT: 30 % — AB (ref 39.0–52.0)
Hemoglobin: 10.3 g/dL — ABNORMAL LOW (ref 13.0–17.0)
MCH: 34.4 pg — AB (ref 26.0–34.0)
MCHC: 34.3 g/dL (ref 30.0–36.0)
MCV: 100.3 fL — AB (ref 78.0–100.0)
Platelets: 125 10*3/uL — ABNORMAL LOW (ref 150–400)
RBC: 2.99 MIL/uL — AB (ref 4.22–5.81)
RDW: 14.9 % (ref 11.5–15.5)
WBC: 11.8 10*3/uL — AB (ref 4.0–10.5)

## 2017-12-26 LAB — COMPREHENSIVE METABOLIC PANEL
ALBUMIN: 3.4 g/dL — AB (ref 3.5–5.0)
ALT: 44 U/L (ref 17–63)
AST: 96 U/L — AB (ref 15–41)
Alkaline Phosphatase: 96 U/L (ref 38–126)
Anion gap: 7 (ref 5–15)
BILIRUBIN TOTAL: 0.8 mg/dL (ref 0.3–1.2)
BUN: 20 mg/dL (ref 6–20)
CHLORIDE: 107 mmol/L (ref 101–111)
CO2: 24 mmol/L (ref 22–32)
Calcium: 8.8 mg/dL — ABNORMAL LOW (ref 8.9–10.3)
Creatinine, Ser: 1.1 mg/dL (ref 0.61–1.24)
GFR calc Af Amer: >60 mL/min (ref >60)
GFR calc non Af Amer: 58 mL/min — ABNORMAL LOW (ref >60)
GLUCOSE: 95 mg/dL (ref 65–99)
POTASSIUM: 4.1 mmol/L (ref 3.5–5.1)
Sodium: 138 mmol/L (ref 135–145)
Total Protein: 6.2 g/dL — ABNORMAL LOW (ref 6.5–8.1)

## 2017-12-26 LAB — PROTIME-INR
INR: 1.26
PROTHROMBIN TIME: 15.7 s — AB (ref 11.4–15.2)

## 2017-12-26 LAB — I-STAT CG4 LACTIC ACID, ED: Lactic Acid, Venous: 1.48 mmol/L (ref 0.5–1.9)

## 2017-12-26 LAB — POC OCCULT BLOOD, ED: Fecal Occult Bld: POSITIVE — AB

## 2017-12-26 MED ORDER — SODIUM CHLORIDE 0.9% FLUSH
3.0000 mL | Freq: Two times a day (BID) | INTRAVENOUS | Status: DC
  Administered 2017-12-26 – 2017-12-31 (×10): 3 mL via INTRAVENOUS

## 2017-12-26 MED ORDER — LOPERAMIDE HCL 2 MG PO CAPS: 4.0000 mg | ORAL_CAPSULE | ORAL | Status: DC | PRN

## 2017-12-26 MED ORDER — MORPHINE SULFATE (PF) 4 MG/ML IV SOLN
2.0000 mg | INTRAVENOUS | Status: DC | PRN
  Administered 2017-12-26 – 2017-12-28 (×6): 2 mg via INTRAVENOUS
  Filled 2017-12-26 (×6): qty 1

## 2017-12-26 MED ORDER — ACETAMINOPHEN 325 MG PO TABS
650.0000 mg | ORAL_TABLET | Freq: Four times a day (QID) | ORAL | Status: DC | PRN
  Administered 2017-12-26 – 2017-12-30 (×6): 650 mg via ORAL
  Filled 2017-12-26 (×6): qty 2

## 2017-12-26 MED ORDER — ALUM & MAG HYDROXIDE-SIMETH 200-200-20 MG/5ML PO SUSP
15.0000 mL | ORAL | Status: DC | PRN
  Administered 2017-12-26: 15 mL via ORAL
  Filled 2017-12-26: qty 30

## 2017-12-26 MED ORDER — METOPROLOL SUCCINATE ER 50 MG PO TB24
50.0000 mg | ORAL_TABLET | Freq: Every day | ORAL | Status: DC
  Administered 2017-12-26 – 2017-12-31 (×6): 50 mg via ORAL
  Filled 2017-12-26: qty 2
  Filled 2017-12-26 (×5): qty 1

## 2017-12-26 MED ORDER — LORAZEPAM 1 MG PO TABS
1.0000 mg | ORAL_TABLET | Freq: Once | ORAL | Status: AC
  Administered 2017-12-26: 1 mg via ORAL
  Filled 2017-12-26: qty 1

## 2017-12-26 MED ORDER — ATORVASTATIN CALCIUM 40 MG PO TABS
40.0000 mg | ORAL_TABLET | Freq: Every day | ORAL | Status: DC
  Administered 2017-12-26 – 2017-12-28 (×3): 40 mg via ORAL
  Filled 2017-12-26 (×4): qty 1

## 2017-12-26 MED ORDER — ZOLPIDEM TARTRATE 5 MG PO TABS
5.0000 mg | ORAL_TABLET | Freq: Every evening | ORAL | Status: DC | PRN
  Administered 2017-12-29: 5 mg via ORAL
  Filled 2017-12-26 (×2): qty 1

## 2017-12-26 MED ORDER — NICOTINE 14 MG/24HR TD PT24
14.0000 mg | MEDICATED_PATCH | Freq: Once | TRANSDERMAL | Status: AC
  Administered 2017-12-26: 14 mg via TRANSDERMAL
  Filled 2017-12-26: qty 1

## 2017-12-26 MED ORDER — FERROUS SULFATE 325 (65 FE) MG PO TABS
325.0000 mg | ORAL_TABLET | Freq: Every day | ORAL | Status: DC
  Administered 2017-12-26 – 2017-12-31 (×6): 325 mg via ORAL
  Filled 2017-12-26 (×6): qty 1

## 2017-12-26 MED ORDER — ONDANSETRON HCL 4 MG/2ML IJ SOLN
4.0000 mg | Freq: Four times a day (QID) | INTRAMUSCULAR | Status: DC | PRN
Start: 2017-12-26 — End: 2017-12-31
  Administered 2017-12-26 – 2017-12-28 (×2): 4 mg via INTRAVENOUS
  Filled 2017-12-26 (×2): qty 2

## 2017-12-26 MED ORDER — ACETAMINOPHEN 650 MG RE SUPP: 650.0000 mg | Freq: Four times a day (QID) | RECTAL | Status: DC | PRN

## 2017-12-26 MED ORDER — PANTOPRAZOLE SODIUM 40 MG IV SOLR
40.0000 mg | Freq: Two times a day (BID) | INTRAVENOUS | Status: DC
  Administered 2017-12-26 – 2017-12-28 (×5): 40 mg via INTRAVENOUS
  Filled 2017-12-26 (×6): qty 40

## 2017-12-26 MED ORDER — LACTATED RINGERS IV SOLN
INTRAVENOUS | Status: AC
  Administered 2017-12-26 – 2017-12-29 (×6): via INTRAVENOUS

## 2017-12-26 MED ORDER — ONDANSETRON HCL 4 MG PO TABS: 4.0000 mg | ORAL_TABLET | Freq: Four times a day (QID) | ORAL | Status: DC | PRN

## 2017-12-26 NOTE — ED Notes (Signed)
Clear Liquid Diet was ordered for Lunch. 

## 2017-12-26 NOTE — ED Notes (Signed)
Got patient on the monitor patient is resting with call bell in reach  ?

## 2017-12-26 NOTE — H&P (Signed)
History and Physical    [name] NWG:[id number] DOB: 05-20-1929 DOA: 12/26/2017  PCP: Jani Gravel, MD Consultants:  None Patient coming from:  Home - lives with son; Carlus Pavlov, [phone]  Chief Complaint:  abdominal pain  HPI: [name] is a 84 y.o. male with medical history significant of SVT; PAD; CVA; HTN; depression; COPD; CAD; blindness; and AAA presenting with N/V/D.  He forgot what has been happening.  He denies abdominal pain and n/v at this time.  He denies any problems at this time. He is having dark stools concerning for blood.  At the time of my evaluation, he had no c/o pain and no TTP; however, when I spoke with his granddaughter several hours later she reported that he was having recurrent stools and abdominal pain.   ED Course:  Seen at [facility] with n/v/d and elevated lactate, AAA 9.7 cm.  Arranged transfer to see Dr. Trula Slade.  Previously refused intervention.  Probably not a surgical candidate.  Suggest serial abdominal exams and palliative consult.  Lactate has cleared.  Review of Systems: As per HPI; otherwise review of systems reviewed and negative.   Ambulatory Status:  Ambulates with a walker  Past Medical History:  Diagnosis Date  . AAA (abdominal aortic aneurysm) without rupture ([place]) 12/26/2017  . Anxiety   . Aortic aneurysm ([place])   . Blindness - both eyes    Secondary to glaucoma  . BPH (benign prostatic hyperplasia)   . CAD (coronary artery disease)    Multivessel status post CABG 1998  . Carotid disease, bilateral ([place])   . COPD (chronic obstructive pulmonary disease) ([place])   . Depression   . Essential hypertension   . Femoral artery stenosis ([place])   . GERD (gastroesophageal reflux disease)   . Glaucoma   . Headache(784.0)   . History of stroke   . PAD (peripheral artery disease) ([place])   . SVT (supraventricular tachycardia) (HCC)     Past Surgical History:  Procedure Laterality Date  . CAROTID ENDARTERECTOMY Right   . CATARACT  EXTRACTION Left 2009  . CHOLECYSTECTOMY  20+ yrs ago  . CORONARY ARTERY BYPASS GRAFT     1998 with a LIMA to the LAD, saphenousvein graft to the obtuse marginal and saphenous vein graft to the posterior descending branch  . EYE SURGERY     Stick removed from eye as child;deadened nerves in right eye age 34  . EYE SURGERY     Corneal ruptured and removed eye completely  . INTRAMEDULLARY (IM) NAIL INTERTROCHANTERIC Right 03/15/2015   Procedure: OPEN TREATMENT INTERNAL FIXATION WITH INTRAMEDULLARY  NAIL RIGHT HIP;  Surgeon: Carole Civil, MD;  Location: [facility];  Service: Orthopedics;  Laterality: Right;  . TRANSURETHRAL RESECTION OF PROSTATE    . TRANSURETHRAL RESECTION OF PROSTATE  08/23/2011   Procedure: TRANSURETHRAL RESECTION OF THE PROSTATE (TURP);  Surgeon: Marissa Nestle, MD;  Location: [facility];  Service: Urology;  Laterality: N/A;    Social History   Socioeconomic History  . Marital status: Widowed    Spouse name: Not on file  . Number of children: Not on file  . Years of education: Not on file  . Highest education level: Not on file  Occupational History  . Occupation: [employer]    Comment: Retired  Scientific laboratory technician  . Financial resource strain: Not on file  . Food insecurity:    Worry: Not on file    Inability: Not on file  . Transportation needs:  Medical: Not on file    Non-medical: Not on file  Tobacco Use  . Smoking status: Current Every Day Smoker    Packs/day: 1.50    Years: 55.00    Pack years: 82.50    Types: Cigarettes  . Smokeless tobacco: Never Used  Substance and Sexual Activity  . Alcohol use: No  . Drug use: No  . Sexual activity: Not on file  Lifestyle  . Physical activity:    Days per week: Not on file    Minutes per session: Not on file  . Stress: Not on file  Relationships  . Social connections:    Talks on phone: Not on file    Gets together: Not on file    Attends religious service: Not on file    Active member of club or  organization: Not on file    Attends meetings of clubs or organizations: Not on file    Relationship status: Not on file  . Intimate partner violence:    Fear of current or ex partner: Not on file    Emotionally abused: Not on file    Physically abused: Not on file    Forced sexual activity: Not on file  Other Topics Concern  . Not on file  Social History Narrative  . Not on file    Allergies  Allergen Reactions  . Ambien [Zolpidem Tartrate] Other (See Comments)    Severe nightmares  . Penicillins Hives    Has patient had a PCN reaction causing immediate rash, facial/tongue/throat swelling, SOB or lightheadedness with hypotension: Unknown Has patient had a PCN reaction causing severe rash involving mucus membranes or skin necrosis: Unknown Has patient had a PCN reaction that required hospitalization: Unknown Has patient had a PCN reaction occurring within the last 10 years: Yes If all of the above answers are "NO", then may proceed with Cephalosporin use.   . Codeine Hives and Palpitations    Family History  Problem Relation Age of Onset  . Heart attack Brother        Deceased   . Heart attack Brother        Deceased  . Anesthesia problems Neg Hx   . Hypotension Neg Hx   . Malignant hyperthermia Neg Hx   . Pseudochol deficiency Neg Hx     Prior to Admission medications   Medication Sig Start Date End Date Taking? Authorizing Provider  acetaminophen (TYLENOL) 500 MG tablet Take 1,000 mg by mouth every 6 (six) hours as needed for mild pain.    [provider]  aspirin EC 81 MG tablet Take 81 mg by mouth daily.    [provider]  clopidogrel (PLAVIX) 75 MG tablet Take 75 mg by mouth daily.    [provider]  ferrous sulfate 325 (65 FE) MG tablet Take 325 mg by mouth daily.      [provider]  folic acid (FOLVITE) 616 MCG tablet Take 1,600 mcg by mouth daily.    [provider]  levofloxacin (LEVAQUIN) 500 MG tablet Take 1  tablet (500 mg total) by mouth daily. 03/24/15   [name], MD  lisinopril (PRINIVIL,ZESTRIL) 40 MG tablet Take 40 mg by mouth daily.    [provider]  loperamide (IMODIUM) 2 MG capsule Take 1 capsule (2 mg total) by mouth as needed for diarrhea or loose stools. 03/24/15   [name], MD  metoprolol succinate (TOPROL-XL) 50 MG 24 hr tablet Take 50 mg by mouth daily. Take with  or immediately following a meal.    [provider]  simvastatin (ZOCOR) 80 MG tablet Take 40 mg by mouth daily. For cholesterol    [provider]  zolpidem (AMBIEN) 5 MG tablet Take 1 tablet (5 mg total) by mouth at bedtime as needed for sleep. 03/24/15   [name], MD    Physical Exam: Vitals:   12/26/17 1300 12/26/17 1330 12/26/17 1430 12/26/17 1514  BP: 135/68 (!) 161/89 133/68 131/85  Pulse: 87 96 76 85  Resp: (!) 22 20 18  (!) 24  Temp:    98.2 F (36.8 C)  TempSrc:    Oral  SpO2: 94% 98% 96% 100%  Weight:      Height:         General:   Appears calm and comfortable and is NAD Eyes:  He is blind and does not open his eyes ENT:  Hard of hearing, lips & tongue, mmm Neck:  no LAD, masses or thyromegaly; no carotid bruits Cardiovascular:  RRR, no m/r/g. No LE edema.  Respiratory:   CTA bilaterally with no wheezes/rales/rhonchi.  Normal respiratory effort. Abdomen:  soft, NT, ND, NABS; he has an extremely large and pulsatile mass in his abdomen Back:   normal alignment, no CVAT Skin:  no rash or induration seen on limited exam Musculoskeletal:  grossly normal tone BUE/BLE, good ROM, no bony abnormality Lower extremity:  No LE edema.  Limited foot exam with no ulcerations.  2+ distal pulses. Psychiatric:  grossly normal mood and affect, speech fluent and appropriate Neurologic:  CN 2-12 grossly intact, moves all extremities in coordinated fashion, sensation intact    Radiological Exams on Admission: Dg Chest Port 1 View  Result Date: 12/26/2017 CLINICAL DATA:   Headache and chest pain for 3 days. EXAM: PORTABLE CHEST 1 VIEW COMPARISON:  03/31/2015 FINDINGS: Postoperative changes in the mediastinum. Heart size and pulmonary vascularity are normal. Lungs appear clear and expanded. No blunting of costophrenic angles. No pneumothorax. Calcification of the aorta. Degenerative changes in the spine and shoulders. IMPRESSION: No evidence of active pulmonary disease.  Aortic atherosclerosis. Electronically Signed   By: Lucienne Capers M.D.   On: 12/26/2017 05:53    EKG: Independently reviewed.  Sinus tachycardia with rate 105; nonspecific ST changes with no evidence of acute ischemia   Labs on Admission: I have personally reviewed the available labs and imaging studies at the time of the admission.  Pertinent labs:   Lactate 1.48 Unremarkable CMP WBC 13.1 Hgb 10.1 Platelets 116 INR 1.26 Heme positive  Assessment/Plan Active Problems:   AAA (abdominal aortic aneurysm) without rupture (HCC)   HTN (hypertension)   GI bleeding   -Patient presenting as an ER:ER transfer from [facility] for AAA -He presented with abdominal pain -He is having tarry stools that are heme positive -He has had progression in growth of his AAA and it is now 9.7 cm. -Dr. Trula Slade evaluated the patient (according to Dr. Raechel Chute note; however, there is no actual note from Dr. Trula Slade) and he is not a surgical candidate. -As such, the patient appears to have a terminal diagnosis - it is not possible to predict when the AAA will rupture, but it is likely to do so. -I have discussed the situation with the patient; his son (by telephone); and his granddaughter (in person later in the day). -The patient voiced understanding with the situation and reported that he prefers to be comfortable and desires comfort measures.  He is willing to proceed with a  hospice consult. -I then called and discussed the situation with his son.  His son requested time to speak with his sister to ensure that  they were in agreement with the plan for hospice. -His granddaughter agrees that this is the most appropriate plan and will attempt to speak with her father about the plan, as well. -With no firm family agreement on the plan to transition to comfort measures only, for now will admit to telemetry and await a family decision.  If they have not come to an agreement tomorrow, suggest palliative care consultation.  If they are in agreement with hospice, suggest consideration of transition to [facility] as he does appear to be a residential hospice candidate. -For now, he has GI bleeding but does not appear to be a candidate for intervention.   -Will order Protonix 40 mg IV BID. -Will check CBC and BMP again in the AM.  -Will give morphine prn pain. -Will continue FeSO4, Toprol XL, and statin (change Zocor to Lipitor) for now.  Hold other home medications. -Poor prognosis, which has been explained to the patient and multiple family members, as above.     DVT prophylaxis:  SCDs Code Status:  DNR - confirmed with patient Family Communication: Spoke with son by telephone and granddaughter in person Disposition Plan:  Probable in-hospital death vs. Residential hospice Consults called: Vascular surgery (by ER); likely to need palliative care or hospice consultation in AM Admission status: Admit - It is my clinical opinion that admission to INPATIENT is reasonable and necessary because of the expectation that this patient will require hospital care that crosses at least 2 midnights to treat this condition based on the medical complexity of the problems presented.  Given the aforementioned information, the predictability of an adverse outcome is felt to be significant.    Karmen Bongo MD [facility]  If note is complete, please contact covering daytime or nighttime physician. [email or web address] Password [facility]  12/26/2017, 5:42 PM

## 2017-12-26 NOTE — ED Provider Notes (Signed)
[facility] EMERGENCY DEPARTMENT Provider Note   CSN: [id number] Arrival date & time: 12/26/17  6812     History   Chief Complaint Chief Complaint  Patient presents with  . AAA    HPI CAN LUCCI is a 84 y.o. male.  The history is provided by the patient and the EMS personnel.  Abdominal Pain   This is a new problem. The current episode started more than 2 days ago. The problem occurs daily. The problem has been resolved. The pain is located in the generalized abdominal region. Associated symptoms include diarrhea, nausea and vomiting. Pertinent negatives include fever. Nothing aggravates the symptoms. Nothing relieves the symptoms.  Patient reports onset of vomiting diarrhea and abdominal pain over 2 days ago.  He was seen in outside facility and sent here for further evaluation.  He reports all the symptoms are resolved at this time.  Past Medical History:  Diagnosis Date  . Anxiety   . Aortic aneurysm ([place])   . Blindness - both eyes    Secondary to glaucoma  . BPH (benign prostatic hyperplasia)   . CAD (coronary artery disease)    Multivessel status post CABG 1998  . Carotid disease, bilateral ([place])   . COPD (chronic obstructive pulmonary disease) ([place])   . Depression   . Essential hypertension   . Femoral artery stenosis ([place])   . GERD (gastroesophageal reflux disease)   . Glaucoma   . Headache(784.0)   . History of stroke   . PAD (peripheral artery disease) ([place])   . SVT (supraventricular tachycardia) [facility])     Patient Active Problem List   Diagnosis Date Noted  . PAF (paroxysmal atrial fibrillation) ([place]) 03/19/2015  . Intertrochanteric fracture of right femur ([place]) 03/15/2015  . Hip fracture ([place]) 03/14/2015  . AAA (abdominal aortic aneurysm) without rupture ([place]) 03/14/2015  . HTN (hypertension) 03/14/2015  . Blindness 03/14/2015  . AAA (abdominal aortic aneurysm) ([place])   . On continuous oral anticoagulation   . Abdominal aneurysm  without mention of rupture 12/12/2011  . ANEMIA, IRON DEFICIENCY, MICROCYTIC 03/17/2010  . HIATAL HERNIA WITH REFLUX 03/17/2010  . DIVERTICULAR BLEEDING, HX OF 03/17/2010    Past Surgical History:  Procedure Laterality Date  . CAROTID ENDARTERECTOMY Right   . CATARACT EXTRACTION Left 2009  . CHOLECYSTECTOMY  20+ yrs ago  . CORONARY ARTERY BYPASS GRAFT     1998 with a LIMA to the LAD, saphenousvein graft to the obtuse marginal and saphenous vein graft to the posterior descending branch  . EYE SURGERY     Stick removed from eye as child;deadened nerves in right eye age 40  . EYE SURGERY     Corneal ruptured and removed eye completely  . INTRAMEDULLARY (IM) NAIL INTERTROCHANTERIC Right 03/15/2015   Procedure: OPEN TREATMENT INTERNAL FIXATION WITH INTRAMEDULLARY  NAIL RIGHT HIP;  Surgeon: Carole Civil, MD;  Location: [facility];  Service: Orthopedics;  Laterality: Right;  . TRANSURETHRAL RESECTION OF PROSTATE    . TRANSURETHRAL RESECTION OF PROSTATE  08/23/2011   Procedure: TRANSURETHRAL RESECTION OF THE PROSTATE (TURP);  Surgeon: Marissa Nestle, MD;  Location: [facility];  Service: Urology;  Laterality: N/A;        Home Medications    Prior to Admission medications   Medication Sig Start Date End Date Taking? Authorizing Provider  acetaminophen (TYLENOL) 500 MG tablet Take 1,000 mg by mouth every 6 (six) hours as needed for mild pain.    [provider]  aspirin EC 81 MG tablet Take 81 mg by mouth daily.    [provider]  clopidogrel (PLAVIX) 75 MG tablet Take 75 mg by mouth daily.    [provider]  ferrous sulfate 325 (65 FE) MG tablet Take 325 mg by mouth daily.      [provider]  folic acid (FOLVITE) 371 MCG tablet Take 1,600 mcg by mouth daily.    [provider]  levofloxacin (LEVAQUIN) 500 MG tablet Take 1 tablet (500 mg total) by mouth daily. 03/24/15   [name], MD  lisinopril (PRINIVIL,ZESTRIL) 40 MG tablet Take 40 mg  by mouth daily.    [provider]  loperamide (IMODIUM) 2 MG capsule Take 1 capsule (2 mg total) by mouth as needed for diarrhea or loose stools. 03/24/15   [name], MD  metoprolol succinate (TOPROL-XL) 50 MG 24 hr tablet Take 50 mg by mouth daily. Take with or immediately following a meal.    [provider]  simvastatin (ZOCOR) 80 MG tablet Take 40 mg by mouth daily. For cholesterol    [provider]  zolpidem (AMBIEN) 5 MG tablet Take 1 tablet (5 mg total) by mouth at bedtime as needed for sleep. 03/24/15   [name], MD    Family History Family History  Problem Relation Age of Onset  . Heart attack Brother        Deceased   . Heart attack Brother        Deceased  . Anesthesia problems Neg Hx   . Hypotension Neg Hx   . Malignant hyperthermia Neg Hx   . Pseudochol deficiency Neg Hx     Social History Social History   Tobacco Use  . Smoking status: Current Every Day Smoker    Packs/day: 1.50    Years: 50.00    Pack years: 75.00    Types: Cigarettes  . Smokeless tobacco: Never Used  Substance Use Topics  . Alcohol use: No  . Drug use: No     Allergies   Codeine and Penicillins   Review of Systems Review of Systems  Constitutional: Negative for fever.  Gastrointestinal: Positive for abdominal pain, diarrhea, nausea and vomiting.  All other systems reviewed and are negative.    Physical Exam Updated Vital Signs BP (!) 155/94 (BP Location: Right Arm)   Pulse 93   Temp 99 F (37.2 C) (Oral)   Resp 20   Ht 1.753 m (5\' 9" )   Wt 63.5 kg (140 lb)   SpO2 100%   BMI 20.67 kg/m   Physical Exam CONSTITUTIONAL: Elderly, no acute distress HEAD: Normocephalic/atraumatic EYES: Blind ENMT: Mucous membranes moist NECK: supple no meningeal signs SPINE/BACK:entire spine nontender CV: S1/S2 noted, no murmurs/rubs/gallops noted LUNGS: Lungs are clear to auscultation bilaterally, no apparent distress ABDOMEN: soft, nontender, no  rebound or guarding, bowel sounds noted throughout abdomen NEURO: Pt is awake/alert/appropriate, moves all extremitiesx4.  EXTREMITIES: Equal femoral pulses noted SKIN: warm, color normal PSYCH: no abnormalities of mood noted, alert and oriented to situation   ED Treatments / Results  Labs (all labs ordered are listed, but only abnormal results are displayed) Labs Reviewed  COMPREHENSIVE METABOLIC PANEL - Abnormal; Notable for the following components:      Result Value   Calcium 8.8 (*)    Total Protein 6.2 (*)    Albumin 3.4 (*)    AST 96 (*)    GFR calc non Af Amer 58 (*)    All other components  within normal limits  CBC WITH DIFFERENTIAL/PLATELET - Abnormal; Notable for the following components:   WBC 13.1 (*)    RBC 2.85 (*)    Hemoglobin 10.1 (*)    HCT 29.5 (*)    MCV 103.5 (*)    MCH 35.4 (*)    RDW 16.1 (*)    All other components within normal limits  PROTIME-INR - Abnormal; Notable for the following components:   Prothrombin Time 15.7 (*)    All other components within normal limits  I-STAT CG4 LACTIC ACID, ED  I-STAT CG4 LACTIC ACID, ED  TYPE AND SCREEN    EKG EKG Interpretation  Date/Time:  Wednesday December 26 2017 05:23:39 EDT Ventricular Rate:  105 PR Interval:    QRS Duration: 92 QT Interval:  316 QTC Calculation: 418 R Axis:   77 Text Interpretation:  Sinus tachycardia Borderline repolarization abnormality Confirmed by Ripley Fraise [phone]) on 12/26/2017 5:37:25 AM   Radiology No results found.  Procedures Procedures   Medications Ordered in ED Medications - No data to display   Initial Impression / Assessment and Plan / ED Course  I have reviewed the triage vital signs and the nursing notes.  Pertinent labs results that were available during my care of the patient were reviewed by me and considered in my medical decision making (see chart for details).     5:51 AM Seen on arrival after transfer from [facility] for 9.7 cm AAA.   Patient is currently asymptomatic and requesting a cigarette.  I discussed the case with vascular Dr. Trula Slade.  He has seen patient.  At this time, patient is not an operative candidate, but he does recommend medical admission for serial exams and consider palliative consult   7:58 AM Patient updated on plan.  Discussed with Dr. Lorin Mercy for admission Final Clinical Impressions(s) / ED Diagnoses   Final diagnoses:  Abdominal aortic aneurysm (AAA) without rupture [facility])    ED Discharge Orders    None       Ripley Fraise, MD 12/26/17 [phone]

## 2017-12-26 NOTE — Progress Notes (Signed)
Pt arrived to 4e from [facility] ED. Vitals obtained. Telemetry box applied and CCMD notified x2. Pt oriented to room and staff. CHG completed.    Ara Kussmaul BSN, RN

## 2017-12-26 NOTE — ED Notes (Addendum)
Pt hits call bell every few minutes, due to "not wanting to be alone" this RN as well as EMT have been going to check on patient each time he rings his call bell for reassurance and to ensure he does not have any new needs. Mostly patient just wishes to speak to someone. He has called his son to come and sit with him who states he can "come later".

## 2017-12-26 NOTE — ED Triage Notes (Signed)
Pt [facility] for eval of known AAA which was 6.9 cm that has now increased in size to 9.7. Pt arrives A&Ox4, no acute distress w/ VSS. Pt went to outside hospital for eval of N/V/D x 3 days and the known AAA had been found to grow in size. Pt denies chest pain, endorses headache.

## 2017-12-26 NOTE — ED Notes (Addendum)
Pt helped onto bed side commode, when changing pts brief it was noted that he has black stool around his bottom, he also had small black BM which was tested on a occult blood card to be positive for blood. Pt did well standing and transferring from bedside commode back to bed.

## 2017-12-26 NOTE — ED Notes (Signed)
Pt calling out, given ice chips , I spoke to dr yates about pts  Having another black bm and his code status, she states she spoke to pt and son and she was putting in his admit orders now

## 2017-12-26 NOTE — Consult Note (Signed)
Vascular and Vein Specialist of [facility]  Patient name: [name] MRN: [id number] DOB: 1929-06-09 Sex: male   REQUESTING PROVIDER:    ER   REASON FOR CONSULT:    AAA  HISTORY OF PRESENT ILLNESS:   [name] is a 84 y.o. male, who  was transferred from [facility]. [place] this morning excisional with a CT scan showing an approximate 10 cm abdominal aortic aneurysm.  Reportedly the patient had diarrhea and pain as well as an elevated lactate.  Upon arrival, the patient was hemodynamically stable.  He did not endorse any pain.  His lactate was elevated at [facility].  The patient was seen in our office by Dr. Kellie Simmering in 2013.  At that time the patient was adamant that he did not want his aneurysm repaired.  It was also felt that he was not a candidate for open repair and that he was a marginal candidate for endovascular repair given his disease iliac arteries.  The patient suffers from bilateral blindness secondary to glaucoma.  He has a history of coronary artery disease, status post CABG in 1998.  He has undergone carotid endarterectomy in the past.  He suffers from COPD and continues to smoke.  PAST MEDICAL HISTORY    Past Medical History:  Diagnosis Date  . AAA (abdominal aortic aneurysm) without rupture ([place]) 12/26/2017  . Anxiety   . Aortic aneurysm ([place])   . Blindness - both eyes    Secondary to glaucoma  . BPH (benign prostatic hyperplasia)   . CAD (coronary artery disease)    Multivessel status post CABG 1998  . Carotid disease, bilateral ([place])   . COPD (chronic obstructive pulmonary disease) ([place])   . Depression   . Essential hypertension   . Femoral artery stenosis ([place])   . GERD (gastroesophageal reflux disease)   . Glaucoma   . Headache(784.0)   . History of stroke   . PAD (peripheral artery disease) ([place])   . SVT (supraventricular tachycardia) ([facility])      FAMILY HISTORY   Family History  Problem Relation Age of Onset  .  Heart attack Brother        Deceased   . Heart attack Brother        Deceased  . Anesthesia problems Neg Hx   . Hypotension Neg Hx   . Malignant hyperthermia Neg Hx   . Pseudochol deficiency Neg Hx     SOCIAL HISTORY:   Social History   Socioeconomic History  . Marital status: Widowed    Spouse name: Not on file  . Number of children: Not on file  . Years of education: Not on file  . Highest education level: Not on file  Occupational History  . Occupation: [employer]    Comment: Retired  Scientific laboratory technician  . Financial resource strain: Not on file  . Food insecurity:    Worry: Not on file    Inability: Not on file  . Transportation needs:    Medical: Not on file    Non-medical: Not on file  Tobacco Use  . Smoking status: Current Every Day Smoker    Packs/day: 1.50    Years: 55.00    Pack years: 82.50    Types: Cigarettes  . Smokeless tobacco: Never Used  Substance and Sexual Activity  . Alcohol use: No  . Drug use: No  . Sexual activity: Not on file  Lifestyle  . Physical activity:    Days per week: Not on file  Minutes per session: Not on file  . Stress: Not on file  Relationships  . Social connections:    Talks on phone: Not on file    Gets together: Not on file    Attends religious service: Not on file    Active member of club or organization: Not on file    Attends meetings of clubs or organizations: Not on file    Relationship status: Not on file  . Intimate partner violence:    Fear of current or ex partner: Not on file    Emotionally abused: Not on file    Physically abused: Not on file    Forced sexual activity: Not on file  Other Topics Concern  . Not on file  Social History Narrative  . Not on file    ALLERGIES:    Allergies  Allergen Reactions  . Ambien [Zolpidem Tartrate] Other (See Comments)    Severe nightmares  . Penicillins Hives    Has patient had a PCN reaction causing immediate rash, facial/tongue/throat swelling, SOB or  lightheadedness with hypotension: Unknown Has patient had a PCN reaction causing severe rash involving mucus membranes or skin necrosis: Unknown Has patient had a PCN reaction that required hospitalization: Unknown Has patient had a PCN reaction occurring within the last 10 years: Yes If all of the above answers are "NO", then may proceed with Cephalosporin use.   . Codeine Hives and Palpitations    CURRENT MEDICATIONS:    Current Facility-Administered Medications  Medication Dose Route Frequency Provider Last Rate Last Dose  . acetaminophen (TYLENOL) tablet 650 mg  650 mg Oral Q6H PRN Karmen Bongo, MD   650 mg at 12/26/17 1223   Or  . acetaminophen (TYLENOL) suppository 650 mg  650 mg Rectal Q6H PRN Karmen Bongo, MD      . alum & mag hydroxide-simeth (MAALOX/MYLANTA) 200-200-20 MG/5ML suspension 15 mL  15 mL Oral Q4H PRN Karmen Bongo, MD   15 mL at 12/26/17 1633  . atorvastatin (LIPITOR) tablet 40 mg  40 mg Oral q1800 Karmen Bongo, MD   40 mg at 12/26/17 1634  . ferrous sulfate tablet 325 mg  325 mg Oral Daily Karmen Bongo, MD   325 mg at 12/26/17 1220  . metoprolol succinate (TOPROL-XL) 24 hr tablet 50 mg  50 mg Oral Daily Karmen Bongo, MD   50 mg at 12/26/17 1220  . morphine 4 MG/ML injection 2 mg  2 mg Intravenous Q2H PRN Karmen Bongo, MD      . nicotine (NICODERM CQ - dosed in mg/24 hours) patch 14 mg  14 mg Transdermal Once Ripley Fraise, MD   14 mg at 12/26/17 0926  . ondansetron (ZOFRAN) tablet 4 mg  4 mg Oral Q6H PRN Karmen Bongo, MD       Or  . ondansetron [facility]) injection 4 mg  4 mg Intravenous Q6H PRN Karmen Bongo, MD      . sodium chloride flush (NS) 0.9 % injection 3 mL  3 mL Intravenous Q12H Karmen Bongo, MD   3 mL at 12/26/17 1220  . zolpidem (AMBIEN) tablet 5 mg  5 mg Oral QHS PRN Karmen Bongo, MD        REVIEW OF SYSTEMS:   [X]  denotes positive finding, [ ]  denotes negative finding Cardiac  Comments:  Chest pain or chest  pressure:    Shortness of breath upon exertion:    Short of breath when lying flat:    Irregular heart rhythm:  Vascular    Pain in calf, thigh, or hip brought on by ambulation:    Pain in feet at night that wakes you up from your sleep:     Blood clot in your veins:    Leg swelling:         Pulmonary    Oxygen at home:    Productive cough:     Wheezing:         Neurologic    Sudden weakness in arms or legs:     Sudden numbness in arms or legs:     Sudden onset of difficulty speaking or slurred speech:    Temporary loss of vision in one eye:     Problems with dizziness:         Gastrointestinal    Blood in stool:  x    Vomited blood:         Genitourinary    Burning when urinating:     Blood in urine:        Psychiatric    Major depression:         Hematologic    Bleeding problems:    Problems with blood clotting too easily:        Skin    Rashes or ulcers:        Constitutional    Fever or chills:     PHYSICAL EXAM:   Vitals:   12/26/17 1300 12/26/17 1330 12/26/17 1430 12/26/17 1514  BP: 135/68 (!) 161/89 133/68 131/85  Pulse: 87 96 76 85  Resp: (!) 22 20 18  (!) 24  Temp:    98.2 F (36.8 C)  TempSrc:    Oral  SpO2: 94% 98% 96% 100%  Weight:      Height:        GENERAL: The patient is a well-nourished male, in no acute distress. The vital signs are documented above. CARDIAC: There is a regular rate and rhythm.  VASCULAR: Nonpalpable pedal pulses PULMONARY: Nonlabored respirations ABDOMEN: Pulsatile abdominal mass is easily palpated.  It is completely nontender.  I have reviewed the CT scan MUSCULOSKELETAL: There are no major deformities or cyanosis. NEUROLOGIC: No focal weakness or paresthesias are detected. SKIN: There are no ulcers or rashes noted. PSYCHIATRIC: The patient has a normal affect.  STUDIES:   Which shows a 10 cm infrarenal abdominal aortic aneurysm.  No signs of rupture.  No signs of bowel ischemia.  ASSESSMENT and PLAN     AAA: I initially evaluated the patient this morning as soon as he arrived.  He has a large 10 cm infrarenal abdominal aortic aneurysm.  This is nontender.  It is not symptomatic.  There is no evidence of rupture.  Previously, the patient had not wish to have this fixed.  He is not a candidate for open repair.  I am skeptical as to whether or not he is a candidate for endovascular repair.  There are no plans for urgent repair.  I will need to better evaluate his CT scan, possibly repeating this as a CT angiogram before making definitive decision as to whether or not he is a candidate for endovascular repair.  This was discussed with the family.  Diarrhea: The patient feels that he has diarrhea secondary to laxative abuse.  He had significant stool burden on CT scan.  He is now heme positive.  He will need to have this further evaluated.  If this is an aortoenteric fistula, this will be a fatal process.  Annamarie Major, MD [facility] and Vein Specialists of [facility] [phone] Pager [phone]

## 2017-12-27 DIAGNOSIS — I10 Essential (primary) hypertension: Secondary | ICD-10-CM

## 2017-12-27 LAB — CBC
HCT: 29.1 % — ABNORMAL LOW (ref 39.0–52.0)
HEMATOCRIT: 28.3 % — AB (ref 39.0–52.0)
HEMOGLOBIN: 9.6 g/dL — AB (ref 13.0–17.0)
Hemoglobin: 9.5 g/dL — ABNORMAL LOW (ref 13.0–17.0)
MCH: 35.3 pg — AB (ref 26.0–34.0)
MCH: 33.8 pg (ref 26.0–34.0)
MCHC: 33.6 g/dL (ref 30.0–36.0)
MCHC: 33 g/dL (ref 30.0–36.0)
MCV: 105.2 fL — AB (ref 78.0–100.0)
MCV: 102.5 fL — ABNORMAL HIGH (ref 78.0–100.0)
Platelets: 100 10*3/uL — ABNORMAL LOW (ref 150–400)
Platelets: 91 10*3/uL — ABNORMAL LOW (ref 150–400)
RBC: 2.69 MIL/uL — ABNORMAL LOW (ref 4.22–5.81)
RBC: 2.84 MIL/uL — ABNORMAL LOW (ref 4.22–5.81)
RDW: 16.1 % — ABNORMAL HIGH (ref 11.5–15.5)
RDW: 14.5 % (ref 11.5–15.5)
WBC: 8.9 10*3/uL (ref 4.0–10.5)
WBC: 7.5 10*3/uL (ref 4.0–10.5)

## 2017-12-27 LAB — BASIC METABOLIC PANEL
Anion gap: 9 (ref 5–15)
BUN: 20 mg/dL (ref 6–20)
CHLORIDE: 108 mmol/L (ref 101–111)
CO2: 23 mmol/L (ref 22–32)
Calcium: 8.4 mg/dL — ABNORMAL LOW (ref 8.9–10.3)
Creatinine, Ser: 0.97 mg/dL (ref 0.61–1.24)
GFR calc Af Amer: >60 mL/min (ref >60)
GFR calc non Af Amer: >60 mL/min (ref >60)
GLUCOSE: 81 mg/dL (ref 65–99)
POTASSIUM: 4.3 mmol/L (ref 3.5–5.1)
SODIUM: 140 mmol/L (ref 135–145)

## 2017-12-27 MED ORDER — HYDRALAZINE HCL 20 MG/ML IJ SOLN: 10.0000 mg | Freq: Four times a day (QID) | INTRAMUSCULAR | Status: DC | PRN

## 2017-12-27 NOTE — Progress Notes (Signed)
[facility]                                                                              Patient Demographics  [name], is a 84 y.o. male, DOB - 11/09/1928, SHF:[id number]  Admit date - 12/26/2017   Admitting Physician Karmen Bongo, MD  Outpatient Primary MD for the patient is Jani Gravel, MD  Outpatient specialists:   LOS - 1  days   Medical records reviewed and are as summarized below:    Chief Complaint  Patient presents with  . AAA       Brief summary   [name] is a 84 y.o. male with medical history significant of SVT; PAD; CVA; HTN; depression; COPD; CAD; blindness; and AAA presenting with N/V/D.  He forgot what has been happening.  He denies abdominal pain and n/v at this time.  He denies any problems at this time. He is having dark stools concerning for blood.  At the time of my evaluation, he had no c/o pain and no TTP; however, when I spoke with his granddaughter several hours later she reported that he was having recurrent stools and abdominal pain. ED Course:  Seen at [facility] with n/v/d and elevated lactate, AAA 9.7 cm.  Arranged transfer to see Dr. Trula Slade.  Previously refused intervention.  Assessment & Plan    Principal problem   AAA (abdominal aortic aneurysm) without rupture ([place]), 10 cm -Patient presented with abdominal pain, heme positive tarry stools. -CT scan at [facility] showed approximately 10 cm abdominal aortic aneurysm. -Vascular surgery was consulted.  He was previously seen by Dr. Kellie Simmering in 2013 and patient had refused aneurysm repair at that time, was not felt a candidate for open repair.   -Further recommendations by vascular surgery, palliative medicine also has been consulted for goals of care  Diarrhea, possible GI bleed -Possibly due to laxative use, FOBT positive -Will await palliative medicine goals of care, high risk for invasive GI interventions -H&H currently stable, continue PPI    HTN (hypertension) -BP  currently stable, continue metoprolol, add hydralazine as needed  Hyper lipedema -Continue atorvastatin  Code Status: DNR DVT Prophylaxis:   SCD's Family Communication: Discussed in detail with the patient, all imaging results, lab results explained to the patient.  Disposition Plan: Awaiting palliative medicine evaluation  Time Spent in minutes 25 minutes  Procedures:  None  Consultants:   Vascular surgery Palliative medicine  Antimicrobials:      Medications  Scheduled Meds: . atorvastatin  40 mg Oral q1800  . ferrous sulfate  325 mg Oral Daily  . metoprolol succinate  50 mg Oral Daily  . pantoprazole  40 mg Intravenous Q12H  . sodium chloride flush  3 mL Intravenous Q12H   Continuous Infusions: . lactated ringers 75 mL/hr at 12/27/17 0651   PRN Meds:.acetaminophen **OR** acetaminophen, alum & mag hydroxide-simeth, loperamide, morphine injection, ondansetron **OR** ondansetron (ZOFRAN) IV, zolpidem   Antibiotics   Anti-infectives (From admission, onward)   None        Subjective:   [name] was seen and examined today.  Denies any specific complaints, no fevers or chills.  No chest pain or shortness of breath.  No acute events overnight.    Objective:   Vitals:   12/27/17 0358 12/27/17 0700 12/27/17 0900 12/27/17 1003  BP: (!) 126/55   (!) 147/59  Pulse: 73 69 91 92  Resp: 20 (!) 23 (!) 23 19  Temp: 98.6 F (37 C)     TempSrc: Oral     SpO2: 98% 96% 96% 97%  Weight:      Height:        Intake/Output Summary (Last 24 hours) at 12/27/2017 1140 Last data filed at 12/27/2017 0800 Gross per 24 hour  Intake 1278.48 ml  Output 250 ml  Net 1028.48 ml     Wt Readings from Last 3 Encounters:  12/26/17 63.5 kg (140 lb)  12/05/16 63.5 kg (140 lb)  10/25/15 61.2 kg (135 lb)     Exam  General: Alert and oriented, NAD  Eyes:   HEENT:  Atraumatic, normocephalic  Cardiovascular: S1 S2 auscultated, no rubs, murmurs or gallops. Regular rate  and rhythm.  Respiratory: Clear to auscultation bilaterally, no wheezing, rales or rhonchi  Gastrointestinal: Soft, pulsatile abdominal mass, NT  Ext: no pedal edema bilaterally  Neuro: no obvious FND  Musculoskeletal: No digital cyanosis, clubbing  Skin: No rashes  Psych: Normal affect and demeanor   Data Reviewed:  I have personally reviewed following labs and imaging studies  Micro Results No results found for this or any previous visit (from the past 240 hour(s)).  Radiology Reports Dg Chest Port 1 View  Result Date: 12/26/2017 CLINICAL DATA:  Headache and chest pain for 3 days. EXAM: PORTABLE CHEST 1 VIEW COMPARISON:  03/31/2015 FINDINGS: Postoperative changes in the mediastinum. Heart size and pulmonary vascularity are normal. Lungs appear clear and expanded. No blunting of costophrenic angles. No pneumothorax. Calcification of the aorta. Degenerative changes in the spine and shoulders. IMPRESSION: No evidence of active pulmonary disease.  Aortic atherosclerosis. Electronically Signed   By: Lucienne Capers M.D.   On: 12/26/2017 05:53    Lab Data:  CBC: Recent Labs  Lab 12/26/17 0515 12/26/17 1834 12/27/17 0626  WBC 13.1* 11.8* 8.9  NEUTROABS 11.3*  --   --   HGB 10.1* 10.3* 9.5*  HCT 29.5* 30.0* 28.3*  MCV 103.5* 100.3* 105.2*  PLT 116* 125* 151*   Basic Metabolic Panel: Recent Labs  Lab 12/26/17 0515 12/27/17 0626  NA 138 140  K 4.1 4.3  CL 107 108  CO2 24 23  GLUCOSE 95 81  BUN 20 20  CREATININE 1.10 0.97  CALCIUM 8.8* 8.4*   GFR: Estimated Creatinine Clearance: 48.2 mL/min (by C-G formula based on SCr of 0.97 mg/dL). Liver Function Tests: Recent Labs  Lab 12/26/17 0515  AST 96*  ALT 44  ALKPHOS 96  BILITOT 0.8  PROT 6.2*  ALBUMIN 3.4*   No results for input(s): LIPASE, AMYLASE in the last 168 hours. No results for input(s): AMMONIA in the last 168 hours. Coagulation Profile: Recent Labs  Lab 12/26/17 0645  INR 1.26   Cardiac  Enzymes: No results for input(s): CKTOTAL, CKMB, CKMBINDEX, TROPONINI in the last 168 hours. BNP (last 3 results) No results for input(s): PROBNP in the last 8760 hours. HbA1C: No results for input(s): HGBA1C in the last 72 hours. CBG: No results for input(s): GLUCAP in the last 168 hours. Lipid Profile: No results for input(s): CHOL, HDL, LDLCALC, TRIG, CHOLHDL, LDLDIRECT in the last 72 hours. Thyroid Function Tests: No results for input(s): TSH, T4TOTAL, FREET4,  T3FREE, THYROIDAB in the last 72 hours. Anemia Panel: No results for input(s): VITAMINB12, FOLATE, FERRITIN, TIBC, IRON, RETICCTPCT in the last 72 hours. Urine analysis:    Component Value Date/Time   COLORURINE YELLOW 07/30/2011 0408   APPEARANCEUR CLEAR 07/30/2011 0408   LABSPEC 1.010 07/30/2011 0408   PHURINE 7.5 07/30/2011 0408   GLUCOSEU NEGATIVE 07/30/2011 0408   HGBUR LARGE (A) 07/30/2011 0408   BILIRUBINUR NEGATIVE 07/30/2011 0408   KETONESUR NEGATIVE 07/30/2011 0408   PROTEINUR NEGATIVE 07/30/2011 0408   UROBILINOGEN 0.2 07/30/2011 0408   NITRITE NEGATIVE 07/30/2011 0408   LEUKOCYTESUR NEGATIVE 07/30/2011 0408     Ripudeep Rai M.D. [facility] 12/27/2017, 11:40 AM  Pager: [phone] Between 7am to 7pm - call Pager - [phone]  After 7pm go to [email or web address] - password [email or web address]  Call night coverage person covering after 7pm

## 2017-12-27 NOTE — Progress Notes (Signed)
PMT consult received and chart reviewed. Palliative provider to meet with patient and son, Tarri Fuller tomorrow morning 12/28/17 at [place]. Thank you.   NO CHARGE  Ihor Dow, FNP-C Palliative Medicine Team  Phone: [phone] Fax: [phone]

## 2017-12-27 NOTE — Progress Notes (Signed)
    Subjective  -   Denies abdominal pain Concerned about diarrhea   Physical Exam:  abd soft Extremities well perfused, non-palpable pedal pulses Non labored breathing       Assessment/Plan:    Extensive discussion with family at bedside Patient not a candidate for open surgical repair of AAA Very poor candidate for endovascular repair due to short infra-renal neck and very challenging access due to calcified bilateral iliac arteries.  Patient currently with heme positive diarrhea.  GI consult pending.  Doubt this is an aorto-enteric fistula.  If it was, he would not be a candidate for repair.  Will await palliative care consult   [name] 12/27/2017 10:30 PM --  Vitals:   12/27/17 1646 12/27/17 1700  BP: 136/63   Pulse: 80 84  Resp: 18 19  Temp: 98.2 F (36.8 C)   SpO2: 99% 97%    Intake/Output Summary (Last 24 hours) at 12/27/2017 2230 Last data filed at 12/27/2017 1600 Gross per 24 hour  Intake 1118.02 ml  Output -  Net 1118.02 ml     Laboratory CBC    Component Value Date/Time   WBC 7.5 12/27/2017 1839   HGB 9.6 (L) 12/27/2017 1839   HCT 29.1 (L) 12/27/2017 1839   PLT 91 (L) 12/27/2017 1839    BMET    Component Value Date/Time   NA 140 12/27/2017 0626   K 4.3 12/27/2017 0626   CL 108 12/27/2017 0626   CO2 23 12/27/2017 0626   GLUCOSE 81 12/27/2017 0626   BUN 20 12/27/2017 0626   CREATININE 0.97 12/27/2017 0626   CALCIUM 8.4 (L) 12/27/2017 0626   GFRNONAA >60 12/27/2017 0626   GFRAA >60 12/27/2017 0626    COAG Lab Results  Component Value Date   INR 1.26 12/26/2017   INR 1.24 03/15/2015   INR 1.25 01/27/2010   No results found for: PTT  Antibiotics Anti-infectives (From admission, onward)   None       V. Leia Alf, M.D. [facility] [place] Office: [phone] Pager:  [phone]

## 2017-12-28 DIAGNOSIS — Z515 Encounter for palliative care: Secondary | ICD-10-CM

## 2017-12-28 DIAGNOSIS — Z7189 Other specified counseling: Secondary | ICD-10-CM

## 2017-12-28 LAB — CBC
HCT: 29.7 % — ABNORMAL LOW (ref 39.0–52.0)
HEMOGLOBIN: 9.6 g/dL — AB (ref 13.0–17.0)
MCH: 32.3 pg (ref 26.0–34.0)
MCHC: 32.3 g/dL (ref 30.0–36.0)
MCV: 100 fL (ref 78.0–100.0)
Platelets: 102 10*3/uL — ABNORMAL LOW (ref 150–400)
RBC: 2.97 MIL/uL — AB (ref 4.22–5.81)
RDW: 14 % (ref 11.5–15.5)
WBC: 6.8 10*3/uL (ref 4.0–10.5)

## 2017-12-28 MED ORDER — NICOTINE 21 MG/24HR TD PT24
21.0000 mg | MEDICATED_PATCH | Freq: Every day | TRANSDERMAL | Status: DC
  Administered 2017-12-28 – 2017-12-31 (×4): 21 mg via TRANSDERMAL
  Filled 2017-12-28 (×4): qty 1

## 2017-12-28 NOTE — Plan of Care (Signed)
  Problem: Education: Goal: Knowledge of General Education information will improve Outcome: Progressing   Problem: Clinical Measurements: Goal: Will remain free from infection Outcome: Progressing Goal: Respiratory complications will improve Outcome: Progressing   Problem: Health Behavior/Discharge Planning: Goal: Ability to manage health-related needs will improve Outcome: Not Progressing   Problem: Activity: Goal: Risk for activity intolerance will decrease Outcome: Not Progressing

## 2017-12-28 NOTE — Progress Notes (Signed)
Patient ID: [name], male   DOB: [birth year], 84 y.o.   MRN: [id number]                                                                PROGRESS NOTE                                                                                                                                                                                                             Patient Demographics:    [name], is a 84 y.o. male, DOB - Jul 03, 1929, IOE:[id number]  Admit date - 12/26/2017   Admitting Physician Karmen Bongo, MD  Outpatient Primary MD for the patient is Jani Gravel, MD  LOS - 2  Outpatient Specialists:     Chief Complaint  Patient presents with  . AAA       Brief Narrative    84 y.o.malewith medical history significant ofSVT; PAD; CVA; HTN; depression; COPD; CAD;blindness;and AAA presenting with N/V/D.He forgot what has been happening. He denies abdominal pain and n/v at this time. He denies any problems at this time. He is having dark stools concerning for blood. At the time of my evaluation, he had no c/o pain and no TTP; however, when I spoke with his granddaughter several hours later she reported that he was having recurrent stools and abdominal pain. ED Course:Seen at [facility] with n/v/d and elevated lactate, AAA 9.7 cm. Arranged transfer to see Dr. Trula Slade. Previously refused intervention.     Subjective:    [name] today states no fever, abd pain, diarrhea, brbpr.  Pt overnite had slight black stool, with stool ball.    No headache, No chest pain,  - No Nausea, No new weakness tingling or numbness, No Cough - SOB.    Assessment  & Plan :    Active Problems:   AAA (abdominal aortic aneurysm) without rupture (HCC)   HTN (hypertension)   GI bleeding   AAA (abdominal aortic aneurysm) without rupture ([place]), 10 cm -Patient presented with abdominal pain, heme positive tarry stools.  -CT scan at [facility] showed approximately 10 cm abdominal aortic aneurysm. -Vascular  surgery was consulted.  He was previously seen by Dr. Kellie Simmering in 2013 and patient had refused aneurysm repair at that time, was not felt a candidate for open repair.   -  per vascular not a candidate for intervention - palliative medicine also has been consulted for goals of care  Diarrhea, possible GI bleed -Possibly due to laxative use, FOBT positive -await palliative medicine goals of care, high risk for invasive GI interventions - continue PPI - check cbc in am  HTN (hypertension) -BP currently stable, continue metoprolol, add hydralazine as needed -check cmp in am  Hyperlipedema -Continue atorvastatin      Code Status : DNR  Family Communication  :  w patient  Disposition Plan  :   ? hospice  Barriers For Discharge :   Consults  :   Vascular surgery, Palliative care  Procedures  : none  DVT Prophylaxis  :   SCDs  Lab Results  Component Value Date   PLT 91 (L) 12/27/2017    Antibiotics  :  none  Anti-infectives (From admission, onward)   None        Objective:   Vitals:   12/27/17 1500 12/27/17 1646 12/27/17 1700 12/28/17 0415  BP:  136/63  (!) 155/64  Pulse: 84 80 84 73  Resp: (!) 21 18 19  (!) 21  Temp:  98.2 F (36.8 C)  98 F (36.7 C)  TempSrc:  Oral  Oral  SpO2: 96% 99% 97% 95%  Weight:    64.1 kg (141 lb 5 oz)  Height:        Wt Readings from Last 3 Encounters:  12/28/17 64.1 kg (141 lb 5 oz)  12/05/16 63.5 kg (140 lb)  10/25/15 61.2 kg (135 lb)     Intake/Output Summary (Last 24 hours) at 12/28/2017 0631 Last data filed at 12/28/2017 0419 Gross per 24 hour  Intake 1465.95 ml  Output 1002 ml  Net 463.95 ml     Physical Exam  Awake Alert, Oriented X 3, No new F.N deficits, Normal affect [place].AT,PERRAL Supple Neck,No JVD, No cervical lymphadenopathy appriciated.  Symmetrical Chest wall movement, Good air movement bilaterally, CTAB RRR,No Gallops,Rubs or new Murmurs, No Parasternal Heave +ve B.Sounds, Abd Soft, No tenderness, No  organomegaly appriciated, No rebound - guarding or rigidity. No Cyanosis, Clubbing or edema, No new Rash or bruise      Data Review:    CBC Recent Labs  Lab 12/26/17 0515 12/26/17 1834 12/27/17 0626 12/27/17 1839  WBC 13.1* 11.8* 8.9 7.5  HGB 10.1* 10.3* 9.5* 9.6*  HCT 29.5* 30.0* 28.3* 29.1*  PLT 116* 125* 100* 91*  MCV 103.5* 100.3* 105.2* 102.5*  MCH 35.4* 34.4* 35.3* 33.8  MCHC 34.2 34.3 33.6 33.0  RDW 16.1* 14.9 16.1* 14.5  LYMPHSABS 0.8  --   --   --   MONOABS 0.7  --   --   --   EOSABS 0.3  --   --   --   BASOSABS 0.0  --   --   --     Chemistries  Recent Labs  Lab 12/26/17 0515 12/27/17 0626  NA 138 140  K 4.1 4.3  CL 107 108  CO2 24 23  GLUCOSE 95 81  BUN 20 20  CREATININE 1.10 0.97  CALCIUM 8.8* 8.4*  AST 96*  --   ALT 44  --   ALKPHOS 96  --   BILITOT 0.8  --    ------------------------------------------------------------------------------------------------------------------ No results for input(s): CHOL, HDL, LDLCALC, TRIG, CHOLHDL, LDLDIRECT in the last 72 hours.  No results found for: HGBA1C ------------------------------------------------------------------------------------------------------------------ No results for input(s): TSH, T4TOTAL, T3FREE, THYROIDAB in the last 72 hours.  Invalid input(s): FREET3 ------------------------------------------------------------------------------------------------------------------  No results for input(s): VITAMINB12, FOLATE, FERRITIN, TIBC, IRON, RETICCTPCT in the last 72 hours.  Coagulation profile Recent Labs  Lab 12/26/17 0645  INR 1.26    No results for input(s): DDIMER in the last 72 hours.  Cardiac Enzymes No results for input(s): CKMB, TROPONINI, MYOGLOBIN in the last 168 hours.  Invalid input(s): CK ------------------------------------------------------------------------------------------------------------------ No results found for: BNP  Inpatient Medications  Scheduled Meds: .  atorvastatin  40 mg Oral q1800  . ferrous sulfate  325 mg Oral Daily  . metoprolol succinate  50 mg Oral Daily  . pantoprazole  40 mg Intravenous Q12H  . sodium chloride flush  3 mL Intravenous Q12H   Continuous Infusions: . lactated ringers 75 mL/hr at 12/27/17 1816   PRN Meds:.acetaminophen **OR** acetaminophen, alum & mag hydroxide-simeth, hydrALAZINE, loperamide, morphine injection, ondansetron **OR** ondansetron (ZOFRAN) IV, zolpidem  Micro Results No results found for this or any previous visit (from the past 240 hour(s)).  Radiology Reports Dg Chest Port 1 View  Result Date: 12/26/2017 CLINICAL DATA:  Headache and chest pain for 3 days. EXAM: PORTABLE CHEST 1 VIEW COMPARISON:  03/31/2015 FINDINGS: Postoperative changes in the mediastinum. Heart size and pulmonary vascularity are normal. Lungs appear clear and expanded. No blunting of costophrenic angles. No pneumothorax. Calcification of the aorta. Degenerative changes in the spine and shoulders. IMPRESSION: No evidence of active pulmonary disease.  Aortic atherosclerosis. Electronically Signed   By: Lucienne Capers M.D.   On: 12/26/2017 05:53    Time Spent in minutes  30   Jani Gravel M.D on 12/28/2017 at 6:31 AM  Between 7am to 7pm - Pager - [phone]    After 7pm go to [email or web address] - password [email or web address]  [facility] -  Office  [phone]

## 2017-12-28 NOTE — Consult Note (Signed)
Consultation Note Date: 12/28/2017   Patient Name: [name]  DOB: 12/10/1928  MRN: [id number]  Age / Sex: 84 y.o., male  PCP: Jani Gravel, MD Referring Physician: Jani Gravel, MD  Reason for Consultation: Establishing goals of care and Terminal Care  HPI/Patient Profile: 84 y.o. male  with past medical history of SVT, PAD, CVA, HTN, COPD, CAD, depression, blindneses, and AAA admitted on 12/26/2017 with nausea, vomiting and diarrhea. Patient has been having dark stools concerning for blood. At [facility], patient found to have elevated lactate and AAA of 9.7cm. Transfer to [facility] to see vascular surgery. Patient seen by Dr. Kellie Simmering in 2013 for AAA. At that time, patient refused aneurysm repair and was not felt to be a good candidate for open repair. Vascular surgery has evaluated this admission and determined he is not a candidate for open surgical repair or endovascular repair. Also FOBT positive with pending GI consult. High risk for GI interventions. Palliative medicine consultation for goals of care.   Clinical Assessment and Goals of Care: I have reviewed medical records, discussed with care team, and met with patient, son Tarri Fuller), daughter Lelon Frohlich), and sister Enid Derry) to discuss diagnosis, prognosis, [place], EOL wishes, disposition and options.  Patient wakes to voice. He is drowsy. Denies pain or discomfort. Periods of pleasant confusion and does not participate in [place] conversation.   Introduced Palliative Medicine as specialized medical care for people living with serious illness. It focuses on providing relief from the symptoms and stress of a serious illness. The goal is to improve quality of life for both the patient and the family.  We discussed a brief life review of the patient. Family shares his love of fishing and outdoor activities before he became blind. Widowed. Ann and Tarri Fuller are his only children.  Prior to hospitalization, living with [place]. Mr. Fortson is blind and does require assist with ADL's. Ambulates with cane or walker.   Lelon Frohlich and [place] recall history of AAA and consult with Dr. Kellie Simmering in 2013, for which he declined surgery.   Discussed hospital diagnoses and interventions. Explained guarded prognosis with high risk for acute decompensation secondary to AAA. They understand he is not a candidate for surgical interventions. We also discussed positive occult stool and possible GI bleed, but again being a poor candidate for invasive GI interventions. Family does have a good understanding of this and speak of not wanting to "put him through that" or cause "suffering."   Advanced directives, concepts specific to code status, artifical feeding and hydration, and rehospitalization were considered and discussed. Children tell me he does not have a documented living will or POA paperwork. Ann and [employer] speak of the importance of making decisions together as a family. They confirm DNR code status.   The difference between aggressive medical intervention and comfort care was considered in light of the patient's goals of care. Explained importance of implementing a plan (whether home or in a facility) if and when AAA ruptures, including role of symptom management medications to relieve pain and  suffering.   Palliative and hospice services outpatient were explained and offered. Educated on hospice philosophy and options. Explained goal of comfort, quality, and dignity at EOL. Also utilization of symptom management medications for comfort and preventing recurrent hospitalization. Enid Derry speaks highly of hospice services with her sister who recently passed away. Since Tarri Fuller is primary caregiver, Lelon Frohlich wishes for him to make this final decision--whether they pursue hospice facility OR home with hospice. Clinton speaks of not being able to take his father home in this current state.   Clinton seems  overwhelmed with making a decision today. Encouraged family to further discuss hospice options today and contact me with questions or concerns.   Therapeutic listening and emotional support provided. PMT contact information given.     SUMMARY OF RECOMMENDATIONS    Initial palliative discussion with son, daughter, and sister. Patient drowsy and does not participate in conversation.   DNR. Continue current supportive care.   Family understands he is NOT a candidate for surgical/endovascular repair. Discussed guarded prognosis with high risk for decompensation from AAA rupture. Family confirms understanding of this. They do not want him to suffer.  ? GI consult per attending. I did discuss with family that he is a poor candidate for invasive GI workup.   Introduced hospice philosophy and options. Family is not ready to make a decision today but seems to be leaning towards pursuing comfort and hospice approach to care. Updated attending.  Code Status/Advance Care Planning:  DNR  Symptom Management:   Per attending  Palliative Prophylaxis:   Aspiration, Delirium Protocol, Frequent Pain Assessment, Oral Care and Turn Reposition  Psycho-social/Spiritual:   Desire for further Chaplaincy support: yes  Additional Recommendations: Caregiving  Support/Resources and Education on Hospice  Prognosis:   Unable to determine: guarded to poor with 9.7cm AAA not a surgical candidate. High risk for acute decompensation/rupture leading to death.   Discharge Planning: To Be Determined      Primary Diagnoses: Present on Admission: . (Resolved) AAA (abdominal aortic aneurysm) ([place]) . AAA (abdominal aortic aneurysm) without rupture ([place]) . HTN (hypertension) . GI bleeding   I have reviewed the medical record, interviewed the patient and family, and examined the patient. The following aspects are pertinent.  Past Medical History:  Diagnosis Date  . AAA (abdominal aortic aneurysm) without  rupture ([place]) 12/26/2017  . Anxiety   . Aortic aneurysm ([place])   . Blindness - both eyes    Secondary to glaucoma  . BPH (benign prostatic hyperplasia)   . CAD (coronary artery disease)    Multivessel status post CABG 1998  . Carotid disease, bilateral ([place])   . COPD (chronic obstructive pulmonary disease) ([place])   . Depression   . Essential hypertension   . Femoral artery stenosis ([place])   . GERD (gastroesophageal reflux disease)   . Glaucoma   . Headache(784.0)   . History of stroke   . PAD (peripheral artery disease) ([place])   . SVT (supraventricular tachycardia) (HCC)    Social History   Socioeconomic History  . Marital status: Widowed    Spouse name: Not on file  . Number of children: Not on file  . Years of education: Not on file  . Highest education level: Not on file  Occupational History  . Occupation: [employer]    Comment: Retired  Scientific laboratory technician  . Financial resource strain: Not on file  . Food insecurity:    Worry: Not on file    Inability: Not on file  .  Transportation needs:    Medical: Not on file    Non-medical: Not on file  Tobacco Use  . Smoking status: Current Every Day Smoker    Packs/day: 1.50    Years: 55.00    Pack years: 82.50    Types: Cigarettes  . Smokeless tobacco: Never Used  Substance and Sexual Activity  . Alcohol use: No  . Drug use: No  . Sexual activity: Not on file  Lifestyle  . Physical activity:    Days per week: Not on file    Minutes per session: Not on file  . Stress: Not on file  Relationships  . Social connections:    Talks on phone: Not on file    Gets together: Not on file    Attends religious service: Not on file    Active member of club or organization: Not on file    Attends meetings of clubs or organizations: Not on file    Relationship status: Not on file  Other Topics Concern  . Not on file  Social History Narrative  . Not on file   Family History  Problem Relation Age of Onset  . Heart attack Brother         Deceased   . Heart attack Brother        Deceased  . Anesthesia problems Neg Hx   . Hypotension Neg Hx   . Malignant hyperthermia Neg Hx   . Pseudochol deficiency Neg Hx    Scheduled Meds: . atorvastatin  40 mg Oral q1800  . ferrous sulfate  325 mg Oral Daily  . metoprolol succinate  50 mg Oral Daily  . nicotine  21 mg Transdermal Daily  . pantoprazole  40 mg Intravenous Q12H  . sodium chloride flush  3 mL Intravenous Q12H   Continuous Infusions: . lactated ringers 75 mL/hr at 12/28/17 0734   PRN Meds:.acetaminophen **OR** acetaminophen, alum & mag hydroxide-simeth, hydrALAZINE, loperamide, morphine injection, ondansetron **OR** ondansetron (ZOFRAN) IV, zolpidem Medications Prior to Admission:  Prior to Admission medications   Medication Sig Start Date End Date Taking? Authorizing Provider  acetaminophen (TYLENOL) 500 MG tablet Take 1,000 mg by mouth every 6 (six) hours as needed for mild pain.   Yes [provider]  amLODipine (NORVASC) 5 MG tablet Take 7.5 mg by mouth daily.    Yes [provider]  Ascorbic Acid (VITAMIN C) 1000 MG tablet Take 1,000 mg by mouth daily.   Yes [provider]  aspirin EC 81 MG tablet Take 81 mg by mouth daily.   Yes [provider]  diphenhydrAMINE (BENADRYL) 25 mg capsule Take 25 mg by mouth at bedtime.   Yes [provider]  ferrous sulfate 325 (65 FE) MG tablet Take 325 mg by mouth daily.     Yes [provider]  folic acid (FOLVITE) 917 MCG tablet Take 400 mcg by mouth daily.    Yes [provider]  lisinopril (PRINIVIL,ZESTRIL) 40 MG tablet Take 40 mg by mouth daily.   Yes [provider]  loperamide (IMODIUM) 2 MG capsule Take 1 capsule (2 mg total) by mouth as needed for diarrhea or loose stools. 03/24/15  Yes McInnis, Luster Landsberg, MD  metoprolol succinate (TOPROL-XL) 50 MG 24 hr tablet Take 50-100 mg by mouth See admin instructions. 122m in the morning and 551min the  evening.Take with or immediately following a meal.   Yes [provider]  Multiple Vitamin (MULTIVITAMIN WITH MINERALS) TABS tablet Take 1 tablet by  mouth daily.   Yes [provider]  simvastatin (ZOCOR) 40 MG tablet Take 40 mg by mouth daily. For cholesterol   Yes [provider]   Allergies  Allergen Reactions  . Ambien [Zolpidem Tartrate] Other (See Comments)    Severe nightmares  . Penicillins Hives    Has patient had a PCN reaction causing immediate rash, facial/tongue/throat swelling, SOB or lightheadedness with hypotension: Unknown Has patient had a PCN reaction causing severe rash involving mucus membranes or skin necrosis: Unknown Has patient had a PCN reaction that required hospitalization: Unknown Has patient had a PCN reaction occurring within the last 10 years: Yes If all of the above answers are "NO", then may proceed with Cephalosporin use.   . Codeine Hives and Palpitations   Review of Systems  Unable to perform ROS  Physical Exam  Constitutional: He is easily aroused. He appears ill.  Denies pain  HENT:  Head: Normocephalic and atraumatic.  Pulmonary/Chest: No accessory muscle usage. No tachypnea. No respiratory distress.  Abdominal: There is no tenderness.  Neurological: He is easily aroused.  Drowsy  Skin: Skin is warm and dry. There is pallor.  Psychiatric: His speech is delayed. He is inattentive.  Nursing note and vitals reviewed.  Vital Signs: BP (!) 149/83 (BP Location: Left Arm)   Pulse 84   Temp 97.7 F (36.5 C) (Oral)   Resp (!) 25   Ht 5' 9"  (1.753 m)   Wt 64.1 kg (141 lb 5 oz)   SpO2 99%   BMI 20.87 kg/m  Pain Scale: 0-10 POSS *See Group Information*: S-Acceptable,Sleep, easy to arouse Pain Score: Asleep   SpO2: SpO2: 99 % O2 Device:SpO2: 99 % O2 Flow Rate: .   IO: Intake/output summary:   Intake/Output Summary (Last 24 hours) at 12/28/2017 1552 Last data filed at 12/28/2017 0419 Gross per 24 hour  Intake  851.6 ml  Output 1002 ml  Net -150.4 ml    LBM: Last BM Date: 12/28/17 Baseline Weight: Weight: 63.5 kg (140 lb) Most recent weight: Weight: 64.1 kg (141 lb 5 oz)     Palliative Assessment/Data: PPS 40%   Flowsheet Rows     Most Recent Value  Intake Tab  Referral Department  Hospitalist  Unit at Time of Referral  Med/Surg Unit  Palliative Care Primary Diagnosis  -- [AAA]  Palliative Care Type  New Palliative care  Reason for referral  Clarify Goals of Care  Date first seen by Palliative Care  12/28/17  Clinical Assessment  Palliative Performance Scale Score  40%  Psychosocial & Spiritual Assessment  Palliative Care Outcomes  Patient/Family meeting held?  Yes  Who was at the meeting?  son, daughter, sister  Palliative Care Outcomes  Clarified goals of care, Counseled regarding hospice, Provided end of life care assistance, ACP counseling assistance, Provided psychosocial or spiritual support      Time In: 0910 Time Out: 1020 Time Total: 38mn Greater than 50%  of this time was spent counseling and coordinating care related to the above assessment and plan.  Signed by:  MIhor Dow FNP-C Palliative Medicine Team  Phone: [phone]: [phone]  Please contact Palliative Medicine Team phone at [phone] questions and concerns.  For individual provider: See AShea Evans

## 2017-12-28 NOTE — Progress Notes (Signed)
I have reviewed his CT scan with multiple people and collectively we agree that he is not a candidate for repair for his AAA, either from a open or percutaneous approach.   Annamarie Major

## 2017-12-28 NOTE — Plan of Care (Signed)
  Problem: Education: Goal: Knowledge of General Education information will improve Outcome: Progressing   Problem: Health Behavior/Discharge Planning: Goal: Ability to manage health-related needs will improve Outcome: Not Progressing   Problem: Clinical Measurements: Goal: Respiratory complications will improve Outcome: Not Progressing Goal: Cardiovascular complication will be avoided Outcome: Not Progressing   Problem: Nutrition: Goal: Adequate nutrition will be maintained Outcome: Not Progressing   Problem: Elimination: Goal: Will not experience complications related to bowel motility Outcome: Not Progressing Goal: Will not experience complications related to urinary retention Outcome: Not Progressing

## 2017-12-29 DIAGNOSIS — R197 Diarrhea, unspecified: Secondary | ICD-10-CM | POA: Diagnosis present

## 2017-12-29 LAB — COMPREHENSIVE METABOLIC PANEL
ALBUMIN: 2.6 g/dL — AB (ref 3.5–5.0)
ALK PHOS: 155 U/L — AB (ref 38–126)
ALT: 56 U/L (ref 17–63)
AST: 78 U/L — ABNORMAL HIGH (ref 15–41)
Anion gap: 8 (ref 5–15)
BILIRUBIN TOTAL: 0.7 mg/dL (ref 0.3–1.2)
BUN: 9 mg/dL (ref 6–20)
CALCIUM: 8.2 mg/dL — AB (ref 8.9–10.3)
CO2: 25 mmol/L (ref 22–32)
Chloride: 106 mmol/L (ref 101–111)
Creatinine, Ser: 0.81 mg/dL (ref 0.61–1.24)
GFR calc non Af Amer: >60 mL/min (ref >60)
Glucose, Bld: 110 mg/dL — ABNORMAL HIGH (ref 65–99)
POTASSIUM: 4 mmol/L (ref 3.5–5.1)
Sodium: 139 mmol/L (ref 135–145)
TOTAL PROTEIN: 5.1 g/dL — AB (ref 6.5–8.1)

## 2017-12-29 LAB — CBC
HEMATOCRIT: 28.6 % — AB (ref 39.0–52.0)
HEMOGLOBIN: 9.5 g/dL — AB (ref 13.0–17.0)
MCH: 33.2 pg (ref 26.0–34.0)
MCHC: 33.2 g/dL (ref 30.0–36.0)
MCV: 100 fL (ref 78.0–100.0)
Platelets: 105 10*3/uL — ABNORMAL LOW (ref 150–400)
RBC: 2.86 MIL/uL — ABNORMAL LOW (ref 4.22–5.81)
RDW: 13.7 % (ref 11.5–15.5)
WBC: 6.1 10*3/uL (ref 4.0–10.5)

## 2017-12-29 MED ORDER — PANTOPRAZOLE SODIUM 40 MG PO TBEC
40.0000 mg | DELAYED_RELEASE_TABLET | Freq: Two times a day (BID) | ORAL | Status: DC
  Administered 2017-12-29 – 2017-12-31 (×5): 40 mg via ORAL
  Filled 2017-12-29 (×5): qty 1

## 2017-12-29 MED ORDER — IBUPROFEN 400 MG PO TABS
400.0000 mg | ORAL_TABLET | Freq: Once | ORAL | Status: AC
  Administered 2017-12-29: 400 mg via ORAL
  Filled 2017-12-29: qty 1

## 2017-12-29 MED ORDER — AMLODIPINE BESYLATE 5 MG PO TABS
2.5000 mg | ORAL_TABLET | Freq: Every day | ORAL | Status: DC
  Administered 2017-12-29: 2.5 mg via ORAL
  Filled 2017-12-29 (×2): qty 1

## 2017-12-29 MED ORDER — PRO-STAT SUGAR FREE PO LIQD
30.0000 mL | Freq: Two times a day (BID) | ORAL | Status: DC
  Administered 2017-12-29 – 2017-12-31 (×4): 30 mL via ORAL
  Filled 2017-12-29 (×4): qty 30

## 2017-12-29 MED ORDER — LORAZEPAM 2 MG/ML IJ SOLN
0.5000 mg | Freq: Three times a day (TID) | INTRAMUSCULAR | Status: DC | PRN
  Administered 2017-12-31: 0.5 mg via INTRAVENOUS
  Filled 2017-12-29: qty 1

## 2017-12-29 MED ORDER — TRAMADOL HCL 50 MG PO TABS
50.0000 mg | ORAL_TABLET | Freq: Four times a day (QID) | ORAL | Status: DC | PRN
  Administered 2017-12-29 – 2017-12-31 (×3): 50 mg via ORAL
  Filled 2017-12-29 (×3): qty 1

## 2017-12-29 NOTE — Progress Notes (Signed)
Patient c/o constant headaches. Patient can not tell this RN where it hurts. Patient has been given tylenol that has not been effective and one time dose of ibuprofen without relief

## 2017-12-29 NOTE — Progress Notes (Signed)
Patient became agitated, c/o pain in his head, back, eyes and soul. Patient states that everything hurts. Patient trying to get out of bed stating that, "he was put in the room by himself, no way to see anything, couldn't find his clothes". Stated that, " he was leaving and wanted his clothes". Patient was argumentative with the staff, stating that he hasn't gotten anything for pain and he has been waiting an hour. Patient was reassured by this RN that he received pain meds earlier.  This RN gave patient pain meds, tylenol for his headache and zofran for his nausea. Patient is currently resting. Will continue to monitor

## 2017-12-29 NOTE — Progress Notes (Addendum)
Patient ID: [name], male   DOB: April 09, 1929, 84 y.o.   MRN: [id number]                                                                PROGRESS NOTE                                                                                                                                                                                                             Patient Demographics:    [name], is a 84 y.o. male, DOB - [birth year], YNW:[id number]  Admit date - 12/26/2017   Admitting Physician Karmen Bongo, MD  Outpatient Primary MD for the patient is Jani Gravel, MD  LOS - 3  Outpatient Specialists:     Chief Complaint  Patient presents with  . AAA       Brief Narrative    84 y.o.malewith medical history significant ofSVT; PAD; CVA; HTN; depression; COPD; CAD;blindness;and AAA presenting with N/V/D.He forgot what has been happening. He denies abdominal pain and n/v at this time. He denies any problems at this time. He is having dark stools concerning for blood. At the time of my evaluation, he had no c/o pain and no TTP; however, when I spoke with his granddaughter several hours later she reported that he was having recurrent stools and abdominal pain. ED Course:Seen at [facility] with n/v/d and elevated lactate, AAA 9.7 cm. Arranged transfer to see Dr. Trula Slade. Previously refused intervention.     Subjective:    [name] today had some headache overnite, currently gone.  Pt agitated last nite. Currently calm. Pt tolerating dysphagia 3 diet. No further diarrhea per RN.   Pt denies abd pain, n/v, diarrhea, brbpr. Awaiting hospice placement  No chest pain,  No new weakness tingling or numbness, No Cough - SOB.    Assessment  & Plan :    Active Problems:   Abdominal aortic aneurysm (AAA) without rupture (HCC)   HTN (hypertension)   GI bleeding   Palliative care by specialist   Goals of care, counseling/discussion     AAA (abdominal aortic aneurysm) without rupture  ([place]), 10 cm -Patient presented with abdominal pain, heme positive tarry stools.  -CT scan at [facility] showed approximately 10 cm abdominal aortic aneurysm. -Vascular surgery was consulted. He was  previously seen by Dr. Kellie Simmering in 2013 and patient had refused aneurysm repair at that time, was not felt a candidate for open repair.  -per vascular not a candidate for intervention - palliative medicine also has been consulted for goals of care -family would like hospice house in [place]  Diarrhea, resolved possible GI bleed, Hgb stable -Possibly due to laxative use,FOBT positive -pt and family not interested at this time in GI intervention obviously at  high risk for invasive GI interventions - continue PPI=> change to PO (6/22) - check cbc in am  Anemia Cont ferrous sulfate 325mg  po qday  HTN (hypertension) -BP currently stable continue metoprolol XL 50mg  po qday Add amlodipine 2.5mg  po qday (6/22) hydralazine as needed -check cmp in am  Hyperlipedema -Continue atorvastatin 40mg  po qhs  Headache Tramadol 50mg  po q6h prn   Anxiety Start ativan 0.5mg  iv q8h prn (6/22)  Severe protein calorie malnutrition prostat 73mL po bid  Insomnia Ambien 5mg  po qhs prn       Code Status : DNR  Family Communication  : w patient, spoke with son yesterday  Disposition Plan  : Hospice House in [place] if possible,  Social work consulted  Barriers For Discharge :   Consults  :  Palliative care, Vascular surgery  Procedures  : none  DVT Prophylaxis  :  SCDs  Lab Results  Component Value Date   PLT 105 (L) 12/29/2017    Antibiotics  :  none  Anti-infectives (From admission, onward)   None        Objective:   Vitals:   12/28/17 0415 12/28/17 1234 12/28/17 1955 12/29/17 0524  BP: (!) 155/64 (!) 149/83 (!) 156/64 (!) 165/76  Pulse: 73 84 74 77  Resp: (!) 21 (!) 25 (!) 21 15  Temp: 98 F (36.7 C) 97.7 F (36.5 C) 98.1 F (36.7 C) 98.1 F (36.7 C)    TempSrc: Oral Oral Oral Oral  SpO2: 95% 99% 94% 97%  Weight: 64.1 kg (141 lb 5 oz)     Height:        Wt Readings from Last 3 Encounters:  12/28/17 64.1 kg (141 lb 5 oz)  12/05/16 63.5 kg (140 lb)  10/25/15 61.2 kg (135 lb)     Intake/Output Summary (Last 24 hours) at 12/29/2017 0651 Last data filed at 12/29/2017 0500 Gross per 24 hour  Intake -  Output 1200 ml  Net -1200 ml     Physical Exam  Awake Alert, Oriented X 3, No new F.N deficits, Normal affect [place].AT,PERRAL Supple Neck,No JVD, No cervical lymphadenopathy appriciated.  Symmetrical Chest wall movement, Good air movement bilaterally, CTAB RRR,No Gallops,Rubs or new Murmurs, No Parasternal Heave +ve B.Sounds, Abd Soft, No tenderness, No organomegaly appriciated, No rebound - guarding or rigidity. No Cyanosis, Clubbing or edema, No new Rash or bruise   Condom cath is off this am    Data Review:    CBC Recent Labs  Lab 12/26/17 0515 12/26/17 1834 12/27/17 0626 12/27/17 1839 12/28/17 0611 12/29/17 0347  WBC 13.1* 11.8* 8.9 7.5 6.8 6.1  HGB 10.1* 10.3* 9.5* 9.6* 9.6* 9.5*  HCT 29.5* 30.0* 28.3* 29.1* 29.7* 28.6*  PLT 116* 125* 100* 91* 102* 105*  MCV 103.5* 100.3* 105.2* 102.5* 100.0 100.0  MCH 35.4* 34.4* 35.3* 33.8 32.3 33.2  MCHC 34.2 34.3 33.6 33.0 32.3 33.2  RDW 16.1* 14.9 16.1* 14.5 14.0 13.7  LYMPHSABS 0.8  --   --   --   --   --  MONOABS 0.7  --   --   --   --   --   EOSABS 0.3  --   --   --   --   --   BASOSABS 0.0  --   --   --   --   --     Chemistries  Recent Labs  Lab 12/26/17 0515 12/27/17 0626 12/29/17 0347  NA 138 140 139  K 4.1 4.3 4.0  CL 107 108 106  CO2 24 23 25   GLUCOSE 95 81 110*  BUN 20 20 9   CREATININE 1.10 0.97 0.81  CALCIUM 8.8* 8.4* 8.2*  AST 96*  --  78*  ALT 44  --  56  ALKPHOS 96  --  155*  BILITOT 0.8  --  0.7   ------------------------------------------------------------------------------------------------------------------ No results for input(s):  CHOL, HDL, LDLCALC, TRIG, CHOLHDL, LDLDIRECT in the last 72 hours.  No results found for: HGBA1C ------------------------------------------------------------------------------------------------------------------ No results for input(s): TSH, T4TOTAL, T3FREE, THYROIDAB in the last 72 hours.  Invalid input(s): FREET3 ------------------------------------------------------------------------------------------------------------------ No results for input(s): VITAMINB12, FOLATE, FERRITIN, TIBC, IRON, RETICCTPCT in the last 72 hours.  Coagulation profile Recent Labs  Lab 12/26/17 0645  INR 1.26    No results for input(s): DDIMER in the last 72 hours.  Cardiac Enzymes No results for input(s): CKMB, TROPONINI, MYOGLOBIN in the last 168 hours.  Invalid input(s): CK ------------------------------------------------------------------------------------------------------------------ No results found for: BNP  Inpatient Medications  Scheduled Meds: . amLODipine  2.5 mg Oral Daily  . atorvastatin  40 mg Oral q1800  . feeding supplement (PRO-STAT SUGAR FREE 64)  30 mL Oral BID  . ferrous sulfate  325 mg Oral Daily  . metoprolol succinate  50 mg Oral Daily  . nicotine  21 mg Transdermal Daily  . pantoprazole  40 mg Intravenous Q12H  . sodium chloride flush  3 mL Intravenous Q12H   Continuous Infusions: . lactated ringers 75 mL/hr at 12/28/17 1939   PRN Meds:.acetaminophen **OR** acetaminophen, alum & mag hydroxide-simeth, hydrALAZINE, loperamide, morphine injection, ondansetron **OR** ondansetron (ZOFRAN) IV, traMADol, zolpidem  Micro Results No results found for this or any previous visit (from the past 240 hour(s)).  Radiology Reports Dg Chest Port 1 View  Result Date: 12/26/2017 CLINICAL DATA:  Headache and chest pain for 3 days. EXAM: PORTABLE CHEST 1 VIEW COMPARISON:  03/31/2015 FINDINGS: Postoperative changes in the mediastinum. Heart size and pulmonary vascularity are normal.  Lungs appear clear and expanded. No blunting of costophrenic angles. No pneumothorax. Calcification of the aorta. Degenerative changes in the spine and shoulders. IMPRESSION: No evidence of active pulmonary disease.  Aortic atherosclerosis. Electronically Signed   By: Lucienne Capers M.D.   On: 12/26/2017 05:53    Time Spent in minutes  30   Jani Gravel M.D on 12/29/2017 at 6:51 AM  Between 7am to 7pm - Pager - [phone]    After 7pm go to [email or web address] - password [email or web address]  [facility] -  Office  [phone]

## 2017-12-30 DIAGNOSIS — I714 Abdominal aortic aneurysm, without rupture: Principal | ICD-10-CM

## 2017-12-30 DIAGNOSIS — Z7189 Other specified counseling: Secondary | ICD-10-CM

## 2017-12-30 DIAGNOSIS — Z515 Encounter for palliative care: Secondary | ICD-10-CM

## 2017-12-30 DIAGNOSIS — K591 Functional diarrhea: Secondary | ICD-10-CM

## 2017-12-30 LAB — COMPREHENSIVE METABOLIC PANEL
ALK PHOS: 131 U/L — AB (ref 38–126)
ALT: 35 U/L (ref 17–63)
AST: 30 U/L (ref 15–41)
Albumin: 2.6 g/dL — ABNORMAL LOW (ref 3.5–5.0)
Anion gap: 6 (ref 5–15)
BUN: 11 mg/dL (ref 6–20)
CALCIUM: 8.3 mg/dL — AB (ref 8.9–10.3)
CO2: 27 mmol/L (ref 22–32)
CREATININE: 0.89 mg/dL (ref 0.61–1.24)
Chloride: 105 mmol/L (ref 101–111)
GFR calc non Af Amer: >60 mL/min (ref >60)
GLUCOSE: 105 mg/dL — AB (ref 65–99)
Potassium: 3.5 mmol/L (ref 3.5–5.1)
SODIUM: 138 mmol/L (ref 135–145)
Total Bilirubin: 0.5 mg/dL (ref 0.3–1.2)
Total Protein: 5.4 g/dL — ABNORMAL LOW (ref 6.5–8.1)

## 2017-12-30 LAB — CBC
HEMATOCRIT: 27.5 % — AB (ref 39.0–52.0)
HEMOGLOBIN: 9.3 g/dL — AB (ref 13.0–17.0)
MCH: 33.9 pg (ref 26.0–34.0)
MCHC: 33.8 g/dL (ref 30.0–36.0)
MCV: 100.4 fL — ABNORMAL HIGH (ref 78.0–100.0)
Platelets: 107 10*3/uL — ABNORMAL LOW (ref 150–400)
RBC: 2.74 MIL/uL — AB (ref 4.22–5.81)
RDW: 13.9 % (ref 11.5–15.5)
WBC: 5 10*3/uL (ref 4.0–10.5)

## 2017-12-30 MED ORDER — AMLODIPINE BESYLATE 5 MG PO TABS
5.0000 mg | ORAL_TABLET | Freq: Every day | ORAL | Status: DC
  Administered 2017-12-30 – 2017-12-31 (×2): 5 mg via ORAL
  Filled 2017-12-30: qty 1

## 2017-12-30 NOTE — Progress Notes (Signed)
CSW assisted in faxing referral for home hospice [facility]) as requested by family. Agency will not be able to review referral until Monday 12/31/2017. CM made aware of referral and will f/u.

## 2017-12-30 NOTE — Progress Notes (Signed)
[facility]                                                                              Patient Demographics  [name], is a 84 y.o. male, DOB - February 08, 1929, PIR:[id number]  Admit date - 12/26/2017   Admitting Physician Karmen Bongo, MD  Outpatient Primary MD for the patient is Jani Gravel, MD  Outpatient specialists:   LOS - 4  days   Medical records reviewed and are as summarized below:    Chief Complaint  Patient presents with  . AAA       Brief summary   [name] is a 84 y.o. male with medical history significant of SVT; PAD; CVA; HTN; depression; COPD; CAD; blindness; and AAA presenting with N/V/D.  He forgot what has been happening.  He denies abdominal pain and n/v at this time.  He denies any problems at this time. He is having dark stools concerning for blood.  At the time of my evaluation, he had no c/o pain and no TTP; however, when I spoke with his granddaughter several hours later she reported that he was having recurrent stools and abdominal pain. ED Course:  Seen at [facility] with n/v/d and elevated lactate, AAA 9.7 cm.  Arranged transfer to see Dr. Trula Slade.  Previously refused intervention.  Assessment & Plan    Principal problem   AAA (abdominal aortic aneurysm) without rupture ([place]), 10 cm -Patient presented with abdominal pain, heme positive tarry stools. -CT scan at [facility] showed approximately 10 cm abdominal aortic aneurysm. -Vascular surgery was consulted.  He was previously seen by Dr. Kellie Simmering in 2013 and patient had refused aneurysm repair at that time, was not felt a candidate for open repair.   -Seen by vascular surgery, Dr. Trula Slade, not a candidate for repair for AAA the from open or percutaneous approach -Palliative care was consulted, per family, request for comfort care status, residential hospice, interested in [place], [place] consult placed  Diarrhea, possible GI bleed -Possibly due to laxative use, FOBT positive -Patient  and family not interested in GI interventions, high risk for invasive procedures -H&H currently stable, continue PPI    HTN (hypertension) -Likely elevated, continue metoprolol, increase amlodipine to 5 mg daily, continue hydralazine as needed  Hyper lipedema -Continue atorvastatin  Code Status: DNR DVT Prophylaxis:   SCD's Family Communication: Discussed in detail with the patient, all imaging results, lab results explained to the patient, multiple family members at the bedside.  Disposition Plan: Awaiting social work with residential hospice placement  Time Spent in minutes 25 minutes  Procedures:  None  Consultants:   Vascular surgery Palliative medicine  Antimicrobials:      Medications  Scheduled Meds: . amLODipine  2.5 mg Oral Daily  . atorvastatin  40 mg Oral q1800  . feeding supplement (PRO-STAT SUGAR FREE 64)  30 mL Oral BID  . ferrous sulfate  325 mg Oral Daily  . metoprolol succinate  50 mg Oral Daily  . nicotine  21 mg Transdermal Daily  . pantoprazole  40 mg Oral BID  . sodium chloride flush  3 mL Intravenous Q12H   Continuous  Infusions:  PRN Meds:.acetaminophen **OR** acetaminophen, alum & mag hydroxide-simeth, hydrALAZINE, loperamide, LORazepam, morphine injection, ondansetron **OR** ondansetron (ZOFRAN) IV, traMADol, zolpidem   Antibiotics   Anti-infectives (From admission, onward)   None        Subjective:   [name] was seen and examined today.  Patient denies any specific complaints.  No chest pain, shortness of breath, abdominal pain, nausea vomiting.  BP slightly elevated.  No acute events overnight.  Objective:   Vitals:   12/29/17 0524 12/29/17 1610 12/29/17 2013 12/30/17 0554  BP: (!) 165/76 (!) 158/68 (!) 151/73 (!) 153/87  Pulse: 77 74 74 85  Resp: 15 20 16 19   Temp: 98.1 F (36.7 C)  98.5 F (36.9 C) 98.5 F (36.9 C)  TempSrc: Oral  Oral Oral  SpO2: 97% 98% 98% 98%  Weight:      Height:        Intake/Output  Summary (Last 24 hours) at 12/30/2017 1104 Last data filed at 12/30/2017 0835 Gross per 24 hour  Intake 120 ml  Output 1775 ml  Net -1655 ml     Wt Readings from Last 3 Encounters:  12/28/17 64.1 kg (141 lb 5 oz)  12/05/16 63.5 kg (140 lb)  10/25/15 61.2 kg (135 lb)     Exam General: Alert and oriented x 3, NAD Eyes:  HEENT:  Atraumatic, normocephalic Cardiovascular: S1 S2 auscultated, Regular rate and rhythm. No pedal edema b/l Respiratory: Clear to auscultation bilaterally, no wheezing, rales or rhonchi Gastrointestinal: Soft, nontender, nondistended, + bowel sounds, pulsatile abdominal mass Ext: no pedal edema bilaterally Neuro: no new deficits Musculoskeletal: No digital cyanosis, clubbing Skin: No rashes Psych: Normal affect and demeanor, alert and oriented x3      Data Reviewed:  I have personally reviewed following labs and imaging studies  Micro Results No results found for this or any previous visit (from the past 240 hour(s)).  Radiology Reports Dg Chest Port 1 View  Result Date: 12/26/2017 CLINICAL DATA:  Headache and chest pain for 3 days. EXAM: PORTABLE CHEST 1 VIEW COMPARISON:  03/31/2015 FINDINGS: Postoperative changes in the mediastinum. Heart size and pulmonary vascularity are normal. Lungs appear clear and expanded. No blunting of costophrenic angles. No pneumothorax. Calcification of the aorta. Degenerative changes in the spine and shoulders. IMPRESSION: No evidence of active pulmonary disease.  Aortic atherosclerosis. Electronically Signed   By: Lucienne Capers M.D.   On: 12/26/2017 05:53    Lab Data:  CBC: Recent Labs  Lab 12/26/17 0515 12/26/17 1834 12/27/17 4098 12/27/17 1839 12/28/17 0611 12/29/17 0347  WBC 13.1* 11.8* 8.9 7.5 6.8 6.1  NEUTROABS 11.3*  --   --   --   --   --   HGB 10.1* 10.3* 9.5* 9.6* 9.6* 9.5*  HCT 29.5* 30.0* 28.3* 29.1* 29.7* 28.6*  MCV 103.5* 100.3* 105.2* 102.5* 100.0 100.0  PLT 116* 125* 100* 91* 102* 105*    Basic Metabolic Panel: Recent Labs  Lab 12/26/17 0515 12/27/17 0626 12/29/17 0347  NA 138 140 139  K 4.1 4.3 4.0  CL 107 108 106  CO2 24 23 25   GLUCOSE 95 81 110*  BUN 20 20 9   CREATININE 1.10 0.97 0.81  CALCIUM 8.8* 8.4* 8.2*   GFR: Estimated Creatinine Clearance: 58.3 mL/min (by C-G formula based on SCr of 0.81 mg/dL). Liver Function Tests: Recent Labs  Lab 12/26/17 0515 12/29/17 0347  AST 96* 78*  ALT 44 56  ALKPHOS 96 155*  BILITOT 0.8 0.7  PROT  6.2* 5.1*  ALBUMIN 3.4* 2.6*   No results for input(s): LIPASE, AMYLASE in the last 168 hours. No results for input(s): AMMONIA in the last 168 hours. Coagulation Profile: Recent Labs  Lab 12/26/17 0645  INR 1.26   Cardiac Enzymes: No results for input(s): CKTOTAL, CKMB, CKMBINDEX, TROPONINI in the last 168 hours. BNP (last 3 results) No results for input(s): PROBNP in the last 8760 hours. HbA1C: No results for input(s): HGBA1C in the last 72 hours. CBG: No results for input(s): GLUCAP in the last 168 hours. Lipid Profile: No results for input(s): CHOL, HDL, LDLCALC, TRIG, CHOLHDL, LDLDIRECT in the last 72 hours. Thyroid Function Tests: No results for input(s): TSH, T4TOTAL, FREET4, T3FREE, THYROIDAB in the last 72 hours. Anemia Panel: No results for input(s): VITAMINB12, FOLATE, FERRITIN, TIBC, IRON, RETICCTPCT in the last 72 hours. Urine analysis:    Component Value Date/Time   COLORURINE YELLOW 07/30/2011 0408   APPEARANCEUR CLEAR 07/30/2011 0408   LABSPEC 1.010 07/30/2011 0408   PHURINE 7.5 07/30/2011 0408   GLUCOSEU NEGATIVE 07/30/2011 0408   HGBUR LARGE (A) 07/30/2011 0408   BILIRUBINUR NEGATIVE 07/30/2011 0408   KETONESUR NEGATIVE 07/30/2011 0408   PROTEINUR NEGATIVE 07/30/2011 0408   UROBILINOGEN 0.2 07/30/2011 0408   NITRITE NEGATIVE 07/30/2011 0408   LEUKOCYTESUR NEGATIVE 07/30/2011 0408     Melanie Openshaw M.D. [facility] 12/30/2017, 11:04 AM  Pager: [phone] Between 7am to 7pm -  call Pager - [phone]  After 7pm go to [email or web address] - password [email or web address]  Call night coverage person covering after 7pm

## 2017-12-30 NOTE — Progress Notes (Addendum)
Daily Progress Note   Patient Name: [name]       Date: 12/30/2017 DOB: [birth year]  Age: 84 y.o. MRN#: [id number] Attending Physician: Mendel Corning, MD Primary Care Physician: Jani Gravel, MD Admit Date: 12/26/2017  Reason for Consultation/Follow-up: Establishing goals of care  Subjective: Patient is resting in bed. He states he feels better today than yesterday. Family (Ann and Grant) and an additional family member is present at bedside. They have requested placement at hospice facility in [place] states he has been eating and drinking pretty well. Per RN eating 50% of meals. She states his diarrhea and complaints of abdominal pain have stopped. His HGB has stable.  Discussed his diagnoses and prognosis. Family states they would really like to try to get him into hospice home, but if not, would like hospice at home.  Addendum: Family called back to state they would like home with hospice. CM and SW made aware.  Length of Stay: 4  Current Medications: Scheduled Meds:  . amLODipine  2.5 mg Oral Daily  . atorvastatin  40 mg Oral q1800  . feeding supplement (PRO-STAT SUGAR FREE 64)  30 mL Oral BID  . ferrous sulfate  325 mg Oral Daily  . metoprolol succinate  50 mg Oral Daily  . nicotine  21 mg Transdermal Daily  . pantoprazole  40 mg Oral BID  . sodium chloride flush  3 mL Intravenous Q12H    Continuous Infusions:   PRN Meds: acetaminophen **OR** acetaminophen, alum & mag hydroxide-simeth, hydrALAZINE, loperamide, LORazepam, morphine injection, ondansetron **OR** ondansetron (ZOFRAN) IV, traMADol, zolpidem  Physical Exam  Constitutional: No distress.  Pulmonary/Chest: Effort normal.  Neurological: He is alert.            Vital Signs: BP (!) 153/87 (BP  Location: Left Arm)   Pulse 85   Temp 98.5 F (36.9 C) (Oral)   Resp 19   Ht 5\' 9"  (1.753 m)   Wt 64.1 kg (141 lb 5 oz)   SpO2 98%   BMI 20.87 kg/m  SpO2: SpO2: 98 % O2 Device: O2 Device: Room Air O2 Flow Rate:    Intake/output summary:   Intake/Output Summary (Last 24 hours) at 12/30/2017 1104 Last data filed at 12/30/2017 0835 Gross per 24 hour  Intake 120 ml  Output 1775  ml  Net -1655 ml   LBM: Last BM Date: 12/28/17 Baseline Weight: Weight: 63.5 kg (140 lb) Most recent weight: Weight: 64.1 kg (141 lb 5 oz)       Palliative Assessment/Data: 40%    Flowsheet Rows     Most Recent Value  Intake Tab  Referral Department  Hospitalist  Unit at Time of Referral  Med/Surg Unit  Palliative Care Primary Diagnosis  -- [AAA]  Palliative Care Type  New Palliative care  Reason for referral  Clarify Goals of Care  Date first seen by Palliative Care  12/28/17  Clinical Assessment  Palliative Performance Scale Score  40%  Psychosocial & Spiritual Assessment  Palliative Care Outcomes  Patient/Family meeting held?  Yes  Who was at the meeting?  son, daughter, sister  Palliative Care Outcomes  Clarified goals of care, Counseled regarding hospice, Provided end of life care assistance, ACP counseling assistance, Provided psychosocial or spiritual support      Patient Active Problem List   Diagnosis Date Noted  . Diarrhea 12/29/2017  . Palliative care by specialist   . Goals of care, counseling/discussion   . GI bleeding 12/26/2017  . PAF (paroxysmal atrial fibrillation) ([place]) 03/19/2015  . Intertrochanteric fracture of right femur ([place]) 03/15/2015  . Hip fracture ([place]) 03/14/2015  . Abdominal aortic aneurysm (AAA) without rupture ([place]) 03/14/2015  . HTN (hypertension) 03/14/2015  . Blindness 03/14/2015  . On continuous oral anticoagulation   . ANEMIA, IRON DEFICIENCY, MICROCYTIC 03/17/2010  . HIATAL HERNIA WITH REFLUX 03/17/2010  . DIVERTICULAR BLEEDING, HX OF 03/17/2010      Palliative Care Assessment & Plan   Patient Profile:  84 y.o. male  with past medical history of SVT, PAD, CVA, HTN, COPD, CAD, depression, blindneses, and AAA admitted on 12/26/2017 with nausea, vomiting and diarrhea. Patient has been having dark stools concerning for blood. At [facility], patient found to have elevated lactate and AAA of 9.7cm. Transfer to [facility] to see vascular surgery. Patient seen by Dr. Kellie Simmering in 2013 for AAA. At that time, patient refused aneurysm repair and was not felt to be a good candidate for open repair. Vascular surgery has evaluated this admission and determined he is not a candidate for open surgical repair or endovascular repair. Also FOBT positive with pending GI consult. High risk for GI interventions. Palliative medicine consultation for goals of care.   Assessment/Recommendations/Plan:  Family initially would like to be assessed for residential hospice placement. Family would want home with hospice if this is not possible. Now requesting home with hospice.     Code Status:    Code Status Orders  (From admission, onward)        Start     Ordered   12/26/17 1049  Do not attempt resuscitation (DNR)  Continuous    Question Answer Comment  In the event of cardiac or respiratory ARREST Do not call a "code blue"   In the event of cardiac or respiratory ARREST Do not perform Intubation, CPR, defibrillation or ACLS   In the event of cardiac or respiratory ARREST Use medication by any route, position, wound care, and other measures to relive pain and suffering. May use oxygen, suction and manual treatment of airway obstruction as needed for comfort.      12/26/17 1049    Code Status History    Date Active Date Inactive Code Status Order ID Comments User Context   03/15/2015 1628 03/24/2015 1330 Full Code [id number]  Carole Civil, MD Inpatient  03/14/2015 1309 03/15/2015 1628 Full Code [id number]  Orvan Falconer, MD Inpatient       Prognosis:   Unable to  determine non-op  9.7cm AAA. High risk for acute decompensation/ rupture leading to death.   Discharge Planning:  Home with hospice.   Care plan was discussed with SW  Thank you for allowing the Palliative Medicine Team to assist in the care of this patient.   Total Time 35 min Prolonged Time Billed no      Greater than 50%  of this time was spent counseling and coordinating care related to the above assessment and plan.  Asencion Gowda, NP  Please contact Palliative Medicine Team phone at [phone] for questions and concerns.

## 2017-12-31 MED ORDER — TRAMADOL HCL 50 MG PO TABS: 50.0000 mg | ORAL_TABLET | Freq: Four times a day (QID) | ORAL | 0 refills | Status: AC | PRN

## 2017-12-31 MED ORDER — PANTOPRAZOLE SODIUM 40 MG PO TBEC: 40.0000 mg | DELAYED_RELEASE_TABLET | Freq: Two times a day (BID) | ORAL | 3 refills | Status: AC

## 2017-12-31 NOTE — Discharge Summary (Signed)
Physician Discharge Summary   Patient ID: [name] MRN: [id number] DOB/AGE: 09/02/1928 84 y.o.  Admit date: 12/26/2017 Discharge date: 12/31/2017  Primary Care Physician:  Jani Gravel, MD   Recommendations for Outpatient Follow-up:  1. Follow up with PCP in 1-2 weeks as needed  Home Health: Patient is being discharged home with hospice Equipment/Devices: Hospital bed  Discharge Condition: Guarded CODE STATUS: DNR Diet recommendation: Dysphagia   Discharge Diagnoses:    . AAA (abdominal aortic aneurysm) ([place]) without rupture . HTN (hypertension) . GI bleeding . Diarrhea Hyper lipidemia  Consults:   Palliative medicine Vascular surgery    Allergies:   Allergies  Allergen Reactions  . Ambien [Zolpidem Tartrate] Other (See Comments)    Severe nightmares  . Penicillins Hives    Has patient had a PCN reaction causing immediate rash, facial/tongue/throat swelling, SOB or lightheadedness with hypotension: Unknown Has patient had a PCN reaction causing severe rash involving mucus membranes or skin necrosis: Unknown Has patient had a PCN reaction that required hospitalization: Unknown Has patient had a PCN reaction occurring within the last 10 years: Yes If all of the above answers are "NO", then may proceed with Cephalosporin use.   . Codeine Hives and Palpitations     DISCHARGE MEDICATIONS: Allergies as of 12/31/2017      Reactions   Ambien [zolpidem Tartrate] Other (See Comments)   Severe nightmares   Penicillins Hives   Has patient had a PCN reaction causing immediate rash, facial/tongue/throat swelling, SOB or lightheadedness with hypotension: Unknown Has patient had a PCN reaction causing severe rash involving mucus membranes or skin necrosis: Unknown Has patient had a PCN reaction that required hospitalization: Unknown Has patient had a PCN reaction occurring within the last 10 years: Yes If all of the above answers are "NO", then may proceed with  Cephalosporin use.   Codeine Hives, Palpitations      Medication List    STOP taking these medications   aspirin EC 81 MG tablet   folic acid 330 MCG tablet Commonly known as:  FOLVITE   lisinopril 40 MG tablet Commonly known as:  PRINIVIL,ZESTRIL   multivitamin with minerals Tabs tablet   vitamin C 1000 MG tablet     TAKE these medications   acetaminophen 500 MG tablet Commonly known as:  TYLENOL Take 1,000 mg by mouth every 6 (six) hours as needed for mild pain.   amLODipine 5 MG tablet Commonly known as:  NORVASC Take 7.5 mg by mouth daily.   diphenhydrAMINE 25 mg capsule Commonly known as:  BENADRYL Take 25 mg by mouth at bedtime.   ferrous sulfate 325 (65 FE) MG tablet Take 325 mg by mouth daily.   loperamide 2 MG capsule Commonly known as:  IMODIUM Take 1 capsule (2 mg total) by mouth as needed for diarrhea or loose stools.   metoprolol succinate 50 MG 24 hr tablet Commonly known as:  TOPROL-XL Take 50-100 mg by mouth See admin instructions. 100mg  in the morning and 50mg  in the evening.Take with or immediately following a meal.   pantoprazole 40 MG tablet Commonly known as:  PROTONIX Take 1 tablet (40 mg total) by mouth 2 (two) times daily.   simvastatin 40 MG tablet Commonly known as:  ZOCOR Take 40 mg by mouth daily. For cholesterol   traMADol 50 MG tablet Commonly known as:  ULTRAM Take 1 tablet (50 mg total) by mouth every 6 (six) hours as needed for severe pain (if tylenol not working).  Brief H and P: For complete details please refer to admission H and P, but in brief  Jaysion Ramseyer Mooreis a 84 y.o.malewith medical history significant ofSVT; PAD; CVA; HTN; depression; COPD; CAD;blindness;and AAA presenting with N/V/D.He forgot what has been happening. He denies abdominal pain and n/v at this time. He denies any problems at this time. He is having dark stools concerning for blood. At the time of my evaluation, he had no c/o pain  and no TTP; however, when I spoke with his granddaughter several hours later she reported that he was having recurrent stools and abdominal pain. ED Course:Seen at [facility] with n/v/d and elevated lactate, AAA 9.7 cm. Arranged transfer to see Dr. Trula Slade. Previously refused intervention.   Hospital Course:   AAA (abdominal aortic aneurysm) without rupture ([place]), 10 cm -Patient presented with abdominal pain, heme positive tarry stools. -CT scan at [facility] showed approximately 10 cm abdominal aortic aneurysm. -Vascular surgery was consulted.  He was previously seen by Dr. Kellie Simmering in 2013 and patient had refused aneurysm repair at that time, was not felt a candidate for open repair.   -Seen by vascular surgery, Dr. Trula Slade, not a candidate for repair for AAA from open or percutaneous approach -Palliative care was consulted, per family, request for comfort care status.  Initially requested for residential hospice then home with hospice.  Diarrhea, possible GI bleed -Possibly due to laxative use, FOBT positive -Patient and family not interested in GI interventions, high risk for invasive procedures -H&H currently stable, continue PPI    HTN (hypertension) -Likely elevated, continue metoprolol, amlodipine 7.5 mg daily  Hyper lipedema -Continue atorvastatin    Day of Discharge S: Alert and awake, no acute complaints  BP (!) 158/78   Pulse 85   Temp 98.8 F (37.1 C) (Oral)   Resp (!) 22   Ht 5\' 9"  (1.753 m)   Wt 64.1 kg (141 lb 5 oz)   SpO2 98%   BMI 20.87 kg/m   Physical Exam: General: Alert and awake oriented x3 not in any acute distress. HEENT: anicteric sclera, pupils reactive to light and accommodation CVS: S1-S2 clear no murmur rubs or gallops Chest: clear to auscultation bilaterally, no wheezing rales or rhonchi Abdomen: soft nontender, pulsatile abdominal mass normal bowel sounds Extremities: no cyanosis, clubbing or edema noted bilaterally Neuro: no new  deficit   The results of significant diagnostics from this hospitalization (including imaging, microbiology, ancillary and laboratory) are listed below for reference.      Procedures/Studies:  Dg Chest Port 1 View  Result Date: 12/26/2017 CLINICAL DATA:  Headache and chest pain for 3 days. EXAM: PORTABLE CHEST 1 VIEW COMPARISON:  03/31/2015 FINDINGS: Postoperative changes in the mediastinum. Heart size and pulmonary vascularity are normal. Lungs appear clear and expanded. No blunting of costophrenic angles. No pneumothorax. Calcification of the aorta. Degenerative changes in the spine and shoulders. IMPRESSION: No evidence of active pulmonary disease.  Aortic atherosclerosis. Electronically Signed   By: Lucienne Capers M.D.   On: 12/26/2017 05:53       LAB RESULTS: Basic Metabolic Panel: Recent Labs  Lab 12/29/17 0347 12/30/17 0952  NA 139 138  K 4.0 3.5  CL 106 105  CO2 25 27  GLUCOSE 110* 105*  BUN 9 11  CREATININE 0.81 0.89  CALCIUM 8.2* 8.3*   Liver Function Tests: Recent Labs  Lab 12/29/17 0347 12/30/17 0952  AST 78* 30  ALT 56 35  ALKPHOS 155* 131*  BILITOT 0.7 0.5  PROT 5.1*  5.4*  ALBUMIN 2.6* 2.6*   No results for input(s): LIPASE, AMYLASE in the last 168 hours. No results for input(s): AMMONIA in the last 168 hours. CBC: Recent Labs  Lab 12/26/17 0515  12/29/17 0347 12/30/17 0952  WBC 13.1*   < > 6.1 5.0  NEUTROABS 11.3*  --   --   --   HGB 10.1*   < > 9.5* 9.3*  HCT 29.5*   < > 28.6* 27.5*  MCV 103.5*   < > 100.0 100.4*  PLT 116*   < > 105* 107*   < > = values in this interval not displayed.   Cardiac Enzymes: No results for input(s): CKTOTAL, CKMB, CKMBINDEX, TROPONINI in the last 168 hours. BNP: Invalid input(s): POCBNP CBG: No results for input(s): GLUCAP in the last 168 hours.    Disposition and Follow-up: Discharge Instructions    Increase activity slowly   Complete by:  As directed        DISPOSITION: Home with  hospice   DISCHARGE FOLLOW-UP Follow-up Information    Jani Gravel, MD. Schedule an appointment as soon as possible for a visit in 2 week(s).   Specialty:  Internal Medicine Contact information: [address] [place] [address] [phone]            Time coordinating discharge:  25 minutes  Signed:   Estill Cotta M.D. [facility] 12/31/2017, 1:16 PM Pager: [phone]

## 2017-12-31 NOTE — Care Management Note (Signed)
Case Management Note Marvetta Gibbons RN, BSN Unit 4E-Case Manager [phone]  Patient Details  Name: [name] MRN: [id number] Date of Birth: October 26, 1928  Subjective/Objective:   Pt admitted with AAA, vascular consulted pt not a candidate for repair- PC consulted- plan for home with hospice                Action/Plan: PTA pt lived at home-noted CSW note from weekend that referral had been made to [facility]- call made to [place] and confirmed that they had received referral from weekend- per [place]. - PC note faxed per request of [place]- d/c summary also faxed per request- to [phone]. Pt's son will transport home via private car. No further CM needs noted for transition to home with home hospice.   Expected Discharge Date:  12/31/17               Expected Discharge Plan:  Home w Hospice Care  In-House Referral:  NA  Discharge planning Services  CM Consult  Post Acute Care Choice:  Hospice Choice offered to:  Adult Children  DME Arranged:  Hospital bed DME Agency:     HH Arranged:  Disease Management [place] Agency:  [facility]  Status of Service:  Completed, signed off  If discussed at [place] of Stay Meetings, dates discussed:    Discharge Disposition: Home/home hospice   Additional Comments:  Dawayne Patricia, RN 12/31/2017, 2:36 PM

## 2017-12-31 NOTE — Progress Notes (Signed)
Spoke with Larey Seat with [facility] this AM- confirmed that they have received Home Hospice referral over weekend. Larey Seat has spoken with pt's son this morning- working on home DME arrangements for home hospital bed. Son has # for Hospice to call when they arrive home with patient. Will need Gold DNR to transport home with pt. CM will continue to follow for transition of care needs for home Hospice. Will fax d/c summary when available to [facility]- (fax # [phone])

## 2019-04-22 DIAGNOSIS — R040 Epistaxis: Secondary | ICD-10-CM | POA: Diagnosis not present

## 2019-04-25 DIAGNOSIS — Z880 Allergy status to penicillin: Secondary | ICD-10-CM | POA: Diagnosis not present

## 2019-04-25 DIAGNOSIS — R040 Epistaxis: Secondary | ICD-10-CM | POA: Diagnosis not present

## 2019-04-25 DIAGNOSIS — Z79899 Other long term (current) drug therapy: Secondary | ICD-10-CM | POA: Diagnosis not present

## 2019-04-25 DIAGNOSIS — Z8673 Personal history of transient ischemic attack (TIA), and cerebral infarction without residual deficits: Secondary | ICD-10-CM | POA: Diagnosis not present

## 2019-04-25 DIAGNOSIS — I119 Hypertensive heart disease without heart failure: Secondary | ICD-10-CM | POA: Diagnosis not present

## 2019-04-25 DIAGNOSIS — Z951 Presence of aortocoronary bypass graft: Secondary | ICD-10-CM | POA: Diagnosis not present

## 2019-04-25 DIAGNOSIS — H548 Legal blindness, as defined in USA: Secondary | ICD-10-CM | POA: Diagnosis not present

## 2019-04-30 DIAGNOSIS — R040 Epistaxis: Secondary | ICD-10-CM | POA: Diagnosis not present

## 2020-01-08 DEATH — deceased
